# Patient Record
Sex: Female | Born: 1965 | Race: White | Hispanic: No | Marital: Single | Smoking: Never smoker
Health system: Southern US, Community
[De-identification: ages and names within clinical notes are randomized; demographics above are authoritative.]

## PROBLEM LIST (undated history)

## (undated) DIAGNOSIS — D649 Anemia, unspecified: Secondary | ICD-10-CM

## (undated) DIAGNOSIS — F419 Anxiety disorder, unspecified: Secondary | ICD-10-CM

## (undated) DIAGNOSIS — I639 Cerebral infarction, unspecified: Secondary | ICD-10-CM

## (undated) DIAGNOSIS — F410 Panic disorder [episodic paroxysmal anxiety] without agoraphobia: Secondary | ICD-10-CM

## (undated) DIAGNOSIS — F32A Depression, unspecified: Secondary | ICD-10-CM

## (undated) DIAGNOSIS — Z8489 Family history of other specified conditions: Secondary | ICD-10-CM

## (undated) DIAGNOSIS — E559 Vitamin D deficiency, unspecified: Secondary | ICD-10-CM

## (undated) DIAGNOSIS — M797 Fibromyalgia: Secondary | ICD-10-CM

## (undated) DIAGNOSIS — G4700 Insomnia, unspecified: Secondary | ICD-10-CM

## (undated) DIAGNOSIS — F329 Major depressive disorder, single episode, unspecified: Secondary | ICD-10-CM

## (undated) DIAGNOSIS — I10 Essential (primary) hypertension: Secondary | ICD-10-CM

## (undated) DIAGNOSIS — R5382 Chronic fatigue, unspecified: Secondary | ICD-10-CM

## (undated) DIAGNOSIS — F5104 Psychophysiologic insomnia: Secondary | ICD-10-CM

## (undated) HISTORY — DX: Fibromyalgia: M79.7

## (undated) HISTORY — DX: Panic disorder (episodic paroxysmal anxiety): F41.0

## (undated) HISTORY — PX: PARATHYROIDECTOMY: SHX19

## (undated) HISTORY — DX: Vitamin D deficiency, unspecified: E55.9

## (undated) HISTORY — PX: ABDOMINAL HYSTERECTOMY: SHX81

## (undated) HISTORY — DX: Chronic fatigue, unspecified: R53.82

## (undated) HISTORY — DX: Cerebral infarction, unspecified: I63.9

## (undated) HISTORY — DX: Anxiety disorder, unspecified: F41.9

## (undated) HISTORY — DX: Psychophysiologic insomnia: F51.04

## (undated) HISTORY — DX: Insomnia, unspecified: G47.00

## (undated) HISTORY — PX: PARTIAL HYSTERECTOMY: SHX80

---

## 1998-05-08 ENCOUNTER — Other Ambulatory Visit: Admission: RE | Admit: 1998-05-08 | Discharge: 1998-05-08 | Payer: Self-pay | Admitting: Obstetrics & Gynecology

## 2002-06-14 ENCOUNTER — Emergency Department (HOSPITAL_COMMUNITY): Admission: EM | Admit: 2002-06-14 | Discharge: 2002-06-15 | Payer: Self-pay | Admitting: Emergency Medicine

## 2007-01-15 ENCOUNTER — Ambulatory Visit: Payer: Self-pay

## 2007-02-02 ENCOUNTER — Ambulatory Visit: Payer: Self-pay | Admitting: Internal Medicine

## 2007-02-03 ENCOUNTER — Ambulatory Visit: Payer: Self-pay | Admitting: Internal Medicine

## 2007-03-06 ENCOUNTER — Ambulatory Visit: Payer: Self-pay | Admitting: Internal Medicine

## 2007-04-05 ENCOUNTER — Ambulatory Visit: Payer: Self-pay | Admitting: Internal Medicine

## 2007-05-06 ENCOUNTER — Ambulatory Visit: Payer: Self-pay | Admitting: Internal Medicine

## 2007-06-05 ENCOUNTER — Ambulatory Visit: Payer: Self-pay | Admitting: Internal Medicine

## 2007-07-06 ENCOUNTER — Ambulatory Visit: Payer: Self-pay | Admitting: Internal Medicine

## 2007-08-06 ENCOUNTER — Ambulatory Visit: Payer: Self-pay | Admitting: Internal Medicine

## 2007-09-05 ENCOUNTER — Ambulatory Visit: Payer: Self-pay | Admitting: Internal Medicine

## 2007-10-06 ENCOUNTER — Ambulatory Visit: Payer: Self-pay | Admitting: Internal Medicine

## 2007-10-10 ENCOUNTER — Ambulatory Visit: Payer: Self-pay | Admitting: Unknown Physician Specialty

## 2007-10-16 ENCOUNTER — Inpatient Hospital Stay: Payer: Self-pay | Admitting: Unknown Physician Specialty

## 2007-11-05 ENCOUNTER — Ambulatory Visit: Payer: Self-pay | Admitting: Internal Medicine

## 2007-12-06 ENCOUNTER — Ambulatory Visit: Payer: Self-pay | Admitting: Internal Medicine

## 2009-01-05 ENCOUNTER — Ambulatory Visit: Payer: Self-pay | Admitting: Internal Medicine

## 2009-01-15 ENCOUNTER — Ambulatory Visit: Payer: Self-pay | Admitting: Internal Medicine

## 2009-02-02 ENCOUNTER — Ambulatory Visit: Payer: Self-pay | Admitting: Internal Medicine

## 2009-03-05 ENCOUNTER — Ambulatory Visit: Payer: Self-pay | Admitting: Internal Medicine

## 2009-04-04 ENCOUNTER — Ambulatory Visit: Payer: Self-pay | Admitting: Internal Medicine

## 2010-01-13 ENCOUNTER — Ambulatory Visit: Payer: Self-pay | Admitting: Family Medicine

## 2010-04-28 ENCOUNTER — Ambulatory Visit: Payer: Self-pay | Admitting: Unknown Physician Specialty

## 2010-06-14 ENCOUNTER — Ambulatory Visit: Payer: Self-pay | Admitting: Family Medicine

## 2010-06-29 ENCOUNTER — Encounter: Payer: Self-pay | Admitting: Family Medicine

## 2010-07-05 ENCOUNTER — Encounter: Payer: Self-pay | Admitting: Family Medicine

## 2010-08-05 ENCOUNTER — Encounter: Payer: Self-pay | Admitting: Family Medicine

## 2011-01-28 ENCOUNTER — Other Ambulatory Visit: Payer: Self-pay | Admitting: *Deleted

## 2011-01-28 ENCOUNTER — Ambulatory Visit
Admission: RE | Admit: 2011-01-28 | Discharge: 2011-01-28 | Disposition: A | Discharge status: Home / Self Care | Payer: 59 | Source: Ambulatory Visit | Attending: *Deleted | Admitting: *Deleted

## 2011-01-28 DIAGNOSIS — M545 Low back pain, unspecified: Secondary | ICD-10-CM

## 2011-01-28 DIAGNOSIS — M797 Fibromyalgia: Secondary | ICD-10-CM

## 2011-01-28 DIAGNOSIS — M542 Cervicalgia: Secondary | ICD-10-CM

## 2012-01-14 ENCOUNTER — Ambulatory Visit: Payer: Self-pay

## 2012-01-30 ENCOUNTER — Ambulatory Visit: Payer: Self-pay

## 2012-02-13 ENCOUNTER — Ambulatory Visit: Payer: Self-pay

## 2012-02-23 ENCOUNTER — Emergency Department: Payer: Self-pay | Admitting: Emergency Medicine

## 2012-02-23 LAB — COMPREHENSIVE METABOLIC PANEL
Albumin: 4.1 g/dL (ref 3.4–5.0)
Alkaline Phosphatase: 60 U/L (ref 50–136)
BUN: 9 mg/dL (ref 7–18)
Creatinine: 0.7 mg/dL (ref 0.60–1.30)
EGFR (African American): >60
Glucose: 116 mg/dL — ABNORMAL HIGH (ref 65–99)
SGOT(AST): 17 U/L (ref 15–37)
SGPT (ALT): 26 U/L
Sodium: 134 mmol/L — ABNORMAL LOW (ref 136–145)
Total Protein: 9.3 g/dL — ABNORMAL HIGH (ref 6.4–8.2)

## 2012-02-23 LAB — URINALYSIS, COMPLETE
Bilirubin,UR: NEGATIVE
Nitrite: NEGATIVE
Ph: 5 (ref 4.5–8.0)
Protein: 100
RBC,UR: <1 /HPF (ref 0–5)
WBC UR: <1 /HPF (ref 0–5)

## 2012-02-23 LAB — CBC
MCH: 24 pg — ABNORMAL LOW (ref 26.0–34.0)
MCHC: 32.5 g/dL (ref 32.0–36.0)
MCV: 74 fL — ABNORMAL LOW (ref 80–100)
Platelet: 455 10*3/uL — ABNORMAL HIGH (ref 150–440)
RBC: 5.43 10*6/uL — ABNORMAL HIGH (ref 3.80–5.20)

## 2012-03-12 ENCOUNTER — Ambulatory Visit: Payer: Self-pay

## 2012-12-31 ENCOUNTER — Ambulatory Visit: Payer: Self-pay | Admitting: Family Medicine

## 2013-10-28 ENCOUNTER — Ambulatory Visit: Payer: Self-pay | Admitting: Family Medicine

## 2013-11-04 ENCOUNTER — Ambulatory Visit: Payer: Self-pay | Admitting: Family Medicine

## 2014-01-02 ENCOUNTER — Ambulatory Visit: Payer: Self-pay | Admitting: Family Medicine

## 2014-04-22 ENCOUNTER — Inpatient Hospital Stay: Payer: Self-pay | Admitting: Internal Medicine

## 2014-04-22 LAB — CBC WITH DIFFERENTIAL/PLATELET
BASOS ABS: 0.1 10*3/uL (ref 0.0–0.1)
Basophil %: 1.2 %
EOS ABS: 0.7 10*3/uL (ref 0.0–0.7)
Eosinophil %: 8.7 %
HCT: 38.7 % (ref 35.0–47.0)
HGB: 12.6 g/dL (ref 12.0–16.0)
LYMPHS ABS: 1.1 10*3/uL (ref 1.0–3.6)
Lymphocyte %: 13.6 %
MCH: 26.6 pg (ref 26.0–34.0)
MCHC: 32.6 g/dL (ref 32.0–36.0)
MCV: 82 fL (ref 80–100)
MONOS PCT: 7.2 %
Monocyte #: 0.6 x10 3/mm (ref 0.2–0.9)
NEUTROS ABS: 5.4 10*3/uL (ref 1.4–6.5)
NEUTROS PCT: 69.3 %
PLATELETS: 294 10*3/uL (ref 150–440)
RBC: 4.74 10*6/uL (ref 3.80–5.20)
RDW: 15.4 % — ABNORMAL HIGH (ref 11.5–14.5)
WBC: 7.8 10*3/uL (ref 3.6–11.0)

## 2014-04-22 LAB — BASIC METABOLIC PANEL
Anion Gap: 4 — ABNORMAL LOW (ref 7–16)
BUN: 9 mg/dL (ref 7–18)
CALCIUM: 10.7 mg/dL — AB (ref 8.5–10.1)
CHLORIDE: 103 mmol/L (ref 98–107)
CO2: 30 mmol/L (ref 21–32)
Creatinine: 0.73 mg/dL (ref 0.60–1.30)
Glucose: 115 mg/dL — ABNORMAL HIGH (ref 65–99)
OSMOLALITY: 273 (ref 275–301)
POTASSIUM: 3.5 mmol/L (ref 3.5–5.1)
Sodium: 137 mmol/L (ref 136–145)

## 2014-04-22 LAB — TROPONIN I

## 2014-04-23 DIAGNOSIS — I517 Cardiomegaly: Secondary | ICD-10-CM

## 2014-04-23 LAB — TSH: THYROID STIMULATING HORM: 1.52 u[IU]/mL

## 2014-04-23 LAB — LIPID PANEL
Cholesterol: 151 mg/dL (ref 0–200)
HDL: 68 mg/dL — AB (ref 40–60)
Ldl Cholesterol, Calc: 65 mg/dL (ref 0–100)
Triglycerides: 88 mg/dL (ref 0–200)
VLDL Cholesterol, Calc: 18 mg/dL (ref 5–40)

## 2014-04-23 LAB — HEMOGLOBIN A1C: HEMOGLOBIN A1C: 6.1 % (ref 4.2–6.3)

## 2014-04-23 LAB — SEDIMENTATION RATE: Erythrocyte Sed Rate: 3 mm/hr (ref 0–20)

## 2014-04-24 ENCOUNTER — Encounter: Payer: Self-pay | Admitting: Cardiovascular Disease

## 2014-04-24 DIAGNOSIS — I6350 Cerebral infarction due to unspecified occlusion or stenosis of unspecified cerebral artery: Secondary | ICD-10-CM

## 2014-04-24 DIAGNOSIS — I10 Essential (primary) hypertension: Secondary | ICD-10-CM

## 2014-04-25 ENCOUNTER — Telehealth: Payer: Self-pay

## 2014-04-25 NOTE — Telephone Encounter (Signed)
Attempted to call pt to change appt to Dr. Graciela Husbands, no answer

## 2014-04-25 NOTE — Telephone Encounter (Signed)
Message copied by Coralee Rud on Fri Apr 25, 2014  4:33 PM ------      Message from: Lorine Bears A      Created: Thu Apr 24, 2014  4:29 PM       She is scheduled to see me for follow up. Please change that to Dr. Graciela Husbands as a consult. She had a stroke. Neurologist recommended a loop recorder to evaluate for A-fib.  ------

## 2014-04-29 NOTE — Telephone Encounter (Signed)
Na to change appt to Dr. Graciela Husbands.

## 2014-05-01 ENCOUNTER — Telehealth: Payer: Self-pay | Admitting: *Deleted

## 2014-05-01 NOTE — Telephone Encounter (Signed)
LVM 5/28

## 2014-05-01 NOTE — Telephone Encounter (Signed)
Patient stated she does not with to be seen at this time  She stated that she would call us if she wanted appt

## 2014-05-01 NOTE — Telephone Encounter (Signed)
Please call patient to explain why she needs to see Dr. Graciela Husbands.  In the hospital the doctor told her that her heart was good.

## 2014-05-02 ENCOUNTER — Encounter: Payer: 59 | Admitting: Cardiovascular Disease

## 2014-06-25 ENCOUNTER — Telehealth: Payer: Self-pay

## 2014-06-25 NOTE — Telephone Encounter (Signed)
Pt is confused on why she needs to see Dr. Graciela HusbandsKlein, states she was told in the hospital her heart was fine. Please call.

## 2014-06-26 NOTE — Telephone Encounter (Signed)
Spoke w/ Victoria Guzman.  [place] and wanted to make sure that she has all of the correct info when she transitions care.  She states that she will not wear any type of monitor, as they are bulky, she has worn them before and she cannot wear her sundresses b/c of the "burn marks" they leave on her skin.  She states that she will not wear a loop recorder, as she does not want anything injected into her or have any kind of surgery.  She states that Dr. Kirke CorinArida told her that her heart was fine and she feels that the other doctors are trying to add more tests and meds than she needs.  Victoria Guzman states that our office cancelled her TCM appt w/ Dr. Kirke CorinArida and that she feels she needs to talk to him before she moves out of the state on 07/19/14. She would like to make an appt, but wants to make sure that there will be no charge for this appt, as it should be considered lumped w/ her hospital visit, b/c our office cancelled the appt.  Please advise and let Victoria Guzman know if she can get an appt to see Dr.Arida before the moves.

## 2014-06-26 NOTE — Telephone Encounter (Signed)
Left message for pt to call back.  [facility] w/ instructions to f/u w/ Dr. Kirke CorinArida for TCM, which pt did not show for.  Dr. Jari SportsmanArida's consultation indicates that he would pt to have a loop recorder to determine if pt has underlying tachycardia, but no appt was sched to see Dr. Graciela HusbandsKlein.

## 2014-07-17 ENCOUNTER — Encounter: Payer: Self-pay | Admitting: Cardiovascular Disease

## 2014-07-17 ENCOUNTER — Ambulatory Visit (INDEPENDENT_AMBULATORY_CARE_PROVIDER_SITE_OTHER): Payer: Medicare HMO | Admitting: Cardiovascular Disease

## 2014-07-17 VITALS — BP 180/120 | HR 104 | Ht 66.0 in | Wt 213.2 lb

## 2014-07-17 DIAGNOSIS — I6340 Cerebral infarction due to embolism of unspecified cerebral artery: Secondary | ICD-10-CM

## 2014-07-17 DIAGNOSIS — I639 Cerebral infarction, unspecified: Secondary | ICD-10-CM | POA: Insufficient documentation

## 2014-07-17 DIAGNOSIS — R0602 Shortness of breath: Secondary | ICD-10-CM

## 2014-07-17 NOTE — Patient Instructions (Signed)
Your physician recommends that you schedule a follow-up appointment in:  As needed  Your physician recommends that you continue on your current medications as directed. Please refer to the Current Medication list given to you today.  

## 2014-07-17 NOTE — Assessment & Plan Note (Signed)
Transthoracic echo and TEE were both unremarkable. I agree with evaluation for possible paroxysmal atrial fibrillation as a source. I recommended either a 30 day outpatient telemetry or a loop recorder . The patient did not want to have any of the 2 test as [place] to [place] in the near future. I advised him to establish with a primary care physician today. Blood pressure has been running high but according to her is on the high when she's under stress. Continue to monitor and consider treatment.

## 2014-07-17 NOTE — Progress Notes (Signed)
Primary care physician: Dr. Thana AtesMorrisey  HPI  This is a 48 year old African American female who is here today for a followup visit after recent hospitalization. She has no history of fibromyalgia, chronic pain syndrome and elevated blood pressure. She presented in May with right-sided numbness and weakness. She was significantly hypertensive on presentation with blood pressure of 200/82. MRI showed a small left MCA stroke which was suspected to be embolic in etiology. She underwent a transesophageal echocardiogram which showed no cardiac source of embolism. CT angiogram showed high-grade focal stenosis in the distal left MCA. Workup for possible paroxysmal atrial fibrillation was recommended. She denies any chest pain or significant dyspnea. No palpitations or previous history of arrhythmia. She has been under significant stress lately. She might be moving to [place] in the near future. EKG showed sinus tachycardia. Telemetry showed no evidence of arrhythmia. Echocardiogram showed normal LV systolic function with ejection fraction of 60-65%, mild to moderate left ventricular hypertrophy and grade 1 diastolic dysfunction.  Allergies  Allergen Reactions  . Quinolones     Other reaction(s): Unknown     No current outpatient prescriptions on file prior to visit.   No current facility-administered medications on file prior to visit.     Past Medical History  Diagnosis Date  . Stroke   . Fibromyalgia   . Chronic fatigue   . Anxiety   . Insomnia   . Panic attack      Past Surgical History  Procedure Laterality Date  . Partial hysterectomy       Family History  Problem Relation Age of Onset  . Diabetes Mother      History   Social History  . Marital Status: Married    Spouse Name: N/A    Number of Children: N/A  . Years of Education: N/A   Occupational History  . Not on file.   Social History Main Topics  . Smoking status: Never Smoker   . Smokeless tobacco: Not on file    . Alcohol Use: Yes  . Drug Use: No  . Sexual Activity: Not on file   Other Topics Concern  . Not on file   Social History Narrative  . No narrative on file      PHYSICAL EXAM   BP 180/120  Pulse 104  Ht 5\' 6"  (1.676 m)  Wt 213 lb 4 oz (96.73 kg)  BMI 34.44 kg/m2 Constitutional: She is oriented to person, place, and time. She appears well-developed and well-nourished. No distress.  HENT: No nasal discharge.  Head: Normocephalic and atraumatic.  Eyes: Pupils are equal and round. No discharge.  Neck: Normal range of motion. Neck supple. No JVD present. No thyromegaly present.  Cardiovascular: Normal rate, regular rhythm, normal heart sounds. Exam reveals no gallop and no friction rub. There is a 1/6 systolic ejection murmur at the aortic area Pulmonary/Chest: Effort normal and breath sounds normal. No stridor. No respiratory distress. She has no wheezes. She has no rales. She exhibits no tenderness.  Abdominal: Soft. Bowel sounds are normal. She exhibits no distension. There is no tenderness. There is no rebound and no guarding.  Musculoskeletal: Normal range of motion. She exhibits no edema and no tenderness.  Neurological: She is alert and oriented to person, place, and time. Coordination normal.  Skin: Skin is warm and dry. No rash noted. She is not diaphoretic. No erythema. No pallor.  Psychiatric: She has a normal mood and affect. Her behavior is normal. Judgment and thought content normal.  LKG:MWNUU  Tachycardia  WITHIN NORMAL LIMITS   ASSESSMENT AND PLAN

## 2014-10-31 ENCOUNTER — Ambulatory Visit: Payer: Self-pay

## 2015-01-30 ENCOUNTER — Ambulatory Visit: Payer: Self-pay | Admitting: Family Medicine

## 2015-03-28 NOTE — Consult Note (Signed)
Referring Physician:  Alba Destine :   Primary Care Physician:  Alba Destine : [facility], [address], [place], [place] [address], [place] [phone]  Reason for Consult: Admit Date: 15-Apr-2014  Chief Complaint: R sided weakness  Reason for Consult: transient neurologic deficit   History of Present Illness: History of Present Illness:   49 yo RHD F presents to [facility] secondary to sudden onset of R sided weakness and numbness as well as slurred speech.  This lasted for about 10 minutes and went away.  Pt had two previous episodes like this but is not able to express how much.  Pt denies headache, loss of conciousness or vision changes.    ROS:  General denies complaints   HEENT no complaints   Lungs no complaints   Cardiac no complaints   GI no complaints   GU no complaints   Musculoskeletal no complaints   Extremities no complaints   Skin no complaints   Neuro no complaints   Endocrine no complaints   Psych no complaints   Past Medical/Surgical Hx:  Chronic Fatigue:   Panic Attacks:   Anxiety:   Fibromyalgia:   Depression:   anemia:   heart murmur:   Past Medical/ Surgical Hx:  Past Medical History as above   Past Surgical History as above   Home Medications: Medication Instructions Last Modified Date/Time  ProAir HFA CFC free 90 mcg/inh inhalation aerosol 2 puff(s) inhaled 4 times a day, As Needed 19-May-15 16:03  cyclobenzaprine 10 mg oral tablet 1 tab(s) orally 2 times a day, As Needed 19-May-15 16:03  zolpidem 10 mg oral tablet 1.5 tab(s) orally once a day (at bedtime) 19-May-15 16:03  clonazePAM 2 mg oral tablet 0.5 tab(s) orally 2 times a day, As Needed and 1 tab at bedtime 19-May-15 16:03   Allergies:  Avelox: Hives  Allergies:  Allergies NKDA   Social/Family History: Employment Status: currently employed  Lives With: significant other  Living Arrangements: house  Social History: no tob, no EtOH, no  illicits  Family History: no stroke, no seizures   Vital Signs: **Vital Signs.:   19-May-15 20:22  Vital Signs Type Routine  Temperature Temperature (F) 98.5  Celsius 36.9  Temperature Source oral  Pulse Pulse 86  Respirations Respirations 18  Systolic BP Systolic BP 466  Diastolic BP (mmHg) Diastolic BP (mmHg) 86  Mean BP 114  Pulse Ox % Pulse Ox % 100  Pulse Ox Activity Level  At rest  Oxygen Delivery Room Air/ 21 %   Physical Exam: General: slightly overweight, flat affect but no acute distress  HEENT: normocephalic, sclera nonicteric, oropharynx clear  Neck: supple, no JVD, no bruits  Chest: CTA B, no wheezing, good movement  Cardiac: RRR, no murmurs, no edema, 2+ pulses  Extremities: no C/C/E, FROM   Neurologic Exam: Mental Status: alert and oriented x 3, normal speech and language, follows complex commands  Cranial Nerves: PERRLA, EOMI, nl VF, face symmetric, tongue midline, shoulder shrug equal  Motor Exam: 5/5 B normal, tone, no tremor  Deep Tendon Reflexes: 2+/4 B, plantars downgoing B, no Hoffman  Sensory Exam: pinprick, temperature, and vibration intact B  Coordination: FTN and HTS WNL   Lab Results: Routine Chem:  19-May-15 12:08   Glucose, Serum  115  BUN 9  Creatinine (comp) 0.73  Sodium, Serum 137  Potassium, Serum 3.5  Chloride, Serum 103  CO2, Serum 30  Calcium (Total), Serum  10.7  Anion Gap  4  Osmolality (calc) 273  eGFR (African American) >60  eGFR (Non-African American) >60 (eGFR values <43m/min/1.73 m2 may be an indication of chronic kidney disease (CKD). Calculated eGFR is useful in patients with stable renal function. The eGFR calculation will not be reliable in acutely ill patients when serum creatinine is changing rapidly. It is not useful in  patients on dialysis. The eGFR calculation may not be applicable to patients at the low and high extremes of body sizes, pregnant women, and vegetarians.)  Cardiac:  19-May-15 12:08    Troponin I < 0.02 (0.00-0.05 0.05 ng/mL or less: NEGATIVE  Repeat testing in 3-6 hrs  if clinically indicated. >0.05 ng/mL: POTENTIAL  MYOCARDIAL INJURY. Repeat  testing in 3-6 hrs if  clinically indicated. NOTE: An increase or decrease  of 30% or more on serial  testing suggests a  clinically important change)  Routine Hem:  19-May-15 12:08   WBC (CBC) 7.8  RBC (CBC) 4.74  Hemoglobin (CBC) 12.6  Hematocrit (CBC) 38.7  Platelet Count (CBC) 294  MCV 82  MCH 26.6  MCHC 32.6  RDW  15.4  Neutrophil % 69.3  Lymphocyte % 13.6  Monocyte % 7.2  Eosinophil % 8.7  Basophil % 1.2  Neutrophil # 5.4  Lymphocyte # 1.1  Monocyte # 0.6  Eosinophil # 0.7  Basophil # 0.1 (Result(s) reported on 22 Apr 2014 at 12:39PM.)   Radiology Results: MRI:    19-May-15 17:33, MRI Brain Without Contrast  MRI Brain Without Contrast   REASON FOR EXAM:    RUE weakness and slurred speech  COMMENTS:       PROCEDURE: MR  - MR BRAIN WO CONTRAST  - Apr 22 2014  5:33PM     CLINICAL DATA:  Right arm and leg weakness.  Slurred speech.    EXAM:  MRI HEAD WITHOUT CONTRAST    TECHNIQUE:  Multiplanar, multiecho pulse sequences of the brain and surrounding  structures were obtained without intravenous contrast.    COMPARISON:  CT 04/22/2014  FINDINGS:  Small areas of restricted diffusion in the left parietal lobe  consistent with acute infarct. There are 2 small areas of restricted  diffusion measuring approximately 5 mm each.    Ventricle size is normal. Chronic infarct in the left caudate.  Brainstem and cerebellum are normal.    Negative for hemorrhage or mass lesion.    Mucosal thickening in the paranasal sinuses.     IMPRESSION:  Small areas of acute infarct in the left parietal cortex.  Chronic infarct in the left caudate.      Electronically Signed    By: CFranchot GalloM.D.    On: 04/22/2014 17:57         Verified By: DTruett Perna M.D.,  CT:    19-May-15 14:41, CT Head  Without Contrast  CT Head Without Contrast   REASON FOR EXAM:    tia like sx  COMMENTS:       PROCEDURE: CT  - CT HEAD WITHOUT CONTRAST  - Apr 22 2014  2:41PM     CLINICAL DATA:  TIA symptoms    EXAM:  CT HEAD WITHOUT CONTRAST    TECHNIQUE:  Contiguous axial images were obtained from the base of the skull  through the vertex without intravenous contrast.    COMPARISON:  None.  FINDINGS:  There is no acute intracranial hemorrhage or infarct. No mass lesion  or midline shift. Gray-white matter differentiation is well  maintained. Ventricles are normal in size without  evidence of  hydrocephalus. CSF containing spaces are within normal limits. No  extra-axial fluid collection.    The calvarium is intact.    Orbital soft tissues are within normal limits.    Mild mucoperiosteal thickening present within the partially  visualized sphenoid sinuses. Paranasal sinuses are otherwise clear.  No mastoid effusion.  Scalp soft tissues are unremarkable.     IMPRESSION:  Normal head CT with no acute intracranial abnormality identified.      Electronically Signed    By: Jeannine Boga M.D.    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  The fact that pt has had two previous episodes makes [place] wonder if there is some type of intracranial stenosis or other extracranial stenosis.  A small seizure can cause DWI changes and cause episodes that are similar as well.  Elevated glucose-  probable DM, pending Hem A1c HTN-   elevated will get official  review of MRI again with radiology preferably a neuroradiologist CTA of head and neck echo pending pending B12, Hem A1c, lipids, TSH will likely need hypercoaguable w/u as well will follow  Electronic Signatures: Jamison Neighbor (MD)  (Signed 19-May-15 23:19)  Authored: REFERRING PHYSICIAN, Primary Care Physician, Consult, History of Present Illness, Review of Systems, PAST MEDICAL/SURGICAL HISTORY, HOME MEDICATIONS, ALLERGIES, Social/Family History, NURSING VITAL SIGNS, Physical Exam-, LAB RESULTS, RADIOLOGY RESULTS, Recommendations   Last Updated: 19-May-15 23:19 by Jamison Neighbor (MD)

## 2015-03-28 NOTE — Consult Note (Signed)
PATIENT NAME:  Victoria Guzman, Victoria Guzman MR#:  [id number] DATE OF BIRTH:  [birth year]  CARDIAC CONSULTATION    DATE OF CONSULTATION:  04/24/2014  REFERRING PHYSICIAN:  Enid Baasadhika Kalisetti, MD CONSULTING PHYSICIAN:  Mariany Mackintosh A. Kirke CorinArida, MD  PRIMARY CARE PHYSICIAN: Dennison MascotLemont Morrisey, MD  REASON FOR CONSULTATION: Suspected cardioembolic stroke, needs TEE.   HISTORY OF PRESENT ILLNESS: This is a 49 year old African-American female with history of fibromyalgia, chronic pain syndrome and possible hypertension. She has no previous cardiac history. She presented on the 19th with right-sided numbness and weakness. She was hypertensive on presentation with blood pressure of 201/82. MRI showed a small left MCA stroke, which is suspected to be embolic in etiology. She was seen by neurologist, Dr. Katrinka BlazingSmith, who recommended at TEE and evaluation for atrial fibrillation. The patient denies any chest pain or significant dyspnea. She has no history of documented arrhythmia and no palpitations. Transthoracic echocardiogram was overall unremarkable except for left ventricular hypertrophy.   PAST MEDICAL HISTORY:  1. Chronic fatigue.  2. Fibromyalgia.  3. Chronic pain.  4. Depression.   SOCIAL HISTORY: Negative for smoking, alcohol or recreational drug use.   ALLERGIES: INCLUDE AVELOX.   HOME MEDICATIONS: Include Abilify, Ambien, Cymbalta, Duragesic, Mobic, trazodone and Zofran.   FAMILY HISTORY: Remarkable for hypertension, but no premature coronary artery disease.   REVIEW OF SYSTEMS: A 10-point review of systems was performed. It is negative other than what is mentioned in the HPI.   PHYSICAL EXAMINATION:  GENERAL: The patient appears to be at her stated age, in no acute distress.  VITAL SIGNS: Temperature is 98, pulse 51. She is going into sinus tachycardia occasionally with a heart rate of 110 to 112, oxygen saturation is 95% on room air.  HEENT: Normocephalic, atraumatic.  NECK: No JVD or carotid bruits.   RESPIRATORY: Normal respiratory effort with no use of accessory muscles. Auscultation reveals normal breath sounds.  CARDIOVASCULAR: Normal PMI. Normal S1 and S2 with no gallops. There is a 2/6 systolic ejection murmur at the base of the heart.  ABDOMEN: Benign, nontender, nondistended.  EXTREMITIES: Wit no clubbing, cyanosis or edema.  SKIN: Warm and dry with no rash.  PSYCHIATRIC: She is alert, oriented x3, with normal mood and affect.   LABORATORY AND DIAGNOSTIC DATA: ECG showed normal sinus rhythm. Telemetry shows no evidence of arrhythmia other than sinus tachycardia. Labs overall were unremarkable. Echocardiogram showed normal LV systolic function with mild to moderate left ventricular hypertrophy and grade 1 diastolic dysfunction. No significant valvular abnormalities.   IMPRESSION:  1. 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  2. Hypertension.   RECOMMENDATIONS: The patient had what seems to be a cardioembolic stroke of unclear source at the present time. Transthoracic echocardiogram did not reveal a cardiac source of embolism. Due to that, I agree with transesophageal echocardiogram. I discussed in detail the procedure, risks and benefits. If TEE does not show significant structural heart abnormalities, an outpatient loop recorder placement is reasonable to exclude underlying atrial fibrillation.   ____________________________ Chelsea AusMuhammad A. Kirke CorinArida, MD maa:lb D: 04/24/2014 08:10:28 ET T: 04/24/2014 09:06:44 ET JOB#: [id number]  cc: Jerolyn CenterMuhammad A. Kirke CorinArida, MD, <Dictator> Dennison MascotLemont Morrisey, MD Enid Baasadhika Kalisetti, MD [facility] [place] DonovanA Raylin Winer MD ELECTRONICALLY SIGNED 05/13/2014 17:08

## 2015-03-28 NOTE — H&P (Signed)
PATIENT NAME:  Victoria Guzman, Victoria Guzman MR#:  [id number] DATE OF BIRTH:  [birth year]  DATE OF ADMISSION:  04/22/2014  PRIMARY CARE PHYSICIAN: Dennison MascotLemont Morrisey, MD  CHIEF COMPLAINT: Right upper and lower extremity weakness, numbness, and slurred speech.   HISTORY OF PRESENT ILLNESS: This is a 49 year old African American female patient with history of fibromyalgia, chronic pain syndrome, presented to the hospital complaining of acute onset of right upper extremity and lower extremity weakness, numbness. The patient was trying to text her chiropractor. She was with a friend. She noticed that she felt extremely limp, and her right upper extremity and lower extremity felt like jelly, numb, weak. Also her friend suggested that she drink some water to see how she does and she could not hold the water in her mouth, had some slurred speech, and presented to the ER. The patient had milder symptoms 2 weeks prior and a mild episode of slurred speech 1 month prior. Here in the Emergency Room, the patient'Guzman symptoms have resolved. Her CT scan of the head shows no acute stroke. Blood pressure is elevated at 201/82. The patient is being admitted to the hospitalist service for TIA secondary to recurrent episodes in the last month.   The patient does not have any history of strokes. She mentioned that she gained about 25 pounds recently, and her blood pressure is up into systolics of up to 160 with her primary care physician, and she was supposed to go onto weight loss pill, as drop in weight loss will cause drop in her blood pressure too. Has not been treated for blood pressure. Does not take aspirin. No statin.   PAST MEDICAL HISTORY: 1.  Chronic fatigue.  2.  Fibromyalgia.  3.  Chronic pain.  4.  Depression.   SOCIAL HISTORY: The patient does not smoke. No alcohol. No illicit drugs. Ambulates on her own.   ALLERGIES: AVELOX.   FAMILY HISTORY: Hypertension.   REVIEW OF SYSTEMS:    CONSTITUTIONAL: Complains of some  fatigue.  PSYCHIATRIC: No blurred vision, pain, redness. EARS, NOSE, THROAT: No tinnitus, ear pain, hearing loss.  RESPIRATORY: No cough, wheeze, hemoptysis.  CARDIOVASCULAR: No chest pain, orthopnea, edema.  GASTROINTESTINAL: No nausea, vomiting, diarrhea, abdominal pain.  GENITOURINARY: No dysuria, hematuria, frequency.  ENDOCRINE: No polyuria, nocturia, thyroid problems.  Hematologic And Lymphatic: No anemia, easy bruising or bleeding.  INTEGUMENTARY: No acne, rash, lesion.  MUSCULOSKELETAL: No back pain. Has fibromyalgia.  NEUROLOGIC: No focal numbness, weakness.  PSYCHIATRIC: Depression.   HOME MEDICATIONS: 1.  Abilify 20 mg daily.  2.  Ambien CR once a day at bedtime, 12.5 mg. 3.  Ativan 2 mg every night, 1/2 tablet up to twice a day.  4.  Cymbalta 60 mg 2 times a day.  5.  Duragesic 25 one patch transdermal daily.  7.  Mobic 15 mg oral once a day.  8.  Phenadoz 25 mg rectal every 8 hours as needed for nausea.  9.  Trazodone 100 mg once a day.  10.  Zofran ODT 8 mg orally 3 times a day as needed.   PHYSICAL EXAMINATION: VITAL SIGNS: Temperature 97.8, pulse 103, blood pressure 201/82, saturating 100% on room air.  GENERAL: Obese African American female patient lying in bed, seems comfortable, conversational, cooperative with exam.  PSYCHIATRIC: Alert, oriented x 3, anxious. HEENT: Atraumatic, normocephalic. Oral mucosa moist and pink. External ears and nose normal. No pallor. No icterus. Pupils bilaterally equal and reactive to light.  NECK: Supple. No thyromegaly or palpable lymph  nodes. Trachea midline. No carotid bruit, JVD.  CARDIOVASCULAR: S1, S2, without any murmurs. Peripheral pulses 2+. No edema.  RESPIRATORY: Normal work of breathing. Clear to auscultation on both sides.  GASTROINTESTINAL: Soft abdomen, nontender. Bowel signs present. No hepatosplenomegaly palpable. GENITOURINARY: No CVA tenderness or bladder distention.  SKIN: Warm and dry. No petechiae, rash,  ulcers.  MUSCULOSKELETAL: No joint swelling, redness of the large joints. Normal muscle tone.  NEUROLOGICAL: Motor strength 5/5 in upper and lower extremities. Sensation to fine touch intact all over. Reflexes 2+ all over. Babinski'Guzman downgoing. Cranial nerves II through XII intact.   LABORATORY, DIAGNOSTIC, AND RADIOLOGICAL DATA: Glucose of 115, BUN 9, creatinine 0.73, sodium 137, potassium 3.5, chloride 103. Troponin less than 0.02. WBC 7.8, hemoglobin 12.6, platelets of 294.   CT scan of the head without contrast shows normal CT head without any acute abnormalities.   EKG shows sinus tachycardia. No acute changes.   ASSESSMENT AND PLAN: 1.  Transient ischemic attack with symptoms of right-sided weakness, numbness, and slurred speech. Suggests left middle cerebral artery territory. The patient'Guzman ABCD score seems to be low, but her symptoms have been recurrent with this being the third episode and the worst in the last months. Will admit to the hospital under observation onto a tele floor. Will get an MRI, carotid Doppler, and echocardiogram. Start on aspirin, statin. Check fasting lipid profile in the morning. Consult neurology. Symptoms have resolved. Will not need any speech or physical therapy. Further management as per test results.  2.  Hypertension. The patient has not been treated for her hypertension, as she has been waiting to lose weight to see if her blood pressure responds to weight loss. Will put her on labetalol p.r.n. at this time and start her on hydrochlorothiazide.  3.  Fibromyalgia, chronic pain. Continue home medications.  4.  Deep venous thrombosis prophylaxis with Lovenox.   CODE STATUS: Full code.   TIME SPENT TODAY ON THIS CASE: 45 minutes.    ____________________________ Molinda Bailiff Sharise Lippy, MD srs:jcm D: 04/22/2014 16:01:57 ET T: 04/22/2014 16:23:34 ET JOB#: [id number]  cc: Wardell Heath R. Elpidio Anis, MD, <Dictator> Dennison Mascot, MD Wardell Heath [place] MD ELECTRONICALLY SIGNED  05/02/2014 14:13

## 2015-03-28 NOTE — Discharge Summary (Signed)
Guzman NAME:  Victoria Guzman, Victoria Guzman MR#:  [id number] DATE OF BIRTH:  [birth year]  DATE OF ADMISSION:  04/22/2014 DATE OF DISCHARGE:  04/24/2014  ADMITTING PHYSICIAN: Dr. Darvin Neighbours.  DISCHARGING PHYSICIAN: Gladstone Lighter.   CONSULTATIONS IN [facility]:  1. Neurology consultation Dr. Valora Corporal.  2. Cardiology consultation for TEE by Dr. Fletcher Anon.   PRIMARY CARE PHYSICIAN: Dr. Ashok Norris.   DISCHARGE DIAGNOSES: 1. 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 CT angiogram revealing high grade focal stenosis in distal left MCA with hypercoagulable work-up being pending.  3. Chronic pain syndrome. 4.  Fibromyalgia.   DISCHARGE HOME MEDICATIONS:  1. ProAir inhaler 2 puffs 4 times a day as needed.  2. Flexeril 10 mg twice a day as needed.  3. Aspirin 81 mg p.o. daily.  4.  Zolpidem 10 mg 5-1/2 tablets p.o. at bedtime.  5. Klonopin 1 mg p.o. twice a day as needed for anxiety.  6. Plavix 9m PO qdaily  DISCHARGE DIET: Low-sodium.   DISCHARGE ACTIVITY: As tolerated.    FOLLOWUP INSTRUCTIONS: 1. Neurology follow-up in 1 to 2 weeks.  2. PCP follow-up in one week.  3. Cardiology follow-up in one week for event recorder placement.   LABS AND IMAGING STUDIES PRIOR TO DISCHARGE:  1. WBC 7.8, hemoglobin 12.6, hematocrit 38.1.  2.  Platelet count 294.  3. Sodium 137, potassium 3.5, chloride 102, bicarbonate 30, BUN 9, creatinine 0.73, glucose 115 and calcium of 10.7. Troponin is negative.  4. CT of [place] head without contrast showing normal head CT without any acute intracranial abnormality.  5. Chest x-ray showing no evidence of cardiopulmonary disease.  6. MRI of [place] brain showing small areas of acute infarct in left parietal cortex and chronic infarct and left caudate nucleus.  7. Ultrasound Dopplers off [place] carotids showing no hemodynamically significant stenosis or plaque noted. There is small, thyroid nodule that was incidentally  found.  8. TSH within normal limits at 1.5. Vitamin B12 and  ESR within normal limits. CRP is slightly elevated at 8.  9. ANA work up pending 10. LDL cholesterol 65, HDL 68, total cholesterol 151, triglycerides of 88.   11. CT angio head and neck showing focal high-grade stenosis in [place] left MCA. Thromboembolic disease is favored over atherosclerosis. Patent, but decreased flow in left middle cerebral artery branches noted as well.  12. Echo Doppler, transthoracic echo revealing no source of CVA. Sinus tachycardia left ventricular ejection fraction is 60% to 65%,  mild to moderate left ventricular hypertrophy is noted. TEE also revealing negative bubble study with no evidence of any patent foramen ovale.  No cardiac source of embolism is noted. Normal global LV systolic function, ejection fraction of 60% to 65%.   BRIEF HOSPITAL COURSE: Victoria Guzman a 49year old African American female with past medical history significant for fibromyalgia, chronic pain syndrome admitted to [facility] secondary to transient episode of right-sided numbness and weakness. Initially thought to be transient ischemic attack or MRI came back positive.  1. Acute cerebrovascular accident with left parietal infarct and an old left caudate nucleus infarct also seen. Victoria Guzman, however, symptoms were transient and there was no residual neurological deficits at this time. Victoria Guzman did have another episode of transient tingling, numbness on [facility] same side in [facility], which lasted less than a couple of minutes. However, she did recover completely and this happened within [facility] first 48 hours of admission.  Victoria Guzman's cholesterol is within normal limits.  She did not want  to take statins since Victoria Guzman cholesterol was normal. She was seen by neurologist in [facility]. Hypercoagulable work up was sent for because of no risk factors were seen. Victoria Guzman ANA panel, hypercoagulable work-up is pending. She will follow up with neurology as  an outpatient. She was not taking any antiplatelet agent prior to admission, so she is being discharged on aspirin at this time. Victoria Guzman Dopplers of [place] neck did not show any stenosis or blocks so CT angiogram was done which showed focal high grade stenosis, probably thromboembolic source obstructing Victoria left MCA. So, echocardiogram was done which did not show any cardiac emboli, so TEE was done and it also did not show any patent foramen ovale or cardiac source of emboli. Victoria Guzman might benefit from having a loop recorder for cardiac monitoring as an outpatient to rule out any arrhythmia; however,  hypercoagulable work-up follow-up is also needed. Victoria Guzman is strongly advised to follow up with cardiology, neurology as an outpatient.   Victoria Guzman course has been otherwise uneventful in [facility].   DISCHARGE CONDITION: Stable.   DISCHARGE DISPOSITION: Home.  TIME SPENT ON DISCHARGE: 45 minutes.   ADDENDUM- Victoria Guzman experienced another episode of transient right arm weakness in [facility]. so Neurology recommended adding plavix also after loading Victoria Guzman with plavix in [facility]. She also was observed overnight and got discharged on 04/25/14  ____________________________ Gladstone Lighter, MD rk:sg D: 04/24/2014 13:02:25 ET T: 04/24/2014 14:15:40 ET JOB#: [id number]  cc: Gladstone Lighter, MD, <Dictator> Ashok Norris, MD  Gladstone Lighter MD ELECTRONICALLY SIGNED 05/01/2014 11:50

## 2015-07-07 ENCOUNTER — Encounter (INDEPENDENT_AMBULATORY_CARE_PROVIDER_SITE_OTHER): Payer: Self-pay

## 2015-07-07 ENCOUNTER — Other Ambulatory Visit: Payer: Self-pay

## 2015-07-07 ENCOUNTER — Ambulatory Visit (INDEPENDENT_AMBULATORY_CARE_PROVIDER_SITE_OTHER): Payer: Medicare HMO | Admitting: Family Medicine

## 2015-07-07 ENCOUNTER — Encounter: Payer: Self-pay | Admitting: Family Medicine

## 2015-07-07 VITALS — BP 140/88 | HR 90 | Temp 98.8°F | Resp 16 | Ht 65.0 in | Wt 191.1 lb

## 2015-07-07 DIAGNOSIS — D649 Anemia, unspecified: Secondary | ICD-10-CM | POA: Insufficient documentation

## 2015-07-07 DIAGNOSIS — F329 Major depressive disorder, single episode, unspecified: Secondary | ICD-10-CM | POA: Diagnosis not present

## 2015-07-07 DIAGNOSIS — I10 Essential (primary) hypertension: Secondary | ICD-10-CM | POA: Diagnosis not present

## 2015-07-07 DIAGNOSIS — F32A Depression, unspecified: Secondary | ICD-10-CM

## 2015-07-07 DIAGNOSIS — I6340 Cerebral infarction due to embolism of unspecified cerebral artery: Secondary | ICD-10-CM

## 2015-07-07 DIAGNOSIS — M797 Fibromyalgia: Secondary | ICD-10-CM

## 2015-07-07 DIAGNOSIS — F419 Anxiety disorder, unspecified: Secondary | ICD-10-CM

## 2015-07-07 DIAGNOSIS — J40 Bronchitis, not specified as acute or chronic: Secondary | ICD-10-CM

## 2015-07-07 DIAGNOSIS — G4700 Insomnia, unspecified: Secondary | ICD-10-CM

## 2015-07-07 DIAGNOSIS — I639 Cerebral infarction, unspecified: Secondary | ICD-10-CM

## 2015-07-07 MED ORDER — AZITHROMYCIN 500 MG PO TABS: 500.0000 mg | ORAL_TABLET | Freq: Every day | ORAL | Status: DC

## 2015-07-07 MED ORDER — CYCLOBENZAPRINE HCL 10 MG PO TABS: 10.0000 mg | ORAL_TABLET | Freq: Two times a day (BID) | ORAL | Status: DC

## 2015-07-07 MED ORDER — ZOLPIDEM TARTRATE 10 MG PO TABS: 15.0000 mg | ORAL_TABLET | Freq: Every day | ORAL | Status: DC

## 2015-07-07 MED ORDER — FLUCONAZOLE 150 MG PO TABS: 150.0000 mg | ORAL_TABLET | Freq: Every day | ORAL | Status: DC

## 2015-07-07 MED ORDER — CLONAZEPAM 2 MG PO TABS: 2.0000 mg | ORAL_TABLET | ORAL | Status: DC | PRN

## 2015-07-07 MED ORDER — ASPIRIN 81 MG PO TBEC: 81.0000 mg | DELAYED_RELEASE_TABLET | Freq: Every day | ORAL | Status: DC

## 2015-07-07 MED ORDER — HYDROCOD POLST-CPM POLST ER 10-8 MG/5ML PO SUER: 5.0000 mL | Freq: Every evening | ORAL | Status: DC | PRN

## 2015-07-07 MED ORDER — DEXTROMETHORPHAN POLISTIREX ER 30 MG/5ML PO SUER: 30.0000 mg | Freq: Two times a day (BID) | ORAL | Status: DC

## 2015-07-07 MED ORDER — LISINOPRIL-HYDROCHLOROTHIAZIDE 20-12.5 MG PO TABS: ORAL_TABLET | ORAL | Status: DC

## 2015-07-07 NOTE — Patient Instructions (Signed)

## 2015-07-07 NOTE — Progress Notes (Signed)
Name: Victoria Guzman   MRN: [id number]    DOB: Aug 08, 1966   Date:07/07/2015       Progress Note  Subjective  Chief Complaint  Chief Complaint  Patient presents with  . Laryngitis    pt lost her voice on Saturday,has mucus but no sore throat    HPI  Tracheal laryngitis.  Patient presents with a four-day history of laryngitis t and mucus production and cough.  Past Medical History  Diagnosis Date  . Stroke   . Fibromyalgia   . Chronic fatigue   . Anxiety   . Insomnia   . Panic attack     History  Substance Use Topics  . Smoking status: Never Smoker   . Smokeless tobacco: Not on file  . Alcohol Use: Yes     Current outpatient prescriptions:  .  clonazePAM (KLONOPIN) 2 MG tablet, Take 2 mg by mouth as needed. , Disp: , Rfl:  .  [facility] ASPIRIN LOW DOSE 81 MG EC tablet, Take 81 mg by mouth daily. , Disp: , Rfl:  .  cyclobenzaprine (FLEXERIL) 10 MG tablet, Take 10 mg by mouth daily as needed. , Disp: , Rfl:  .  fluconazole (DIFLUCAN) 150 MG tablet, Take 150 mg by mouth as needed. , Disp: , Rfl:  .  lisinopril-hydrochlorothiazide (PRINZIDE,ZESTORETIC) 20-12.5 MG per tablet, TK 1 T BID FOR HYPERTENSION AND KIDNEY PROTECTION. NEED TO SCHEDULE CARDIOLOGY APPOINTMENT - NO FURTHER REFILLS WILL BE AUTHORIZED, Disp: , Rfl: 1 .  PROAIR HFA 108 (90 BASE) MCG/ACT inhaler, Inhale 1 puff into the lungs as needed. , Disp: , Rfl:  .  zolpidem (AMBIEN) 10 MG tablet, Take 10 mg by mouth at bedtime. , Disp: , Rfl:   Allergies  Allergen Reactions  . Quinolones     Other reaction(s): Unknown    Review of Systems  Constitutional: Positive for malaise/fatigue. Negative for fever, chills and weight loss.  HENT: Positive for congestion and sore throat. Negative for hearing loss and tinnitus.        Pharyngitis and laryngitis  Eyes: Negative for blurred vision, double vision and redness.  Respiratory: Positive for cough and sputum production. Negative for hemoptysis, shortness of breath  and wheezing.   Cardiovascular: Negative for chest pain, palpitations, orthopnea, claudication and leg swelling.  Gastrointestinal: Negative for heartburn, nausea, vomiting, diarrhea, constipation and blood in stool.  Genitourinary: Negative for dysuria, urgency, frequency and hematuria.  Musculoskeletal: Positive for myalgias, back pain, joint pain and neck pain. Negative for falls.  Skin: Negative for itching.  Neurological: Positive for tingling, weakness and headaches. Negative for dizziness, tremors, focal weakness, seizures and loss of consciousness.       Subacute history of CVA with mild residual right-sided weakness  Endo/Heme/Allergies: Does not bruise/bleed easily.  Psychiatric/Behavioral: Positive for depression. Negative for substance abuse. The patient is nervous/anxious and has insomnia.        Currently stress issues with her separation and eventual divorce      Objective  Filed Vitals:   07/07/15 0933  BP: 140/88  Pulse: 90  Temp: 98.8 F (37.1 C)  Resp: 16  Height: 5\' 5"  (1.651 m)  Weight: 191 lb 2 oz (86.694 kg)  SpO2: 97%     Physical Exam  Constitutional: She is oriented to person, place, and time and well-developed, well-nourished, and in no distress.  HENT:  Head: Normocephalic.  Bilateral dullness of the TMs  Eyes: EOM are normal. Pupils are equal, round, and reactive to light.  Neck: Normal range of motion. No thyromegaly present.  Cardiovascular: Normal rate, regular rhythm and normal heart sounds.   No murmur heard. Pulmonary/Chest: Effort normal and breath sounds normal.  Abdominal: Soft. Bowel sounds are normal.  Musculoskeletal: She exhibits tenderness. She exhibits no edema.  Diffuse paraspinal muscle is tenderness and spasm from the cervical to the lumbar area as well as epitrochlear discomfort to palpation  Neurological: She is alert and oriented to person, place, and time. No cranial nerve deficit. Gait normal.  Skin: Skin is warm and dry.  No rash noted.  Psychiatric: Memory and affect normal.  Depressed and anxious and loquacious with chronic whining      Assessment & Plan   1. Tracheobronchitis  - chlorpheniramine-HYDROcodone (TUSSIONEX PENNKINETIC ER) 10-8 MG/5ML SUER; Take 5 mLs by mouth at bedtime as needed for cough.  Dispense: 140 mL; Refill: 0 - azithromycin (ZITHROMAX) 500 MG tablet; Take 1 tablet (500 mg total) by mouth daily.  Dispense: 10 tablet; Refill: 0 - dextromethorphan (DELSYM) 30 MG/5ML liquid; Take 5 mLs (30 mg total) by mouth 2 (two) times daily.  Dispense: 89 mL; Refill: 11  2. Clinical depression In need of professional counseling, but refuses  3. Embolic stroke Minimal residual r sided weakness  4. Essential hypertension Near control  5. Depression Needs counseling  6. Acute anxiety  - clonazePAM (KLONOPIN) 2 MG tablet; Take 1 tablet (2 mg total) by mouth as needed.  Dispense: 60 tablet; Refill: 1  7. Primary fibromyalgia syndrome  - cyclobenzaprine (FLEXERIL) 10 MG tablet; Take 1 tablet (10 mg total) by mouth 2 (two) times daily.  Dispense: 60 tablet; Refill: 1  8. Insomnia  - zolpidem (AMBIEN) 10 MG tablet; Take 1.5 tablets (15 mg total) by mouth at bedtime.  Dispense: 45 tablet; Refill: 1

## 2015-08-11 ENCOUNTER — Ambulatory Visit (INDEPENDENT_AMBULATORY_CARE_PROVIDER_SITE_OTHER): Payer: Managed Care, Other (non HMO) | Admitting: Family Medicine

## 2015-08-11 ENCOUNTER — Encounter: Payer: Self-pay | Admitting: Family Medicine

## 2015-08-11 VITALS — BP 128/76 | HR 112 | Temp 98.9°F | Resp 16 | Ht 65.0 in | Wt 189.1 lb

## 2015-08-11 DIAGNOSIS — F32A Depression, unspecified: Secondary | ICD-10-CM

## 2015-08-11 DIAGNOSIS — F419 Anxiety disorder, unspecified: Secondary | ICD-10-CM

## 2015-08-11 DIAGNOSIS — G4700 Insomnia, unspecified: Secondary | ICD-10-CM

## 2015-08-11 DIAGNOSIS — R5383 Other fatigue: Secondary | ICD-10-CM | POA: Diagnosis not present

## 2015-08-11 DIAGNOSIS — Z635 Disruption of family by separation and divorce: Secondary | ICD-10-CM

## 2015-08-11 DIAGNOSIS — F329 Major depressive disorder, single episode, unspecified: Secondary | ICD-10-CM

## 2015-08-11 DIAGNOSIS — M797 Fibromyalgia: Secondary | ICD-10-CM | POA: Diagnosis not present

## 2015-08-11 MED ORDER — ESCITALOPRAM OXALATE 10 MG PO TABS: 10.0000 mg | ORAL_TABLET | Freq: Two times a day (BID) | ORAL | Status: DC

## 2015-08-11 MED ORDER — PREDNISONE 20 MG PO TABS: 20.0000 mg | ORAL_TABLET | Freq: Every day | ORAL | Status: DC

## 2015-08-11 MED ORDER — CYCLOBENZAPRINE HCL 10 MG PO TABS: 10.0000 mg | ORAL_TABLET | Freq: Two times a day (BID) | ORAL | Status: DC

## 2015-08-11 MED ORDER — HYDROCOD POLST-CPM POLST ER 10-8 MG/5ML PO SUER: 5.0000 mL | Freq: Two times a day (BID) | ORAL | Status: DC | PRN

## 2015-08-11 MED ORDER — ALBUTEROL SULFATE HFA 108 (90 BASE) MCG/ACT IN AERS: 2.0000 | INHALATION_SPRAY | RESPIRATORY_TRACT | Status: DC | PRN

## 2015-08-11 MED ORDER — ZOLPIDEM TARTRATE 10 MG PO TABS: 15.0000 mg | ORAL_TABLET | Freq: Every day | ORAL | Status: DC

## 2015-08-11 MED ORDER — LISINOPRIL-HYDROCHLOROTHIAZIDE 20-12.5 MG PO TABS: ORAL_TABLET | ORAL | Status: DC

## 2015-08-11 MED ORDER — CLONAZEPAM 2 MG PO TABS: 2.0000 mg | ORAL_TABLET | Freq: Two times a day (BID) | ORAL | Status: DC

## 2015-08-11 NOTE — Progress Notes (Signed)
Name: Victoria Guzman   MRN: [id number]    DOB: [birth year]   Date:08/11/2015       Progress Note  Subjective  Chief Complaint  No chief complaint on file.   HPI  Fibromyalgia syndrome  Patient has long-standing fibromyalgia with chronic fatigue syndrome. She is always in pain always has insomnia and is always stiff. Rather changers exacerbate her problem and is due emotional situational changes.  Clinical depression  Patient has recently gone through recent separation and is contemplating divorce after infidelity by her husband. She is devastated by this and currently is on disability and living with other friends and family members here. Her estranged husband is living in [place]. She has very little support.  This community. She has not been seen a counselor or psychiatrist of this is been suggested numerous times in the past. There is no suicidal ideation or intent. Nor is her homicidal ideation or intent.  History of an embolic stroke with minimal right-sided weakness  She has had MINIMAL residual weakness.   Hypertension  Somewhat labile with her recent stressors. Continues on regular medication  Anxiety  Currently on clonazepam but states her once a day dosage is insufficient to control her symptomatology.  Insomnia  Chronic insomnia. She requires 15 mg of Ambien rather than the usual 10 or 12-1/2 for sleep induction. Her chronic pain and numerous stressors make problem worse  Past Medical History  Diagnosis Date  . Stroke   . Fibromyalgia   . Chronic fatigue   . Anxiety   . Insomnia   . Panic attack     Social History  Substance Use Topics  . Smoking status: Never Smoker   . Smokeless tobacco: Not on file  . Alcohol Use: Yes     Current outpatient prescriptions:  .  aspirin ([facility] ASPIRIN LOW DOSE) 81 MG EC tablet, Take 1 tablet (81 mg total) by mouth daily., Disp: 30 tablet, Rfl: 8 .  azithromycin (ZITHROMAX) 500 MG tablet, Take 1 tablet (500 mg  total) by mouth daily., Disp: 10 tablet, Rfl: 0 .  chlorpheniramine-HYDROcodone (TUSSIONEX PENNKINETIC ER) 10-8 MG/5ML SUER, Take 5 mLs by mouth at bedtime as needed for cough., Disp: 140 mL, Rfl: 0 .  clonazePAM (KLONOPIN) 2 MG tablet, Take 1 tablet (2 mg total) by mouth as needed., Disp: 60 tablet, Rfl: 1 .  cyclobenzaprine (FLEXERIL) 10 MG tablet, Take 1 tablet (10 mg total) by mouth 2 (two) times daily., Disp: 60 tablet, Rfl: 1 .  dextromethorphan (DELSYM) 30 MG/5ML liquid, Take 5 mLs (30 mg total) by mouth 2 (two) times daily., Disp: 89 mL, Rfl: 11 .  fluconazole (DIFLUCAN) 150 MG tablet, Take 1 tablet (150 mg total) by mouth daily., Disp: 7 tablet, Rfl: 0 .  lisinopril-hydrochlorothiazide (PRINZIDE,ZESTORETIC) 20-12.5 MG per tablet, TK 1 T BID FOR HYPERTENSION AND KIDNEY PROTECTION., Disp: 60 tablet, Rfl: 1 .  PROAIR HFA 108 (90 BASE) MCG/ACT inhaler, Inhale 1 puff into the lungs as needed. , Disp: , Rfl:  .  zolpidem (AMBIEN) 10 MG tablet, Take 1.5 tablets (15 mg total) by mouth at bedtime., Disp: 45 tablet, Rfl: 1  Allergies  Allergen Reactions  . Quinolones     Other reaction(s): Unknown    Review of Systems  Constitutional: Positive for malaise/fatigue. Negative for fever, chills and weight loss.  HENT: Negative for congestion, hearing loss, sore throat and tinnitus.   Eyes: Negative for blurred vision, double vision and redness.  Respiratory: Positive for cough and wheezing.  Negative for hemoptysis and shortness of breath.   Cardiovascular: Positive for chest pain and palpitations. Negative for orthopnea, claudication and leg swelling.  Gastrointestinal: Negative for heartburn, nausea, vomiting, diarrhea, constipation and blood in stool.  Genitourinary: Negative for dysuria, urgency, frequency and hematuria.  Musculoskeletal: Positive for myalgias, back pain, joint pain and neck pain. Negative for falls.  Skin: Negative for itching.  Neurological: Positive for weakness and  headaches. Negative for dizziness, tingling, tremors, focal weakness, seizures and loss of consciousness.  Endo/Heme/Allergies: Does not bruise/bleed easily.  Psychiatric/Behavioral: Positive for depression. Negative for suicidal ideas, hallucinations and substance abuse. The patient is nervous/anxious and has insomnia.      Objective  There were no vitals filed for this visit.   Physical Exam  Constitutional: She is oriented to person, place, and time and well-developed, well-nourished, and in no distress.  HENT:  Head: Normocephalic.  Eyes: EOM are normal. Pupils are equal, round, and reactive to light.  Neck: Normal range of motion. No thyromegaly present.  Cardiovascular: Normal rate, regular rhythm and normal heart sounds.   No murmur heard. Pulmonary/Chest: Effort normal and breath sounds normal.  Abdominal: Soft. Bowel sounds are normal.  Musculoskeletal: She exhibits edema (TRACE BILATERALLY) and tenderness (DIFFUSELY along the entire axillary skeleton as well as the epitrochlear area).  Lymphadenopathy:    She has no cervical adenopathy.  Neurological: She is alert and oriented to person, place, and time. No cranial nerve deficit. Gait normal.  Skin: Skin is warm and dry. No rash noted.  Psychiatric:  Chronically whiny loquacious tearful very labile during exam approximately 30 minutes was spent in counseling      Assessment & Plan   1. Insomnia Worsening - zolpidem (AMBIEN) 10 MG tablet; Take 1.5 tablets (15 mg total) by mouth at bedtime.  Dispense: 45 tablet; Refill: 1  2. Depression Worsening suggest psychiatric referral but she refuses at this time. We'll and antidepressant today and recheck in 1 month  3. Acute anxiety Continue her current regimen - clonazePAM (KLONOPIN) 2 MG tablet; Take 1 tablet (2 mg total) by mouth 2 (two) times daily.  Dispense: 60 tablet; Refill: 1  4. Primary fibromyalgia syndrome Continue current reg** imen - cyclobenzaprine  (FLEXERIL) 10 MG tablet; Take 1 tablet (10 mg total) by mouth 2 (two) times daily.  Dispense: 60 tablet; Refill: 2  5. Marital conflict involving divorce Counseling suggested but refused at this point* 6. Fatigue due to depression Treatment of depression  7. Fibromyalgia Stable

## 2015-09-08 ENCOUNTER — Ambulatory Visit: Payer: Managed Care, Other (non HMO) | Admitting: Family Medicine

## 2015-09-21 ENCOUNTER — Encounter: Payer: Managed Care, Other (non HMO) | Admitting: Family Medicine

## 2015-10-03 ENCOUNTER — Other Ambulatory Visit: Payer: Self-pay | Admitting: Family Medicine

## 2015-10-12 ENCOUNTER — Ambulatory Visit (INDEPENDENT_AMBULATORY_CARE_PROVIDER_SITE_OTHER): Payer: Managed Care, Other (non HMO) | Admitting: Family Medicine

## 2015-10-12 ENCOUNTER — Encounter: Payer: Self-pay | Admitting: Family Medicine

## 2015-10-12 VITALS — BP 138/88 | HR 93 | Temp 98.0°F | Resp 18 | Ht 65.0 in | Wt 192.2 lb

## 2015-10-12 DIAGNOSIS — G4700 Insomnia, unspecified: Secondary | ICD-10-CM

## 2015-10-12 DIAGNOSIS — Z0000 Encounter for general adult medical examination without abnormal findings: Secondary | ICD-10-CM

## 2015-10-12 DIAGNOSIS — F419 Anxiety disorder, unspecified: Secondary | ICD-10-CM

## 2015-10-12 MED ORDER — ZOLPIDEM TARTRATE 10 MG PO TABS: 15.0000 mg | ORAL_TABLET | Freq: Every day | ORAL | Status: DC

## 2015-10-12 MED ORDER — CLONAZEPAM 2 MG PO TABS: 2.0000 mg | ORAL_TABLET | Freq: Two times a day (BID) | ORAL | Status: DC

## 2015-10-12 NOTE — Progress Notes (Signed)
Name: Victoria Guzman   MRN: [id number]    DOB: Feb 09, 1966   Date:10/12/2015       Progress Note  Subjective  Chief Complaint  Chief Complaint  Patient presents with  . Annual Exam    HPI   Depression screen [facility] 2/9 10/12/2015  Decreased Interest 0  Down, Depressed, Hopeless 0  PHQ - 2 Score 0   Fall Risk  10/12/2015 08/11/2015  Falls in the past year? No No   Functional Status Survey: Is the patient deaf or have difficulty hearing?: No Does the patient have difficulty seeing, even when wearing glasses/contacts?: No Does the patient have difficulty concentrating, remembering, or making decisions?: No Does the patient have difficulty walking or climbing stairs?: No Does the patient have difficulty dressing or bathing?: No Does the patient have difficulty doing errands alone such as visiting a doctor's office or shopping?: No  49 year old female presenting for H&P. Baseline problems are stable. Past Medical History  Diagnosis Date  . Stroke (HCC)   . Fibromyalgia   . Chronic fatigue   . Anxiety   . Insomnia   . Panic attack     Social History  Substance Use Topics  . Smoking status: Never Smoker   . Smokeless tobacco: Not on file  . Alcohol Use: Yes     Current outpatient prescriptions:  .  albuterol (PROAIR HFA) 108 (90 BASE) MCG/ACT inhaler, Inhale 2 puffs into the lungs as needed., Disp: 1 Inhaler, Rfl: 5 .  aspirin ([facility] ASPIRIN LOW DOSE) 81 MG EC tablet, Take 1 tablet (81 mg total) by mouth daily., Disp: 30 tablet, Rfl: 8 .  chlorpheniramine-HYDROcodone (TUSSIONEX PENNKINETIC ER) 10-8 MG/5ML SUER, Take 5 mLs by mouth every 12 (twelve) hours as needed for cough., Disp: 140 mL, Rfl: 0 .  clonazePAM (KLONOPIN) 2 MG tablet, Take 1 tablet (2 mg total) by mouth 2 (two) times daily., Disp: 60 tablet, Rfl: 1 .  cyclobenzaprine (FLEXERIL) 10 MG tablet, Take 1 tablet (10 mg total) by mouth 2 (two) times daily., Disp: 60 tablet, Rfl: 2 .  dextromethorphan (DELSYM) 30  MG/5ML liquid, Take 5 mLs (30 mg total) by mouth 2 (two) times daily., Disp: 89 mL, Rfl: 11 .  escitalopram (LEXAPRO) 10 MG tablet, TAKE 1 TABLET BY MOUTH ONCE A DAY FOR THE FIRST WEEK, THEN INCREASE TO 1 TABLET TWICE A DAY, Disp: 60 tablet, Rfl: 0 .  lisinopril-hydrochlorothiazide (PRINZIDE,ZESTORETIC) 20-12.5 MG per tablet, TK 1 T BID FOR HYPERTENSION AND KIDNEY PROTECTION., Disp: 60 tablet, Rfl: 5 .  predniSONE (DELTASONE) 20 MG tablet, Take 1 tablet (20 mg total) by mouth daily with breakfast., Disp: 10 tablet, Rfl: 0 .  zolpidem (AMBIEN) 10 MG tablet, Take 1.5 tablets (15 mg total) by mouth at bedtime., Disp: 45 tablet, Rfl: 1  Allergies  Allergen Reactions  . Quinolones     Other reaction(s): Unknown  . Moxifloxacin Rash    Review of Systems  Constitutional: Positive for malaise/fatigue. Negative for fever, chills and weight loss.  HENT: Negative for congestion, hearing loss, sore throat and tinnitus.   Eyes: Negative for blurred vision, double vision and redness.  Respiratory: Positive for cough and wheezing. Negative for hemoptysis and shortness of breath.   Cardiovascular: Positive for chest pain. Negative for palpitations, orthopnea, claudication and leg swelling.  Gastrointestinal: Positive for heartburn. Negative for nausea, vomiting, diarrhea, constipation and blood in stool.  Genitourinary: Negative for dysuria, urgency, frequency and hematuria.  Musculoskeletal: Positive for myalgias and joint pain. Negative  for back pain, falls and neck pain.  Skin: Negative for itching.  Neurological: Positive for weakness and headaches. Negative for dizziness, tingling, tremors, focal weakness, seizures and loss of consciousness.  Endo/Heme/Allergies: Does not bruise/bleed easily.  Psychiatric/Behavioral: Positive for depression. Negative for substance abuse. The patient is nervous/anxious and has insomnia.      Objective  Filed Vitals:   10/12/15 1347  BP: 138/88  Pulse: 93   Temp: 98 F (36.7 C)  Resp: 18  Height: 5\' 5"  (1.651 m)  Weight: 192 lb 3 oz (87.176 kg)  SpO2: 95%     Physical Exam  Constitutional: She is oriented to person, place, and time and well-developed, well-nourished, and in no distress.  HENT:  Head: Normocephalic.  Eyes: EOM are normal. Pupils are equal, round, and reactive to light.  Neck: Normal range of motion. No thyromegaly present.  Cardiovascular: Normal rate, regular rhythm and normal heart sounds.   No murmur heard. Pulmonary/Chest: Effort normal and breath sounds normal.  Breast exam is normal  Abdominal: Soft. Bowel sounds are normal.  Genitourinary:  Deferred per patient's request  Musculoskeletal: Normal range of motion. She exhibits no edema.  Neurological: She is alert and oriented to person, place, and time. No cranial nerve deficit. Gait normal.  Skin: Skin is warm and dry. No rash noted.  Psychiatric: Memory and affect normal.      Assessment & Plan  1. Annual physical exam  - CBC - Comprehensive Metabolic Panel (CMET) - Lipid Profile - TSH - HIV 1 RNA quant-no reflex-bld - Chlamydia/Gonococcus/Trichomonas, NAA - Pap IG w/ reflex to HPV when ASC-U - Herpes simplex virus culture  2. Insomnia Continue Ambien - zolpidem (AMBIEN) 10 MG tablet; Take 1.5 tablets (15 mg total) by mouth at bedtime.  Dispense: 45 tablet; Refill: 2  3. Acute anxiety Continue current meds - clonazePAM (KLONOPIN) 2 MG tablet; Take 1 tablet (2 mg total) by mouth 2 (two) times daily.  Dispense: 60 tablet; Refill: 2

## 2015-10-13 LAB — CBC
HEMATOCRIT: 38 % (ref 34.0–46.6)
HEMOGLOBIN: 12.4 g/dL (ref 11.1–15.9)
MCH: 26.9 pg (ref 26.6–33.0)
MCHC: 32.6 g/dL (ref 31.5–35.7)
MCV: 82 fL (ref 79–97)
Platelets: 377 10*3/uL (ref 150–379)
RBC: 4.61 x10E6/uL (ref 3.77–5.28)
RDW: 15.2 % (ref 12.3–15.4)
WBC: 9.4 10*3/uL (ref 3.4–10.8)

## 2015-10-13 LAB — COMPREHENSIVE METABOLIC PANEL
A/G RATIO: 1.4 (ref 1.1–2.5)
ALBUMIN: 4.4 g/dL (ref 3.5–5.5)
ALT: 13 IU/L (ref 0–32)
AST: 18 IU/L (ref 0–40)
Alkaline Phosphatase: 67 IU/L (ref 39–117)
BUN / CREAT RATIO: 11 (ref 9–23)
BUN: 8 mg/dL (ref 6–24)
Bilirubin Total: 0.6 mg/dL (ref 0.0–1.2)
CALCIUM: 11.2 mg/dL — AB (ref 8.7–10.2)
CO2: 25 mmol/L (ref 18–29)
Chloride: 96 mmol/L — ABNORMAL LOW (ref 97–106)
Creatinine, Ser: 0.72 mg/dL (ref 0.57–1.00)
GFR, EST AFRICAN AMERICAN: 115 mL/min/{1.73_m2} (ref >59)
GFR, EST NON AFRICAN AMERICAN: 100 mL/min/{1.73_m2} (ref >59)
GLOBULIN, TOTAL: 3.1 g/dL (ref 1.5–4.5)
Glucose: 99 mg/dL (ref 65–99)
POTASSIUM: 3.3 mmol/L — AB (ref 3.5–5.2)
Sodium: 135 mmol/L — ABNORMAL LOW (ref 136–144)
TOTAL PROTEIN: 7.5 g/dL (ref 6.0–8.5)

## 2015-10-13 LAB — LIPID PANEL
CHOL/HDL RATIO: 1.8 ratio (ref 0.0–4.4)
Cholesterol, Total: 180 mg/dL (ref 100–199)
HDL: 98 mg/dL (ref >39)
LDL Calculated: 65 mg/dL (ref 0–99)
Triglycerides: 87 mg/dL (ref 0–149)
VLDL Cholesterol Cal: 17 mg/dL (ref 5–40)

## 2015-10-13 LAB — TSH: TSH: 3.14 u[IU]/mL (ref 0.450–4.500)

## 2015-10-13 LAB — HIV-1 RNA QUANT-NO REFLEX-BLD

## 2015-10-15 LAB — CHLAMYDIA/GONOCOCCUS/TRICHOMONAS, NAA
Chlamydia by NAA: NEGATIVE
Gonococcus by NAA: NEGATIVE
TRICH VAG BY NAA: NEGATIVE

## 2015-10-16 ENCOUNTER — Telehealth: Payer: Self-pay | Admitting: Family Medicine

## 2015-10-16 ENCOUNTER — Other Ambulatory Visit: Payer: Self-pay

## 2015-10-16 MED ORDER — LISINOPRIL 20 MG PO TABS: 20.0000 mg | ORAL_TABLET | Freq: Every day | ORAL | Status: DC

## 2015-10-16 NOTE — Telephone Encounter (Signed)
Pt would like to know if she can get lab results.

## 2015-10-16 NOTE — Telephone Encounter (Signed)
Spoke with pt and went over results and she wanted rx printed and stated that she would come to pick it up and that she already has an appointment for Dec.

## 2015-10-21 LAB — PAP IG W/ RFLX HPV ASCU: PAP Smear Comment: 0

## 2015-10-21 LAB — HPV DNA PROBE HIGH RISK, AMPLIFIED: HPV, HIGH-RISK: NEGATIVE

## 2015-11-12 ENCOUNTER — Ambulatory Visit: Payer: Managed Care, Other (non HMO) | Admitting: Family Medicine

## 2015-11-23 ENCOUNTER — Ambulatory Visit: Payer: Managed Care, Other (non HMO) | Admitting: Family Medicine

## 2015-12-03 ENCOUNTER — Other Ambulatory Visit: Payer: Self-pay | Admitting: Family Medicine

## 2016-01-31 ENCOUNTER — Other Ambulatory Visit: Payer: Self-pay | Admitting: Family Medicine

## 2016-05-30 ENCOUNTER — Ambulatory Visit: Payer: Managed Care, Other (non HMO) | Admitting: Family Medicine

## 2016-05-31 ENCOUNTER — Encounter: Payer: Self-pay | Admitting: Family Medicine

## 2016-05-31 ENCOUNTER — Telehealth: Payer: Self-pay | Admitting: Family Medicine

## 2016-05-31 ENCOUNTER — Ambulatory Visit (INDEPENDENT_AMBULATORY_CARE_PROVIDER_SITE_OTHER): Payer: Managed Care, Other (non HMO) | Admitting: Family Medicine

## 2016-05-31 DIAGNOSIS — E559 Vitamin D deficiency, unspecified: Secondary | ICD-10-CM | POA: Diagnosis not present

## 2016-05-31 DIAGNOSIS — R7303 Prediabetes: Secondary | ICD-10-CM | POA: Diagnosis not present

## 2016-05-31 DIAGNOSIS — Z113 Encounter for screening for infections with a predominantly sexual mode of transmission: Secondary | ICD-10-CM | POA: Insufficient documentation

## 2016-05-31 DIAGNOSIS — Z5181 Encounter for therapeutic drug level monitoring: Secondary | ICD-10-CM

## 2016-05-31 DIAGNOSIS — M797 Fibromyalgia: Secondary | ICD-10-CM

## 2016-05-31 DIAGNOSIS — E876 Hypokalemia: Secondary | ICD-10-CM | POA: Insufficient documentation

## 2016-05-31 DIAGNOSIS — I10 Essential (primary) hypertension: Secondary | ICD-10-CM | POA: Diagnosis not present

## 2016-05-31 DIAGNOSIS — F5104 Psychophysiologic insomnia: Secondary | ICD-10-CM | POA: Insufficient documentation

## 2016-05-31 DIAGNOSIS — G4700 Insomnia, unspecified: Secondary | ICD-10-CM

## 2016-05-31 DIAGNOSIS — E878 Other disorders of electrolyte and fluid balance, not elsewhere classified: Secondary | ICD-10-CM | POA: Diagnosis not present

## 2016-05-31 DIAGNOSIS — Z114 Encounter for screening for human immunodeficiency virus [HIV]: Secondary | ICD-10-CM | POA: Insufficient documentation

## 2016-05-31 HISTORY — DX: Psychophysiologic insomnia: F51.04

## 2016-05-31 HISTORY — DX: Vitamin D deficiency, unspecified: E55.9

## 2016-05-31 MED ORDER — CYCLOBENZAPRINE HCL 10 MG PO TABS: 10.0000 mg | ORAL_TABLET | Freq: Three times a day (TID) | ORAL | Status: DC | PRN

## 2016-05-31 MED ORDER — ZOLPIDEM TARTRATE 5 MG PO TABS: 5.0000 mg | ORAL_TABLET | Freq: Every evening | ORAL | Status: DC | PRN

## 2016-05-31 NOTE — Telephone Encounter (Signed)
I am so sorry but I will need her to bring in her bottles; we have no record of clonazepam being filled anywhere since December, so I need her to please bring her original bottle in with pills for review I am not going to prescribe Flexeril; we talked about that at her appointment; she opted to use zanaflex  Instead of Flexeril; I told her I would not approve that she have both on her med list We have no record of zanaflex Rx in the chart either; I need her to please bring that original bottle with pills in it for review If she'll bring in those original pill bottles, we can consider refills of the clonazepam and zanaflex Thank you

## 2016-05-31 NOTE — Patient Instructions (Addendum)
Look into cognitive behavioral therapy for insomnia I'll get you in to see a sleep specialist STOP the lisinopril/HCTZ and start plain lisinopril Your goal blood pressure is less than 140 mmHg on top. Try to follow the DASH guidelines (DASH stands for Dietary Approaches to Stop Hypertension) Try to limit the sodium in your diet.  Ideally, consume less than 1.5 grams (less than 1,500mg ) per day. Do not add salt when cooking or at the table.  Check the sodium amount on labels when shopping, and choose items lower in sodium when given a choice. Avoid or limit foods that already contain a lot of sodium. Eat a diet rich in fruits and vegetables and whole grains.  Try melatonin 3 mg for 3 weeks at bedtime, at the EXACT same time of night  Try turmeric as a natural anti-inflammatory (for pain and arthritis). It comes in capsules where you buy aspirin and fish oil, but also as a spice where you buy pepper and garlic powder.  Never take more than 5 mg of Ambien (zolpidem) per night Take a drug holiday (skip the Ambien) one night a week if possible Never take the clonazepam and the Ambien (zolpidem) within six hours of each other   DASH Eating Plan DASH stands for "Dietary Approaches to Stop Hypertension." The DASH eating plan is a healthy eating plan that has been shown to reduce high blood pressure (hypertension). Additional health benefits may include reducing the risk of type 2 diabetes mellitus, heart disease, and stroke. The DASH eating plan may also help with weight loss. WHAT DO I NEED TO KNOW ABOUT THE DASH EATING PLAN? For the DASH eating plan, you will follow these general guidelines:  Choose foods with a percent daily value for sodium of less than 5% (as listed on the food label).  Use salt-free seasonings or herbs instead of table salt or sea salt.  Check with your health care provider or pharmacist before using salt substitutes.  Eat lower-sodium products, often labeled as "lower  sodium" or "no salt added."  Eat fresh foods.  Eat more vegetables, fruits, and low-fat dairy products.  Choose whole grains. Look for the word "whole" as the first word in the ingredient list.  Choose fish and skinless chicken or [place] more often than red meat. Limit fish, poultry, and meat to 6 oz (170 g) each day.  Limit sweets, desserts, sugars, and sugary drinks.  Choose heart-healthy fats.  Limit cheese to 1 oz (28 g) per day.  Eat more home-cooked food and less restaurant, buffet, and fast food.  Limit fried foods.  Cook foods using methods other than frying.  Limit canned vegetables. If you do use them, rinse them well to decrease the sodium.  When eating at a restaurant, ask that your food be prepared with less salt, or no salt if possible. WHAT FOODS CAN I EAT? Seek help from a dietitian for individual calorie needs. Grains Whole grain or whole wheat bread. Brown rice. Whole grain or whole wheat pasta. Quinoa, bulgur, and whole grain cereals. Low-sodium cereals. Corn or whole wheat flour tortillas. Whole grain cornbread. Whole grain crackers. Low-sodium crackers. Vegetables Fresh or frozen vegetables (raw, steamed, roasted, or grilled). Low-sodium or reduced-sodium tomato and vegetable juices. Low-sodium or reduced-sodium tomato sauce and paste. Low-sodium or reduced-sodium canned vegetables.  Fruits All fresh, canned (in natural juice), or frozen fruits. Meat and Other Protein Products Ground beef (85% or leaner), grass-fed beef, or beef trimmed of fat. Skinless chicken or [place]key. Ground  chicken or [place]key. Pork trimmed of fat. All fish and seafood. Eggs. Dried beans, peas, or lentils. Unsalted nuts and seeds. Unsalted canned beans. Dairy Low-fat dairy products, such as skim or 1% milk, 2% or reduced-fat cheeses, low-fat ricotta or cottage cheese, or plain low-fat yogurt. Low-sodium or reduced-sodium cheeses. Fats and Oils Tub margarines without trans fats. Light or  reduced-fat mayonnaise and salad dressings (reduced sodium). Avocado. Safflower, olive, or canola oils. Natural peanut or almond butter. Other Unsalted popcorn and pretzels. The items listed above may not be a complete list of recommended foods or beverages. Contact your dietitian for more options. WHAT FOODS ARE NOT RECOMMENDED? Grains White bread. White pasta. White rice. Refined cornbread. Bagels and croissants. Crackers that contain trans fat. Vegetables Creamed or fried vegetables. Vegetables in a cheese sauce. Regular canned vegetables. Regular canned tomato sauce and paste. Regular tomato and vegetable juices. Fruits Dried fruits. Canned fruit in light or heavy syrup. Fruit juice. Meat and Other Protein Products Fatty cuts of meat. Ribs, chicken wings, bacon, sausage, bologna, salami, chitterlings, fatback, hot dogs, bratwurst, and packaged luncheon meats. Salted nuts and seeds. Canned beans with salt. Dairy Whole or 2% milk, cream, half-and-half, and cream cheese. Whole-fat or sweetened yogurt. Full-fat cheeses or blue cheese. Nondairy creamers and whipped toppings. Processed cheese, cheese spreads, or cheese curds. Condiments Onion and garlic salt, seasoned salt, table salt, and sea salt. Canned and packaged gravies. Worcestershire sauce. Tartar sauce. Barbecue sauce. Teriyaki sauce. Soy sauce, including reduced sodium. Steak sauce. Fish sauce. Oyster sauce. Cocktail sauce. Horseradish. Ketchup and mustard. Meat flavorings and tenderizers. Bouillon cubes. Hot sauce. Tabasco sauce. Marinades. Taco seasonings. Relishes. Fats and Oils Butter, stick margarine, lard, shortening, ghee, and bacon fat. Coconut, palm kernel, or palm oils. Regular salad dressings. Other Pickles and olives. Salted popcorn and pretzels. The items listed above may not be a complete list of foods and beverages to avoid. Contact your dietitian for more information. WHERE CAN I FIND MORE INFORMATION? National Heart,  Lung, and Blood Institute: CableProm[employer].itwww.nhlbi.nih.gov/health/health-topics/topics/dash/   This information is not intended to replace advice given to you by your health care provider. Make sure you discuss any questions you have with your health care provider.   Document Released: 11/10/2011 Document Revised: 12/12/2014 Document Reviewed: 09/25/2013 Elsevier Interactive Patient Education 2016 Elsevier Inc. Insomnia Insomnia is a sleep disorder that makes it difficult to fall asleep or to stay asleep. Insomnia can cause tiredness (fatigue), low energy, difficulty concentrating, mood swings, and poor performance at work or school.  There are three different ways to classify insomnia:  Difficulty falling asleep.  Difficulty staying asleep.  Waking up too early in the morning. Any type of insomnia can be long-term (chronic) or short-term (acute). Both are common. Short-term insomnia usually lasts for three months or less. Chronic insomnia occurs at least three times a week for longer than three months. CAUSES  Insomnia may be caused by another condition, situation, or substance, such as:  Anxiety.  Certain medicines.  Gastroesophageal reflux disease (GERD) or other gastrointestinal conditions.  Asthma or other breathing conditions.  Restless legs syndrome, sleep apnea, or other sleep disorders.  Chronic pain.  Menopause. This may include hot flashes.  Stroke.  Abuse of alcohol, tobacco, or illegal drugs.  Depression.  Caffeine.   Neurological disorders, such as Alzheimer disease.  An overactive thyroid (hyperthyroidism). The cause of insomnia may not be known. RISK FACTORS Risk factors for insomnia include:  Gender. Women are more commonly affected than men.  Age. Insomnia is more common as you get older.  Stress. This may involve your professional or personal life.  Income. Insomnia is more common in people with lower income.  Lack of exercise.   Irregular work  schedule or night shifts.  Traveling between different time zones. SIGNS AND SYMPTOMS If you have insomnia, trouble falling asleep or trouble staying asleep is the main symptom. This may lead to other symptoms, such as:  Feeling fatigued.  Feeling nervous about going to sleep.  Not feeling rested in the morning.  Having trouble concentrating.  Feeling irritable, anxious, or depressed. TREATMENT  Treatment for insomnia depends on the cause. If your insomnia is caused by an underlying condition, treatment will focus on addressing the condition. Treatment may also include:   Medicines to help you sleep.  Counseling or therapy.  Lifestyle adjustments. HOME CARE INSTRUCTIONS   Take medicines only as directed by your health care provider.  Keep regular sleeping and waking hours. Avoid naps.  Keep a sleep diary to help you and your health care provider figure out what could be causing your insomnia. Include:   When you sleep.  When you wake up during the night.  How well you sleep.   How rested you feel the next day.  Any side effects of medicines you are taking.  What you eat and drink.   Make your bedroom a comfortable place where it is easy to fall asleep:  Put up shades or special blackout curtains to block light from outside.  Use a white noise machine to block noise.  Keep the temperature cool.   Exercise regularly as directed by your health care provider. Avoid exercising right before bedtime.  Use relaxation techniques to manage stress. Ask your health care provider to suggest some techniques that may work well for you. These may include:  Breathing exercises.  Routines to release muscle tension.  Visualizing peaceful scenes.  Cut back on alcohol, caffeinated beverages, and cigarettes, especially close to bedtime. These can disrupt your sleep.  Do not overeat or eat spicy foods right before bedtime. This can lead to digestive discomfort that can  make it hard for you to sleep.  Limit screen use before bedtime. This includes:  Watching TV.  Using your smartphone, tablet, and computer.  Stick to a routine. This can help you fall asleep faster. Try to do a quiet activity, brush your teeth, and go to bed at the same time each night.  Get out of bed if you are still awake after 15 minutes of trying to sleep. Keep the lights down, but try reading or doing a quiet activity. When you feel sleepy, go back to bed.  Make sure that you drive carefully. Avoid driving if you feel very sleepy.  Keep all follow-up appointments as directed by your health care provider. This is important. SEEK MEDICAL CARE IF:   You are tired throughout the day or have trouble in your daily routine due to sleepiness.  You continue to have sleep problems or your sleep problems get worse. SEEK IMMEDIATE MEDICAL CARE IF:   You have serious thoughts about hurting yourself or someone else.   This information is not intended to replace advice given to you by your health care provider. Make sure you discuss any questions you have with your health care provider.   Document Released: 11/18/2000 Document Revised: 08/12/2015 Document Reviewed: 08/22/2014 Elsevier Interactive Patient Education [employer]

## 2016-05-31 NOTE — Telephone Encounter (Signed)
Pt states she needs refill on klonopin and flexeril? Pt states you wanted to whin her off of klonopin so what are u going to put her on in place of it?  Also needs refill on zanaflex she states does not take together.  Patient states in pain all the time with fibromyalgia? Per [employer]

## 2016-05-31 NOTE — Progress Notes (Signed)
BP (!) 144/78   Pulse 100   Temp 98 F (36.7 C) (Oral)   Resp 16   Wt 201 lb (91.2 kg)   SpO2 97%   BMI 33.45 kg/m    Subjective:    Patient ID: Victoria Guzman, female    DOB: 08-07-1966, 50 y.o.   MRN: [id number]  HPI: Victoria Guzman is a 50 y.o. female  Chief Complaint  Patient presents with  . Medication Refill   Patient is new to me; her usual provider is out of the office  She has fibromyalgia; flaring in her back; tried lots of different things; diagnosed in 2005; is on disability; some times just has such pain, even to just be touched; tries to do what she can do; hard to do every day; some days she goes back to bed; some good days and bad days; has even tried chiropractor and acupuncture; tried cymbalta as well, did not work  Not taking escitalopram; did not make her feel right; spinny and dizzy  Not sleeping well; has struggled with sleep for more than a decade, since 2005; has tried trazodone, Lunesta (but metallic taste),   Going through divorce; gained 30 pounds over last year; knows it's not her thyroid  She needs refills of her medications; says she has been taking clonazepam 2 mg on her list but not in [place]; zolpidem Feb; clonazepam 2 mg Dec 02, 2015; she says she has been taking it every night; she does not have her bottle with her She says she also just filled the zanaflex 4 mg BID  High calcium noted  Tries to drink a lot of water Low potassium, low sodium, low chloride Going to do green smoothie cleanse to work on weight  May 9741, embolic stroke; 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 shoots little white things; husband was stressing her out; saw eye doctor; going back soon to get her eyes checked; that's the only residual On lisinopril for HTN Lisinopril/hctz 20/12.5 one BID, Jan 31, 2016, June 2017 filled again; she was started on hctz in [place] because of fluid; only taking lisinopril/hctz Not choking, no sleep apnea   Lab Results    Component Value Date   CHOL 180 10/12/2015   CHOL 151 04/23/2014   Lab Results  Component Value Date   HDL 98 10/12/2015   HDL 68 (H) 04/23/2014   Lab Results  Component Value Date   LDLCALC 65 10/12/2015   [place] 65 04/23/2014   Lab Results  Component Value Date   TRIG 87 10/12/2015   TRIG 88 04/23/2014   Lab Results  Component Value Date   CHOLHDL 1.8 10/12/2015   No results found for: LDLDIRECT  Depression screen [facility] 2/9 05/31/2016 10/12/2015  Decreased Interest 0 0  Down, Depressed, Hopeless 1 0  PHQ - 2 Score 1 0   Relevant past medical, surgical, family and social history reviewed Past Medical History:  Diagnosis Date  . Anxiety   . Chronic fatigue   . Chronic insomnia 05/31/2016  . Fibromyalgia   . Insomnia   . Panic attack   . Stroke ([place])   . Vitamin D deficiency 05/31/2016   Past Surgical History:  Procedure Laterality Date  . PARTIAL HYSTERECTOMY     Family History  Problem Relation Age of Onset  . Diabetes Mother   . Heart disease Mother   . Kidney disease Mother     dialysis x 7 years  . Stroke Mother  tiny spot just found incidentally on scan  . Cancer Father     multiple myeloma  Fibromyalgia does NOT run in the family, nor does thyroid disease  Social History  Substance Use Topics  . Smoking status: Never Smoker  . Smokeless tobacco: Not on file  . Alcohol use Yes   Interim medical history since last visit reviewed. Allergies and medications reviewed  Review of Systems Per HPI unless specifically indicated above     Objective:    BP (!) 144/78   Pulse 100   Temp 98 F (36.7 C) (Oral)   Resp 16   Wt 201 lb (91.2 kg)   SpO2 97%   BMI 33.45 kg/m   Wt Readings from Last 3 Encounters:  05/31/16 201 lb (91.2 kg)  10/12/15 192 lb 3 oz (87.2 kg)  08/11/15 189 lb 1.6 oz (85.8 kg)    Physical Exam     Assessment & Plan:   Problem List Items Addressed This Visit      Cardiovascular and Mediastinum   Essential  hypertension    Try DASH guidelines, modest weight loss        Musculoskeletal and Integument   Primary fibromyalgia syndrome     Other   Vitamin D deficiency    Check level      Screening for HIV (human immunodeficiency virus)   Relevant Orders   HIV antibody   Screen for STD (sexually transmitted disease)   Relevant Orders   HSV 1 AND 2 IGM ABS, INDIRECT (Completed)   Hypokalemia   Hypochloremia   Hypercalcemia - Primary   Relevant Orders   VITAMIN D 25 Hydroxy (Vit-D Deficiency, Fractures) (Completed)   Phosphorus (Completed)   BASIC METABOLIC PANEL WITH GFR (Completed)   Chronic insomnia    Discussion about risks of sleeping pills; will only use 5 mg and try to taper      Relevant Orders   Ambulatory referral to Neurology    Other Visit Diagnoses    Medication monitoring encounter       Prediabetes       Relevant Orders   Hemoglobin A1c (Completed)      Follow up plan: Return in about 4 weeks (around 06/28/2016) for follow-up with Dr. Rutherford Nail or Dr. Sanda Klein.  An after-visit summary was printed and given to the patient at [place].  Please see the patient instructions which may contain other information and recommendations beyond what is mentioned above in the assessment and plan.  Meds ordered this encounter  Medications  . zolpidem (AMBIEN) 5 MG tablet    Sig: Take 1 tablet (5 mg total) by mouth at bedtime as needed for sleep.    Dispense:  30 tablet    Refill:  0  . DISCONTD: cyclobenzaprine (FLEXERIL) 10 MG tablet    Sig: Take 1 tablet (10 mg total) by mouth every 8 (eight) hours as needed for muscle spasms.    Dispense:  60 tablet    Refill:  2    Orders Placed This Encounter  Procedures  . VITAMIN D 25 Hydroxy (Vit-D Deficiency, Fractures)  . Hemoglobin A1c  . HIV antibody  . HSV 1 AND 2 IGM ABS, INDIRECT  . Phosphorus  . BASIC METABOLIC PANEL WITH GFR  . HIV antibody  . Ambulatory referral to Neurology

## 2016-06-01 LAB — BASIC METABOLIC PANEL WITH GFR
BUN: 12 mg/dL (ref 7–25)
CO2: 23 mmol/L (ref 20–31)
CREATININE: 0.78 mg/dL (ref 0.50–1.10)
Calcium: 11 mg/dL — ABNORMAL HIGH (ref 8.6–10.2)
Chloride: 101 mmol/L (ref 98–110)
GFR, Est Non African American: >89 mL/min (ref >=60)
GLUCOSE: 107 mg/dL — AB (ref 65–99)
POTASSIUM: 3.8 mmol/L (ref 3.5–5.3)
Sodium: 136 mmol/L (ref 135–146)

## 2016-06-01 LAB — HEMOGLOBIN A1C
Hgb A1c MFr Bld: 5.6 % (ref <5.7)
MEAN PLASMA GLUCOSE: 114 mg/dL

## 2016-06-01 LAB — VITAMIN D 25 HYDROXY (VIT D DEFICIENCY, FRACTURES): Vit D, 25-Hydroxy: 14 ng/mL — ABNORMAL LOW (ref 30–100)

## 2016-06-01 LAB — PHOSPHORUS: PHOSPHORUS: 2.5 mg/dL (ref 2.5–4.5)

## 2016-06-01 LAB — HIV ANTIBODY (ROUTINE TESTING W REFLEX): HIV: NONREACTIVE

## 2016-06-01 NOTE — Telephone Encounter (Signed)
Pt notified upset and wants helen to call her

## 2016-06-02 ENCOUNTER — Ambulatory Visit: Payer: Managed Care, Other (non HMO) | Admitting: Family Medicine

## 2016-06-06 ENCOUNTER — Telehealth: Payer: Self-pay | Admitting: Family Medicine

## 2016-06-06 LAB — HSV 1 AND 2 IGM ABS, INDIRECT
HSV1IGM: NEGATIVE
HSV2IGM: NEGATIVE

## 2016-06-06 MED ORDER — VITAMIN D (ERGOCALCIFEROL) 1.25 MG (50000 UNIT) PO CAPS: 50000.0000 [IU] | ORAL_CAPSULE | ORAL | Status: AC

## 2016-06-06 NOTE — Telephone Encounter (Signed)
I called, but patient driving; I'll call her later

## 2016-06-08 NOTE — Telephone Encounter (Signed)
I sent MyChart message.

## 2016-06-28 ENCOUNTER — Ambulatory Visit: Payer: Managed Care, Other (non HMO) | Admitting: Family Medicine

## 2016-07-14 ENCOUNTER — Ambulatory Visit: Payer: Managed Care, Other (non HMO) | Admitting: Family Medicine

## 2016-07-26 ENCOUNTER — Other Ambulatory Visit: Payer: Self-pay | Admitting: Family Medicine

## 2016-08-05 NOTE — Assessment & Plan Note (Signed)
Discussion about risks of sleeping pills; will only use 5 mg and try to taper

## 2016-08-05 NOTE — Assessment & Plan Note (Signed)
Check level 

## 2016-08-05 NOTE — Assessment & Plan Note (Signed)
Try DASH guidelines, modest weight loss

## 2016-09-04 ENCOUNTER — Telehealth: Payer: Self-pay | Admitting: Family Medicine

## 2016-09-04 NOTE — Telephone Encounter (Signed)
Please call patient She is overdue for visit and labs She had high calcium and low vitamin D, and we need to see her and recheck those, along with her BP She cancelled last two visits here and it's important that we see her please Thank you

## 2016-09-05 NOTE — Telephone Encounter (Signed)
LMOM for pt to call and schedule an appt.

## 2016-12-27 ENCOUNTER — Other Ambulatory Visit: Payer: Self-pay | Admitting: Family Medicine

## 2016-12-29 ENCOUNTER — Ambulatory Visit
Admission: EM | Admit: 2016-12-29 | Discharge: 2016-12-29 | Disposition: A | Discharge status: Home / Self Care | Payer: Managed Care, Other (non HMO) | Attending: Family Medicine | Admitting: Family Medicine

## 2016-12-29 DIAGNOSIS — J45909 Unspecified asthma, uncomplicated: Secondary | ICD-10-CM | POA: Diagnosis not present

## 2016-12-29 DIAGNOSIS — R05 Cough: Secondary | ICD-10-CM

## 2016-12-29 DIAGNOSIS — G4709 Other insomnia: Secondary | ICD-10-CM

## 2016-12-29 DIAGNOSIS — J069 Acute upper respiratory infection, unspecified: Secondary | ICD-10-CM | POA: Diagnosis not present

## 2016-12-29 DIAGNOSIS — R059 Cough, unspecified: Secondary | ICD-10-CM

## 2016-12-29 DIAGNOSIS — M797 Fibromyalgia: Secondary | ICD-10-CM

## 2016-12-29 DIAGNOSIS — I10 Essential (primary) hypertension: Secondary | ICD-10-CM

## 2016-12-29 MED ORDER — PREDNISONE 10 MG (21) PO TBPK: ORAL_TABLET | ORAL | 0 refills | Status: DC

## 2016-12-29 MED ORDER — LISINOPRIL 20 MG PO TABS: 20.0000 mg | ORAL_TABLET | Freq: Every day | ORAL | 0 refills | Status: DC

## 2016-12-29 MED ORDER — TRAMADOL HCL 50 MG PO TABS: 50.0000 mg | ORAL_TABLET | Freq: Four times a day (QID) | ORAL | 0 refills | Status: AC | PRN

## 2016-12-29 MED ORDER — ZOLPIDEM TARTRATE ER 12.5 MG PO TBCR: 12.5000 mg | EXTENDED_RELEASE_TABLET | Freq: Every evening | ORAL | 0 refills | Status: DC | PRN

## 2016-12-29 MED ORDER — CLONAZEPAM 1 MG PO TABS: 1.0000 mg | ORAL_TABLET | Freq: Two times a day (BID) | ORAL | 0 refills | Status: DC

## 2016-12-29 MED ORDER — TIZANIDINE HCL 4 MG PO CAPS: 4.0000 mg | ORAL_CAPSULE | Freq: Three times a day (TID) | ORAL | 0 refills | Status: DC

## 2016-12-29 MED ORDER — HYDROCOD POLST-CPM POLST ER 10-8 MG/5ML PO SUER: 5.0000 mL | Freq: Two times a day (BID) | ORAL | 0 refills | Status: DC | PRN

## 2016-12-29 NOTE — ED Triage Notes (Signed)
Patient complains of cough and wheezing x 2 weeks. Patient states that she has not been sleeping and reports that the cough is worse at night.

## 2016-12-29 NOTE — ED Provider Notes (Signed)
[facility]    CSN: [id number] Arrival date & time: 12/29/16  0813     History   Chief Complaint Chief Complaint  Patient presents with  . Cough    HPI Victoria Guzman is a 51 y.o. female.   Patient is a 51 year old black female with multiple issues medical concerns.   #1 she's had a cough and congestion for about a week. History of asthma. History of recurrent bronchitis. She states that normally when this happens she gets prednisone and Tussionex for the coughing. She usually doesn't need to be on antibiotics she states the coughing has been keeping her up and keeping her from getting any type of rest stenosis constant she does have an albuterol inhaler which she uses at home. #2 chronic pain fibromyalgia chronic fatigue. She has a long-standing history of chronic fatigue fibromyalgia she is on disability I know this patient well since I saw her in my private practice over 10 years ago. Saw her about 3 years ago on a somewhat regular basis by Dr. Lucita Lora was out in May helping her obtain her disability one was at the urgent care. Since Dr. Lucita Lora has retired in June she was down in [place] with her husband and family but now she and her husband have in their marriage and she is divorced she's moved back to [place]. Unfortunately since she's been back [place] she is unable to get the same medication she was on before. She states going to Dr. Darin Engels office and the doctors they're refusing to renew her medications even though she's been on tramadol Ambien 12.5 mg and Klonopin for number of years I have prescribed medications for the past she's safely used so as Dr. Lucita Lora. I strongly suggested to her that she go find a pain doctor to be evaluated and treated for pain since apparently a lot of doctors and practice now especially no doctors are very reluctant or comfortable in issuing and treating chronic pain and using opiates and benzodiazepines  together. She states that the PCP and Dr. Darin Engels office placed on Ambien 5 mg she did not feel comfortable using 12.5 mg to help her sleep at night. She states 5 mg does nothing to help her sleep before she took 12.5 mg on a regular basis diabetes unable to sleep right now she feels completely worn out because to the cough and her inability to sleep she feels exhausted.      The history is provided by the patient. No language interpreter was used.    Past Medical History:  Diagnosis Date  . Anxiety   . Chronic fatigue   . Chronic insomnia 05/31/2016  . Fibromyalgia   . Insomnia   . Panic attack   . Stroke ([place])   . Vitamin D deficiency 05/31/2016    Patient Active Problem List   Diagnosis Date Noted  . Hypercalcemia 05/31/2016  . Hypokalemia 05/31/2016  . Hypochloremia 05/31/2016  . Screening for HIV (human immunodeficiency virus) 05/31/2016  . Screen for STD (sexually transmitted disease) 05/31/2016  . Vitamin D deficiency 05/31/2016  . Chronic insomnia 05/31/2016  . Absolute anemia 07/07/2015  . Clinical depression 07/07/2015  . Essential hypertension 07/07/2015  . Acute anxiety 07/07/2015  . Primary fibromyalgia syndrome 07/07/2015  . Embolic stroke ([place]) 84/13/2440    Past Surgical History:  Procedure Laterality Date  . PARTIAL HYSTERECTOMY      OB History    No data available       Home  Medications    Prior to Admission medications   Medication Sig Start Date End Date Taking? Authorizing Provider  aspirin ([employer] ASPIRIN LOW DOSE) 81 MG EC tablet Take 1 tablet (81 mg total) by mouth daily. 07/07/15  Yes Ashok Norris, MD  clonazePAM (KLONOPIN) 1 MG tablet Take 1 mg by mouth 2 (two) times daily.   Yes Historical Provider, MD  cyclobenzaprine (FLEXERIL) 10 MG tablet Take 10 mg by mouth 3 (three) times daily as needed for muscle spasms.   Yes Historical Provider, MD  lisinopril (PRINIVIL,ZESTRIL) 20 MG tablet Take 1 tablet (20 mg total) by mouth daily. 10/16/15   Yes Roselee Nova, MD  tiZANidine (ZANAFLEX) 4 MG capsule Take 4 mg by mouth 3 (three) times daily.   Yes Historical Provider, MD  zolpidem (AMBIEN) 5 MG tablet Take 1 tablet (5 mg total) by mouth at bedtime as needed for sleep. 05/31/16  Yes Arnetha Courser, MD  albuterol (PROAIR HFA) 108 (90 BASE) MCG/ACT inhaler Inhale 2 puffs into the lungs as needed. 08/11/15   Ashok Norris, MD  chlorpheniramine-HYDROcodone (TUSSIONEX PENNKINETIC ER) 10-8 MG/5ML SUER Take 5 mLs by mouth every 12 (twelve) hours as needed. 12/29/16   Frederich Cha, MD  clonazePAM (KLONOPIN) 1 MG tablet Take 1 tablet (1 mg total) by mouth 2 (two) times daily. 12/29/16   Frederich Cha, MD  lisinopril (PRINIVIL,ZESTRIL) 20 MG tablet Take 1 tablet (20 mg total) by mouth daily. 12/29/16   Frederich Cha, MD  predniSONE (STERAPRED UNI-PAK 21 TAB) 10 MG (21) TBPK tablet Sig 6 tablet day 1, 5 tablets day 2, 4 tablets day 3,,3tablets day 4, 2 tablets day 5, 1 tablet day 6 take all tablets orally 12/29/16   Frederich Cha, MD  tiZANidine (ZANAFLEX) 4 MG capsule Take 1 capsule (4 mg total) by mouth 3 (three) times daily. 12/29/16   Frederich Cha, MD  traMADol (ULTRAM) 50 MG tablet Take 1 tablet (50 mg total) by mouth every 6 (six) hours as needed. 12/29/16 01/03/17  Frederich Cha, MD  zolpidem (AMBIEN CR) 12.5 MG CR tablet Take 1 tablet (12.5 mg total) by mouth at bedtime as needed for sleep. 12/29/16   Frederich Cha, MD    Family History Family History  Problem Relation Age of Onset  . Diabetes Mother   . Heart disease Mother   . Kidney disease Mother     dialysis x 7 years  . Stroke Mother     tiny spot just found incidentally on scan  . Cancer Father     multiple myeloma    Social History Social History  Substance Use Topics  . Smoking status: Never Smoker  . Smokeless tobacco: Never Used  . Alcohol use No     Allergies   Quinolones and Moxifloxacin   Review of Systems Review of Systems  HENT: Positive for congestion and rhinorrhea.  Negative for sinus pain.   Respiratory: Positive for cough, shortness of breath and wheezing.   Cardiovascular: Negative for chest pain.  Musculoskeletal: Positive for arthralgias and myalgias.  Psychiatric/Behavioral: The patient is nervous/anxious.   All other systems reviewed and are negative.    Physical Exam Triage Vital Signs ED Triage Vitals  Enc Vitals Group     BP 12/29/16 0826 (!) 169/92     Pulse Rate 12/29/16 0826 92     Resp 12/29/16 0826 16     Temp 12/29/16 0826 98.7 F (37.1 C)     Temp Source 12/29/16 0826 Oral  SpO2 12/29/16 0826 100 %     Weight 12/29/16 0824 208 lb (94.3 kg)     Height 12/29/16 0824 _0  (1.676 m)     Head Circumference --      Peak Flow --      Pain Score 12/29/16 0826 8     Pain Loc --      Pain Edu? --      Excl. in [place]? --    No data found.   Updated Vital Signs BP (!) 169/92 (BP Location: Left Arm)   Pulse 92   Temp 98.7 F (37.1 C) (Oral)   Resp 16   Ht _1  (1.676 m)   Wt 208 lb (94.3 kg)   SpO2 100%   BMI 33.57 kg/m   Visual Acuity Right Eye Distance:   Left Eye Distance:   Bilateral Distance:    Right Eye Near:   Left Eye Near:    Bilateral Near:     Physical Exam  Constitutional: She is oriented to person, place, and time. She appears well-developed and well-nourished.  HENT:  Head: Normocephalic and atraumatic.  Right Ear: External ear normal.  Left Ear: External ear normal.  Mouth/Throat: Oropharynx is clear and moist.  Eyes: Pupils are equal, round, and reactive to light.  Neck: Normal range of motion. Neck supple.  Cardiovascular: Normal rate and regular rhythm.   Pulmonary/Chest: She has wheezes.  Actively coughing  Musculoskeletal: Normal range of motion. She exhibits tenderness. She exhibits no edema or deformity.  Diffuse body tenderness  Lymphadenopathy:    She has cervical adenopathy.  Neurological: She is alert and oriented to person, place, and time.  Skin: Skin is warm. No rash noted.  She is not diaphoretic. No erythema.  Psychiatric: She has a normal mood and affect.  Vitals reviewed.    UC Treatments / Results  Labs (all labs ordered are listed, but only abnormal results are displayed) Labs Reviewed - No data to display  EKG  EKG Interpretation None       Radiology No results found.  Procedures Procedures (including critical care time)  Medications Ordered in UC Medications - No data to display   Initial Impression / Assessment and Plan / UC Course  I have reviewed the triage vital signs and the nursing notes.  Pertinent labs & imaging results that were available during my care of the patient were reviewed by me and considered in my medical decision making (see chart for details).   at this time will going to renew her blood pressure medicine with fosinopril No. 2 for her bronchitis will place on Tussionex and 6 day course of prednisone No. 3 for the fibromyalgia insomnia chronic pain on renewing her Klonopin for month 1 mg twice a day her Zanaflex 4 mg 3 times a day.we'll put on Zanaflex and Flexeril together she's been on tramadol I can give her 20 blood per the [facility] requirements are going give her 5 days of a narcotic. She was checked drug reporting site and has not been on tramadol for a while she has been on the antianxiety agents. she states that she was going be switched to xanax but i feel the klonopin with him number of medications will be better for her to keep her 1 mg twice a day follow-up with the pain clinic doctor and either her current pcp her new pcp.    Final Clinical Impressions(s) / UC Diagnoses   Final diagnoses:  Cough  Other  insomnia  Upper respiratory tract infection, unspecified type  Asthma, unspecified asthma severity, unspecified whether complicated, unspecified whether persistent  Fibromyalgia  Essential hypertension    New Prescriptions Discharge Medication List as of 12/29/2016  8:57 AM    START taking  these medications   Details  chlorpheniramine-HYDROcodone (TUSSIONEX PENNKINETIC ER) 10-8 MG/5ML SUER Take 5 mLs by mouth every 12 (twelve) hours as needed., Starting Thu 12/29/2016, Print    !! clonazePAM (KLONOPIN) 1 MG tablet Take 1 tablet (1 mg total) by mouth 2 (two) times daily., Starting Thu 12/29/2016, Print    !! lisinopril (PRINIVIL,ZESTRIL) 20 MG tablet Take 1 tablet (20 mg total) by mouth daily., Starting Thu 12/29/2016, Print    predniSONE (STERAPRED UNI-PAK 21 TAB) 10 MG (21) TBPK tablet Sig 6 tablet day 1, 5 tablets day 2, 4 tablets day 3,,3tablets day 4, 2 tablets day 5, 1 tablet day 6 take all tablets orally, Print    !! tiZANidine (ZANAFLEX) 4 MG capsule Take 1 capsule (4 mg total) by mouth 3 (three) times daily., Starting Thu 12/29/2016, Print    traMADol (ULTRAM) 50 MG tablet Take 1 tablet (50 mg total) by mouth every 6 (six) hours as needed., Starting Thu 12/29/2016, Until Tue 01/03/2017, Normal    zolpidem (AMBIEN CR) 12.5 MG CR tablet Take 1 tablet (12.5 mg total) by mouth at bedtime as needed for sleep., Starting Thu 12/29/2016, Print     !! - Potential duplicate medications found. Please discuss with provider.       Note: This dictation was prepared with Dragon dictation along with smaller phrase technology. Any transcriptional errors that result from this process are unintentional.   Frederich Cha, MD 12/29/16 1135

## 2017-01-04 ENCOUNTER — Ambulatory Visit
Admission: EM | Admit: 2017-01-04 | Discharge: 2017-01-04 | Disposition: A | Discharge status: Home / Self Care | Payer: Managed Care, Other (non HMO) | Attending: Family Medicine | Admitting: Family Medicine

## 2017-01-04 ENCOUNTER — Ambulatory Visit (INDEPENDENT_AMBULATORY_CARE_PROVIDER_SITE_OTHER): Payer: Managed Care, Other (non HMO)

## 2017-01-04 ENCOUNTER — Encounter: Payer: Self-pay | Admitting: *Deleted

## 2017-01-04 DIAGNOSIS — J209 Acute bronchitis, unspecified: Secondary | ICD-10-CM

## 2017-01-04 DIAGNOSIS — J45901 Unspecified asthma with (acute) exacerbation: Secondary | ICD-10-CM

## 2017-01-04 LAB — RAPID STREP SCREEN (MED CTR MEBANE ONLY): Streptococcus, Group A Screen (Direct): NEGATIVE

## 2017-01-04 MED ORDER — METHYLPREDNISOLONE SODIUM SUCC 125 MG IJ SOLR
125.0000 mg | Freq: Once | INTRAMUSCULAR | Status: AC
  Administered 2017-01-04: 125 mg via INTRAMUSCULAR

## 2017-01-04 MED ORDER — FLUCONAZOLE 150 MG PO TABS
150.0000 mg | ORAL_TABLET | Freq: Once | ORAL | 1 refills | Status: AC
Start: 2017-01-04 — End: 2017-01-04

## 2017-01-04 MED ORDER — PREDNISONE 10 MG (21) PO TBPK: ORAL_TABLET | ORAL | 0 refills | Status: DC

## 2017-01-04 MED ORDER — IPRATROPIUM-ALBUTEROL 0.5-2.5 (3) MG/3ML IN SOLN
3.0000 mL | Freq: Once | RESPIRATORY_TRACT | Status: AC
  Administered 2017-01-04: 3 mL via RESPIRATORY_TRACT

## 2017-01-04 MED ORDER — AZITHROMYCIN 500 MG PO TABS: ORAL_TABLET | ORAL | 0 refills | Status: DC

## 2017-01-04 MED ORDER — TERCONAZOLE 80 MG VA SUPP: 80.0000 mg | Freq: Every day | VAGINAL | 0 refills | Status: DC

## 2017-01-04 NOTE — ED Triage Notes (Signed)
Patient continues to have the same symptoms as during her visit on 12/29/16.

## 2017-01-04 NOTE — ED Provider Notes (Signed)
[facility]    CSN: [id number] Arrival date & time: 01/04/17  1142     History   Chief Complaint Chief Complaint  Patient presents with  . Shortness of Breath  . Dizziness    HPI Victoria Guzman is a 51 y.o. female.   Patient is a 51 year old black female with a history of asthma with recurrent bronchospasm and bronchitis. She was seen last week for other issues which is covered but she also had a cold and cough and congestion we held off placing her on anabiotic place on prednisone Dosepak and renewed Tussionex prescription that usually works for her. This time she stooled the prednisone stopped that about a day or 2 ago but she still coughing still having congestion last night he got a lot worse. She had difficulty breathing except when she was sitting up in a reclining chair. Multiple medical problems please see previous charts no pertinent family medical history relevant to today's visit. Mother died of renal failure and diabetes. Father died of systemic cancer. She is allergic to quinolones and moxifloxacin. She does not smoke. She also reports having a thick heavy vaginal discharge she states is similar to when she's had decent infection before but this time she is not taking antibiotics. She's currently not sexually active since she husband have separated about a 6 months to year ago and she is finally gotten papers finalized recently.   The history is provided by the patient. No language interpreter was used.  Shortness of Breath  Severity:  Severe Onset quality:  Gradual Timing:  Constant Progression:  Worsening Chronicity:  New Context: URI   Context: not known allergens   Relieved by:  Nothing Worsened by:  Activity and coughing Ineffective treatments:  Inhaler and sitting up Associated symptoms: sputum production and wheezing   Associated symptoms: no rash   Dizziness  Associated symptoms: shortness of breath     Past Medical History:    Diagnosis Date  . Anxiety   . Chronic fatigue   . Chronic insomnia 05/31/2016  . Fibromyalgia   . Insomnia   . Panic attack   . Stroke ([place])   . Vitamin D deficiency 05/31/2016    Patient Active Problem List   Diagnosis Date Noted  . Hypercalcemia 05/31/2016  . Hypokalemia 05/31/2016  . Hypochloremia 05/31/2016  . Screening for HIV (human immunodeficiency virus) 05/31/2016  . Screen for STD (sexually transmitted disease) 05/31/2016  . Vitamin D deficiency 05/31/2016  . Chronic insomnia 05/31/2016  . Absolute anemia 07/07/2015  . Clinical depression 07/07/2015  . Essential hypertension 07/07/2015  . Acute anxiety 07/07/2015  . Primary fibromyalgia syndrome 07/07/2015  . Embolic stroke ([place]) 82/50/5397    Past Surgical History:  Procedure Laterality Date  . PARTIAL HYSTERECTOMY      OB History    No data available       Home Medications    Prior to Admission medications   Medication Sig Start Date End Date Taking? Authorizing Provider  albuterol (PROAIR HFA) 108 (90 BASE) MCG/ACT inhaler Inhale 2 puffs into the lungs as needed. 08/11/15  Yes Ashok Norris, MD  aspirin ([employer] ASPIRIN LOW DOSE) 81 MG EC tablet Take 1 tablet (81 mg total) by mouth daily. 07/07/15  Yes Ashok Norris, MD  chlorpheniramine-HYDROcodone (TUSSIONEX PENNKINETIC ER) 10-8 MG/5ML SUER Take 5 mLs by mouth every 12 (twelve) hours as needed. 12/29/16  Yes Frederich Cha, MD  clonazePAM (KLONOPIN) 1 MG tablet Take 1 tablet (1 mg total) by  mouth 2 (two) times daily. 12/29/16  Yes Frederich Cha, MD  lisinopril (PRINIVIL,ZESTRIL) 20 MG tablet Take 1 tablet (20 mg total) by mouth daily. 12/29/16  Yes Frederich Cha, MD  tiZANidine (ZANAFLEX) 4 MG capsule Take 1 capsule (4 mg total) by mouth 3 (three) times daily. 12/29/16  Yes Frederich Cha, MD  zolpidem (AMBIEN) 5 MG tablet Take 1 tablet (5 mg total) by mouth at bedtime as needed for sleep. 05/31/16  Yes Arnetha Courser, MD  clonazePAM (KLONOPIN) 1 MG tablet Take 1 mg by  mouth 2 (two) times daily.    Historical Provider, MD  cyclobenzaprine (FLEXERIL) 10 MG tablet Take 10 mg by mouth 3 (three) times daily as needed for muscle spasms.    Historical Provider, MD  lisinopril (PRINIVIL,ZESTRIL) 20 MG tablet Take 1 tablet (20 mg total) by mouth daily. 10/16/15   Roselee Nova, MD  predniSONE (STERAPRED UNI-PAK 21 TAB) 10 MG (21) TBPK tablet Sig 6 tablet day 1, 5 tablets day 2, 4 tablets day 3,,3tablets day 4, 2 tablets day 5, 1 tablet day 6 take all tablets orally 12/29/16   Frederich Cha, MD  tiZANidine (ZANAFLEX) 4 MG capsule Take 4 mg by mouth 3 (three) times daily.    Historical Provider, MD  zolpidem (AMBIEN CR) 12.5 MG CR tablet Take 1 tablet (12.5 mg total) by mouth at bedtime as needed for sleep. 12/29/16   Frederich Cha, MD    Family History Family History  Problem Relation Age of Onset  . Diabetes Mother   . Heart disease Mother   . Kidney disease Mother     dialysis x 7 years  . Stroke Mother     tiny spot just found incidentally on scan  . Cancer Father     multiple myeloma    Social History Social History  Substance Use Topics  . Smoking status: Never Smoker  . Smokeless tobacco: Never Used  . Alcohol use No     Allergies   Quinolones and Moxifloxacin   Review of Systems Review of Systems  Respiratory: Positive for sputum production, shortness of breath and wheezing.   Genitourinary: Positive for vaginal discharge.  Skin: Negative for rash.  Neurological: Positive for dizziness.  All other systems reviewed and are negative.    Physical Exam Triage Vital Signs ED Triage Vitals  Enc Vitals Group     BP 01/04/17 1412 (!) 157/89     Pulse Rate 01/04/17 1412 (!) 110     Resp 01/04/17 1412 18     Temp 01/04/17 1412 98.7 F (37.1 C)     Temp Source 01/04/17 1412 Oral     SpO2 01/04/17 1412 99 %     Weight 01/04/17 1413 208 lb (94.3 kg)     Height 01/04/17 1413 '5\' 6"'  (1.676 m)     Head Circumference --      Peak Flow --       Pain Score 01/04/17 1419 7     Pain Loc --      Pain Edu? --      Excl. in [place]? --    No data found.   Updated Vital Signs BP (!) 157/89 (BP Location: Left Arm)   Pulse (!) 110   Temp 98.7 F (37.1 C) (Oral)   Resp 18   Ht '5\' 6"'  (1.676 m)   Wt 208 lb (94.3 kg)   SpO2 99%   BMI 33.57 kg/m   Visual Acuity Right Eye Distance:  Left Eye Distance:   Bilateral Distance:    Right Eye Near:   Left Eye Near:    Bilateral Near:     Physical Exam  Constitutional: She is oriented to person, place, and time. She appears well-developed and well-nourished. She is active.  Non-toxic appearance. She has a sickly appearance. No distress.  HENT:  Head: Normocephalic and atraumatic.  Right Ear: Hearing, tympanic membrane, external ear and ear canal normal.  Left Ear: Hearing, tympanic membrane, external ear and ear canal normal.  Nose: Mucosal edema present.  Mouth/Throat: Uvula is midline. Uvula swelling present. Posterior oropharyngeal erythema present.  Eyes: EOM are normal. Pupils are equal, round, and reactive to light.  Neck: Normal range of motion. Neck supple. No thyromegaly present.  Cardiovascular: Normal rate, regular rhythm and normal heart sounds.   Pulmonary/Chest: Effort normal and breath sounds normal.  Musculoskeletal: Normal range of motion.  Neurological: She is alert and oriented to person, place, and time.  Skin: Skin is warm.  Psychiatric: She has a normal mood and affect.  Vitals reviewed.    UC Treatments / Results  Labs (all labs ordered are listed, but only abnormal results are displayed) Labs Reviewed  RAPID STREP SCREEN (NOT AT [facility])    EKG  EKG Interpretation None       Radiology No results found.  Procedures Procedures (including critical care time)  Medications Ordered in UC Medications  methylPREDNISolone sodium succinate (SOLU-MEDROL) 125 mg/2 mL injection 125 mg (not administered)  ipratropium-albuterol (DUONEB) 0.5-2.5 (3) MG/3ML  nebulizer solution 3 mL (not administered)     Initial Impression / Assessment and Plan / UC Course  I have reviewed the triage vital signs and the nursing notes.  Pertinent labs & imaging results that were available during my care of the patient were reviewed by me and considered in my medical decision making (see chart for details).   will Mr. patient Solu-Medrol IM nowa course of prednisone will give her Diflucan for yeast and a prescription for Terazol vaginal suppositories that does work is at the maximum dose of 500 mg 1 tablet daily for 6 days and have follow-up as needed when necessary chest x-rays pending chest x-ray is abnormal will switch from Zithromax to Levaquin    Final Clinical Impressions(s) / UC Diagnoses   Final diagnoses:  None    New Prescriptions New Prescriptions   No medications on file     Frederich Cha, MD 01/04/17 1554

## 2017-01-07 LAB — CULTURE, GROUP A STREP (THRC)

## 2017-01-17 ENCOUNTER — Ambulatory Visit (INDEPENDENT_AMBULATORY_CARE_PROVIDER_SITE_OTHER): Payer: Managed Care, Other (non HMO)

## 2017-01-17 ENCOUNTER — Ambulatory Visit
Admission: EM | Admit: 2017-01-17 | Discharge: 2017-01-17 | Disposition: A | Discharge status: Home / Self Care | Payer: Managed Care, Other (non HMO) | Attending: Family Medicine | Admitting: Family Medicine

## 2017-01-17 DIAGNOSIS — J209 Acute bronchitis, unspecified: Secondary | ICD-10-CM

## 2017-01-17 DIAGNOSIS — R05 Cough: Secondary | ICD-10-CM

## 2017-01-17 DIAGNOSIS — R059 Cough, unspecified: Secondary | ICD-10-CM

## 2017-01-17 DIAGNOSIS — N760 Acute vaginitis: Secondary | ICD-10-CM

## 2017-01-17 LAB — WET PREP, GENITAL
Clue Cells Wet Prep HPF POC: NONE SEEN
SPERM: NONE SEEN
Trich, Wet Prep: NONE SEEN
YEAST WET PREP: NONE SEEN

## 2017-01-17 LAB — CHLAMYDIA/NGC RT PCR (ARMC ONLY)
CHLAMYDIA TR: NOT DETECTED
N GONORRHOEAE: NOT DETECTED

## 2017-01-17 MED ORDER — FLUCONAZOLE 200 MG PO TABS: 200.0000 mg | ORAL_TABLET | Freq: Every day | ORAL | 0 refills | Status: AC

## 2017-01-17 MED ORDER — ALBUTEROL SULFATE HFA 108 (90 BASE) MCG/ACT IN AERS: 2.0000 | INHALATION_SPRAY | RESPIRATORY_TRACT | 1 refills | Status: DC | PRN

## 2017-01-17 MED ORDER — ZOLPIDEM TARTRATE ER 12.5 MG PO TBCR: 12.5000 mg | EXTENDED_RELEASE_TABLET | Freq: Every evening | ORAL | 0 refills | Status: DC | PRN

## 2017-01-17 MED ORDER — ESTRADIOL 0.1 MG/GM VA CREA: 1.0000 | TOPICAL_CREAM | Freq: Every day | VAGINAL | 1 refills | Status: DC

## 2017-01-17 MED ORDER — IPRATROPIUM-ALBUTEROL 0.5-2.5 (3) MG/3ML IN SOLN
3.0000 mL | Freq: Once | RESPIRATORY_TRACT | Status: AC
  Administered 2017-01-17: 3 mL via RESPIRATORY_TRACT

## 2017-01-17 MED ORDER — CEFUROXIME AXETIL 500 MG PO TABS: 500.0000 mg | ORAL_TABLET | Freq: Two times a day (BID) | ORAL | 0 refills | Status: DC

## 2017-01-17 MED ORDER — HYDROCOD POLST-CPM POLST ER 10-8 MG/5ML PO SUER: 5.0000 mL | Freq: Two times a day (BID) | ORAL | 0 refills | Status: DC | PRN

## 2017-01-17 MED ORDER — KETOCONAZOLE 2 % EX CREA: 1.0000 "application " | TOPICAL_CREAM | Freq: Two times a day (BID) | CUTANEOUS | 3 refills | Status: DC

## 2017-01-17 NOTE — ED Triage Notes (Signed)
Patient complains of cough and hoarseness. Patient states that symptoms have not improved. Patient states that the medications have caused a severe yeast infection.

## 2017-01-17 NOTE — ED Provider Notes (Signed)
[facility]    CSN: [id number] Arrival date & time: 01/17/17  0815     History   Chief Complaint Chief Complaint  Patient presents with  . Cough    HPI Victoria Guzman is a 51 y.o. female.   Patient is here today because of multiple problems she feels miserable she states the cough has still persists she still coughing up yellowish-green phlegm things have gotten better but not completely cleared. While she was taking the Tussionex she was bringing stuff up but with the Delsym she doesn't seem to be bringing stuff up like she did with the Tussionex. She is also found out that her IV on inhalers of expired and she is concerned about that as well. She is also had a vaginal discharge and despite the Terazol vaginal suppositories she states continues to have a discharge. States discharge gotten so heavy that is basically pours out of her vagina. She is afraid to urinate because it burns when she urinates and she states the whole area is just feels raw. Concern over her last sexual encounter since she's divorced now from her husband and she is found that he was been unfaithful. She reassures me that she's had HIV testing a herpetic testing and other testing back in November and no signs of any STD or found at that time. He has been several months since she's sexually active in fact he was last February she states the last time she was sexually active with him. He is also planning to move out the area down to [place] we talked again establish a PCP since she is only moving in the month to [place] she is going need have time to get a PCP established and was no bowel we prescribed her state medication. We talked about the need to get a chest x-ray to make sure this bronchitis is not up to pneumonia. If she has pneumonia we will need to start on antibiotics right away if this is bronchitis unremarkable. On antibiotics for a few days to see how well she does with the Tussionex. She is  reluctant take more antibiotics since she's had this yeast infection and vaginal irritation   The history is provided by the patient. No language interpreter was used.  Cough  Cough characteristics:  Productive and barking Sputum characteristics:  Nondescript Severity:  Moderate Onset quality:  Sudden Timing:  Constant Progression:  Worsening Chronicity:  New Smoker: no   Context: smoke exposure and upper respiratory infection   Relieved by:  Cough suppressants Worsened by:  Nothing Ineffective treatments:  None tried Associated symptoms: shortness of breath     Past Medical History:  Diagnosis Date  . Anxiety   . Chronic fatigue   . Chronic insomnia 05/31/2016  . Fibromyalgia   . Insomnia   . Panic attack   . Stroke ([place])   . Vitamin D deficiency 05/31/2016    Patient Active Problem List   Diagnosis Date Noted  . Hypercalcemia 05/31/2016  . Hypokalemia 05/31/2016  . Hypochloremia 05/31/2016  . Screening for HIV (human immunodeficiency virus) 05/31/2016  . Screen for STD (sexually transmitted disease) 05/31/2016  . Vitamin D deficiency 05/31/2016  . Chronic insomnia 05/31/2016  . Absolute anemia 07/07/2015  . Clinical depression 07/07/2015  . Essential hypertension 07/07/2015  . Acute anxiety 07/07/2015  . Primary fibromyalgia syndrome 07/07/2015  . Embolic stroke ([place]) 64/68/0321    Past Surgical History:  Procedure Laterality Date  . PARTIAL HYSTERECTOMY  OB History    No data available       Home Medications    Prior to Admission medications   Medication Sig Start Date End Date Taking? Authorizing Provider  albuterol (PROAIR HFA) 108 (90 BASE) MCG/ACT inhaler Inhale 2 puffs into the lungs as needed. 08/11/15  Yes Ashok Norris, MD  aspirin ([facility] ASPIRIN LOW DOSE) 81 MG EC tablet Take 1 tablet (81 mg total) by mouth daily. 07/07/15  Yes Ashok Norris, MD  clonazePAM (KLONOPIN) 1 MG tablet Take 1 tablet (1 mg total) by mouth 2 (two) times daily.  12/29/16  Yes Frederich Cha, MD  cyclobenzaprine (FLEXERIL) 10 MG tablet Take 10 mg by mouth 3 (three) times daily as needed for muscle spasms.   Yes Historical Provider, MD  lisinopril (PRINIVIL,ZESTRIL) 20 MG tablet Take 1 tablet (20 mg total) by mouth daily. 12/29/16  Yes Frederich Cha, MD  tiZANidine (ZANAFLEX) 4 MG capsule Take 1 capsule (4 mg total) by mouth 3 (three) times daily. 12/29/16  Yes Frederich Cha, MD  albuterol (PROVENTIL HFA;VENTOLIN HFA) 108 (90 Base) MCG/ACT inhaler Inhale 2 puffs into the lungs every 4 (four) hours as needed for wheezing or shortness of breath. 01/17/17   Frederich Cha, MD  azithromycin (ZITHROMAX) 500 MG tablet 1 tablet daily 01/04/17   Frederich Cha, MD  cefUROXime (CEFTIN) 500 MG tablet Take 1 tablet (500 mg total) by mouth 2 (two) times daily. His cough becomes worse and congestion doesn't improve 01/17/17   Frederich Cha, MD  chlorpheniramine-HYDROcodone [facility] ER) 10-8 MG/5ML SUER Take 5 mLs by mouth every 12 (twelve) hours as needed. 12/29/16   Frederich Cha, MD  chlorpheniramine-HYDROcodone [facility] PENNKINETIC ER) 10-8 MG/5ML SUER Take 5 mLs by mouth every 12 (twelve) hours as needed for cough. 01/17/17   Frederich Cha, MD  clonazePAM (KLONOPIN) 1 MG tablet Take 1 mg by mouth 2 (two) times daily.    Historical Provider, MD  fluconazole (DIFLUCAN) 200 MG tablet Take 1 tablet (200 mg total) by mouth daily. 01/17/17 01/24/17  Frederich Cha, MD  ketoconazole (NIZORAL) 2 % cream Apply 1 application topically 2 (two) times daily. 01/17/17   Frederich Cha, MD  lisinopril (PRINIVIL,ZESTRIL) 20 MG tablet Take 1 tablet (20 mg total) by mouth daily. 10/16/15   Roselee Nova, MD  predniSONE (STERAPRED UNI-PAK 21 TAB) 10 MG (21) TBPK tablet Sig 6 tablet day 1, 5 tablets day 2, 4 tablets day 3,,3tablets day 4, 2 tablets day 5, 1 tablet day 6 take all tablets orally 12/29/16   Frederich Cha, MD  predniSONE (STERAPRED UNI-PAK 21 TAB) 10 MG (21) TBPK tablet Sig 6 tablet day 1, 5  tablets day 2, 4 tablets day 3,,3tablets day 4, 2 tablets day 5, 1 tablet day 6 take all tablets orally 01/04/17   Frederich Cha, MD  terconazole (TERAZOL 3) 80 MG vaginal suppository Place 1 suppository (80 mg total) vaginally at bedtime. 01/04/17   Frederich Cha, MD  tiZANidine (ZANAFLEX) 4 MG capsule Take 4 mg by mouth 3 (three) times daily.    Historical Provider, MD  zolpidem (AMBIEN CR) 12.5 MG CR tablet Take 1 tablet (12.5 mg total) by mouth at bedtime as needed for sleep. 01/29/17   Frederich Cha, MD  zolpidem (AMBIEN) 5 MG tablet Take 1 tablet (5 mg total) by mouth at bedtime as needed for sleep. 05/31/16   Arnetha Courser, MD    Family History Family History  Problem Relation Age of Onset  . Diabetes  Mother   . Heart disease Mother   . Kidney disease Mother     dialysis x 7 years  . Stroke Mother     tiny spot just found incidentally on scan  . Cancer Father     multiple myeloma    Social History Social History  Substance Use Topics  . Smoking status: Never Smoker  . Smokeless tobacco: Never Used  . Alcohol use No     Allergies   Quinolones and Moxifloxacin   Review of Systems Review of Systems  HENT: Positive for congestion.   Respiratory: Positive for cough and shortness of breath.   Genitourinary: Positive for vaginal discharge and vaginal pain.  All other systems reviewed and are negative.    Physical Exam Triage Vital Signs ED Triage Vitals  Enc Vitals Group     BP 01/17/17 0829 (!) 142/83     Pulse Rate 01/17/17 0829 (!) 107     Resp 01/17/17 0829 18     Temp 01/17/17 0829 98.6 F (37 C)     Temp Source 01/17/17 0829 Oral     SpO2 01/17/17 0829 99 %     Weight 01/17/17 0826 208 lb (94.3 kg)     Height 01/17/17 0826 '5\' 6"'  (1.676 m)     Head Circumference --      Peak Flow --      Pain Score 01/17/17 0829 8     Pain Loc --      Pain Edu? --      Excl. in [place]? --    No data found.   Updated Vital Signs BP (!) 142/83 (BP Location: Left Arm)   Pulse  (!) 107   Temp 98.6 F (37 C) (Oral)   Resp 18   Ht '5\' 6"'  (1.676 m)   Wt 208 lb (94.3 kg)   SpO2 99%   BMI 33.57 kg/m   Visual Acuity Right Eye Distance:   Left Eye Distance:   Bilateral Distance:    Right Eye Near:   Left Eye Near:    Bilateral Near:     Physical Exam  Constitutional: She appears well-developed and well-nourished.  HENT:  Head: Normocephalic and atraumatic.  Right Ear: External ear normal.  Left Ear: External ear normal.  Mouth/Throat: Oropharynx is clear and moist.  Eyes: Pupils are equal, round, and reactive to light.  Neck: Normal range of motion. Neck supple.  Cardiovascular: Normal rate, regular rhythm and normal heart sounds.   Pulmonary/Chest: Effort normal. No respiratory distress. She has wheezes.  Genitourinary: Rectum normal. There is rash on the right labia. There is rash on the left labia. Uterus is not enlarged. Cervix exhibits no discharge. There is erythema and tenderness in the vagina. Vaginal discharge found.  Genitourinary Comments: Patient vaginal area appears to be raw heavy discharge present. Marked tenderness to palpation as well  Lymphadenopathy:    She has no cervical adenopathy.  Vitals reviewed.    UC Treatments / Results  Labs (all labs ordered are listed, but only abnormal results are displayed) Labs Reviewed  WET PREP, GENITAL - Abnormal; Notable for the following:       Result Value   WBC, Wet Prep HPF POC TOO NUMEROUS TO COUNT (*)    All other components within normal limits  CHLAMYDIA/NGC RT PCR [facility] ONLY)    EKG  EKG Interpretation None       Radiology Dg Chest 2 View  Result Date: 01/17/2017 CLINICAL DATA:  Three weeks  of productive cough, shortness of breath, and wheezing. History of peripheral vascular disease, nonsmoker. EXAM: CHEST  2 VIEW COMPARISON:  Chest x-ray of January 04, 2017 FINDINGS: The lungs are well-expanded and clear. The heart and pulmonary vascularity are normal. The  mediastinum is normal in width. There is no pleural effusion. There is mild multilevel degenerative disc disease of the thoracic spine. IMPRESSION: There is no pneumonia nor other acute cardiopulmonary abnormality. Electronically Signed   By: David  Martinique M.D.   On: 01/17/2017 09:57    Procedures Procedures (including critical care time)  Medications Ordered in UC Medications  ipratropium-albuterol (DUONEB) 0.5-2.5 (3) MG/3ML nebulizer solution 3 mL (3 mLs Nebulization Given 01/17/17 0936)     Initial Impression / Assessment and Plan / UC Course  I have reviewed the triage vital signs and the nursing notes.  Pertinent labs & imaging results that were available during my care of the patient were reviewed by me and considered in my medical decision making (see chart for details).   patient given DuoNeb treatment breathing improved. Chest x-ray was negative. Wet prep showed velocity of the sees but no pathogen seen. In case she's not been treated completely for deceased we'll give her Diflucan 200 mg but given for 10 days versus 7 days since she's has some much trouble with yeast vaginitis. I'm also going to place her on Nizoral cream to use on the outside of the introitus because of mild discomfort she is having also she's menopausal symptoms this could be post menopausal vaginal atrophy. Allow to use Estrace cream daily for about 1-2 weeks to see if it makes a difference as well. Lastly because she's had such a reaction to antibiotics causing yeast I gave her prescription for Ceftin for 10 days but instructed not to take this for least 2 days to see if the cough and congestion symptoms. Renew Tussionex prescription and her albuterol inhaler. She also has a sleep disorder and I'm going to give her a new prescription for Ambien since she's moving out of this down to [place] to live and has to establish a new PCP.    Final Clinical Impressions(s) / UC Diagnoses   Final diagnoses:  Cough  Acute  bronchitis, unspecified organism  Acute vaginitis    New Prescriptions New Prescriptions   ALBUTEROL (PROVENTIL HFA;VENTOLIN HFA) 108 (90 BASE) MCG/ACT INHALER    Inhale 2 puffs into the lungs every 4 (four) hours as needed for wheezing or shortness of breath.   CEFUROXIME (CEFTIN) 500 MG TABLET    Take 1 tablet (500 mg total) by mouth 2 (two) times daily. His cough becomes worse and congestion doesn't improve   CHLORPHENIRAMINE-HYDROCODONE (TUSSIONEX PENNKINETIC ER) 10-8 MG/5ML SUER    Take 5 mLs by mouth every 12 (twelve) hours as needed for cough.   FLUCONAZOLE (DIFLUCAN) 200 MG TABLET    Take 1 tablet (200 mg total) by mouth daily.   KETOCONAZOLE (NIZORAL) 2 % CREAM    Apply 1 application topically 2 (two) times daily.    Note: This dictation was prepared with Dragon dictation along with smaller phrase technology. Any transcriptional errors that result from this process are unintentional.   Frederich Cha, MD 01/17/17 1125

## 2018-04-22 ENCOUNTER — Observation Stay
Admission: EM | Admit: 2018-04-22 | Discharge: 2018-04-26 | Disposition: A | Discharge status: Home / Self Care | Payer: Medicare Other | Attending: Internal Medicine | Admitting: Internal Medicine

## 2018-04-22 ENCOUNTER — Emergency Department: Payer: Medicare Other

## 2018-04-22 ENCOUNTER — Encounter (HOSPITAL_COMMUNITY): Payer: Self-pay | Admitting: *Deleted

## 2018-04-22 ENCOUNTER — Other Ambulatory Visit: Payer: Self-pay

## 2018-04-22 DIAGNOSIS — F410 Panic disorder [episodic paroxysmal anxiety] without agoraphobia: Secondary | ICD-10-CM | POA: Diagnosis not present

## 2018-04-22 DIAGNOSIS — R197 Diarrhea, unspecified: Secondary | ICD-10-CM

## 2018-04-22 DIAGNOSIS — M797 Fibromyalgia: Secondary | ICD-10-CM | POA: Diagnosis not present

## 2018-04-22 DIAGNOSIS — Z888 Allergy status to other drugs, medicaments and biological substances status: Secondary | ICD-10-CM | POA: Diagnosis not present

## 2018-04-22 DIAGNOSIS — Z79899 Other long term (current) drug therapy: Secondary | ICD-10-CM | POA: Diagnosis not present

## 2018-04-22 DIAGNOSIS — B349 Viral infection, unspecified: Secondary | ICD-10-CM | POA: Diagnosis not present

## 2018-04-22 DIAGNOSIS — E860 Dehydration: Secondary | ICD-10-CM | POA: Diagnosis not present

## 2018-04-22 DIAGNOSIS — Z8673 Personal history of transient ischemic attack (TIA), and cerebral infarction without residual deficits: Secondary | ICD-10-CM | POA: Diagnosis not present

## 2018-04-22 DIAGNOSIS — Z7982 Long term (current) use of aspirin: Secondary | ICD-10-CM | POA: Insufficient documentation

## 2018-04-22 DIAGNOSIS — K529 Noninfective gastroenteritis and colitis, unspecified: Secondary | ICD-10-CM | POA: Insufficient documentation

## 2018-04-22 DIAGNOSIS — R059 Cough, unspecified: Secondary | ICD-10-CM

## 2018-04-22 DIAGNOSIS — F5104 Psychophysiologic insomnia: Secondary | ICD-10-CM

## 2018-04-22 DIAGNOSIS — R0602 Shortness of breath: Secondary | ICD-10-CM | POA: Insufficient documentation

## 2018-04-22 DIAGNOSIS — R1013 Epigastric pain: Secondary | ICD-10-CM | POA: Diagnosis present

## 2018-04-22 DIAGNOSIS — Z881 Allergy status to other antibiotic agents status: Secondary | ICD-10-CM | POA: Insufficient documentation

## 2018-04-22 DIAGNOSIS — N179 Acute kidney failure, unspecified: Principal | ICD-10-CM | POA: Diagnosis present

## 2018-04-22 DIAGNOSIS — R05 Cough: Secondary | ICD-10-CM | POA: Insufficient documentation

## 2018-04-22 DIAGNOSIS — R112 Nausea with vomiting, unspecified: Secondary | ICD-10-CM

## 2018-04-22 DIAGNOSIS — I10 Essential (primary) hypertension: Secondary | ICD-10-CM | POA: Insufficient documentation

## 2018-04-22 LAB — CBC
HCT: 35.4 % (ref 35.0–47.0)
Hemoglobin: 11.8 g/dL — ABNORMAL LOW (ref 12.0–16.0)
MCH: 27.1 pg (ref 26.0–34.0)
MCHC: 33.3 g/dL (ref 32.0–36.0)
MCV: 81.4 fL (ref 80.0–100.0)
PLATELETS: 364 10*3/uL (ref 150–440)
RBC: 4.35 MIL/uL (ref 3.80–5.20)
RDW: 16.1 % — ABNORMAL HIGH (ref 11.5–14.5)
WBC: 13.2 10*3/uL — AB (ref 3.6–11.0)

## 2018-04-22 LAB — BASIC METABOLIC PANEL
Anion gap: 11 (ref 5–15)
BUN: 45 mg/dL — ABNORMAL HIGH (ref 6–20)
CO2: 24 mmol/L (ref 22–32)
CREATININE: 2.33 mg/dL — AB (ref 0.44–1.00)
Calcium: 11.6 mg/dL — ABNORMAL HIGH (ref 8.9–10.3)
Chloride: 100 mmol/L — ABNORMAL LOW (ref 101–111)
GFR calc Af Amer: 27 mL/min — ABNORMAL LOW (ref >60)
GFR, EST NON AFRICAN AMERICAN: 23 mL/min — AB (ref >60)
GLUCOSE: 113 mg/dL — AB (ref 65–99)
Potassium: 3.7 mmol/L (ref 3.5–5.1)
SODIUM: 135 mmol/L (ref 135–145)

## 2018-04-22 MED ORDER — DIPHENHYDRAMINE HCL 50 MG/ML IJ SOLN
12.5000 mg | Freq: Four times a day (QID) | INTRAMUSCULAR | Status: DC | PRN
  Administered 2018-04-22 – 2018-04-24 (×4): 12.5 mg via INTRAVENOUS
  Filled 2018-04-22 (×5): qty 0.25

## 2018-04-22 MED ORDER — HYDROCOD POLST-CPM POLST ER 10-8 MG/5ML PO SUER
5.0000 mL | Freq: Two times a day (BID) | ORAL | Status: DC | PRN
  Administered 2018-04-23 – 2018-04-24 (×3): 5 mL via ORAL
  Filled 2018-04-22 (×3): qty 5

## 2018-04-22 MED ORDER — OXYCODONE-ACETAMINOPHEN 5-325 MG PO TABS
1.0000 | ORAL_TABLET | Freq: Four times a day (QID) | ORAL | Status: DC | PRN
  Administered 2018-04-22 – 2018-04-24 (×6): 1 via ORAL
  Filled 2018-04-22 (×6): qty 1

## 2018-04-22 MED ORDER — ACETAMINOPHEN 650 MG RE SUPP: 650.0000 mg | Freq: Four times a day (QID) | RECTAL | Status: DC | PRN

## 2018-04-22 MED ORDER — ZOLPIDEM TARTRATE 5 MG PO TABS
10.0000 mg | ORAL_TABLET | Freq: Every evening | ORAL | Status: DC | PRN
  Administered 2018-04-23 – 2018-04-25 (×3): 10 mg via ORAL
  Filled 2018-04-22 (×3): qty 2

## 2018-04-22 MED ORDER — ONDANSETRON HCL 4 MG/2ML IJ SOLN
4.0000 mg | Freq: Once | INTRAMUSCULAR | Status: AC
Start: 2018-04-22 — End: 2018-04-22
  Administered 2018-04-22: 4 mg via INTRAVENOUS
  Filled 2018-04-22: qty 2

## 2018-04-22 MED ORDER — ZOLPIDEM TARTRATE 5 MG PO TABS: 5.0000 mg | ORAL_TABLET | Freq: Every evening | ORAL | Status: DC | PRN

## 2018-04-22 MED ORDER — SODIUM CHLORIDE 0.9 % IV BOLUS
1000.0000 mL | Freq: Once | INTRAVENOUS | Status: AC
  Administered 2018-04-22: 1000 mL via INTRAVENOUS

## 2018-04-22 MED ORDER — ACETAMINOPHEN 325 MG PO TABS: 650.0000 mg | ORAL_TABLET | Freq: Four times a day (QID) | ORAL | Status: DC | PRN

## 2018-04-22 MED ORDER — ASPIRIN EC 81 MG PO TBEC
81.0000 mg | DELAYED_RELEASE_TABLET | Freq: Every day | ORAL | Status: DC
  Administered 2018-04-22 – 2018-04-26 (×5): 81 mg via ORAL
  Filled 2018-04-22 (×5): qty 1

## 2018-04-22 MED ORDER — IPRATROPIUM-ALBUTEROL 0.5-2.5 (3) MG/3ML IN SOLN
3.0000 mL | Freq: Once | RESPIRATORY_TRACT | Status: AC
  Administered 2018-04-22: 3 mL via RESPIRATORY_TRACT
  Filled 2018-04-22: qty 3

## 2018-04-22 MED ORDER — PROMETHAZINE HCL 25 MG PO TABS
12.5000 mg | ORAL_TABLET | Freq: Four times a day (QID) | ORAL | Status: DC | PRN
  Administered 2018-04-23: 08:00:00 12.5 mg via ORAL
  Filled 2018-04-22 (×2): qty 1

## 2018-04-22 MED ORDER — ESCITALOPRAM OXALATE 10 MG PO TABS
10.0000 mg | ORAL_TABLET | Freq: Every day | ORAL | Status: DC
  Administered 2018-04-23 – 2018-04-26 (×4): 10 mg via ORAL
  Filled 2018-04-22 (×5): qty 1

## 2018-04-22 MED ORDER — ALBUTEROL SULFATE (2.5 MG/3ML) 0.083% IN NEBU
3.0000 mL | INHALATION_SOLUTION | Freq: Four times a day (QID) | RESPIRATORY_TRACT | Status: DC | PRN
Start: 2018-04-22 — End: 2018-04-26
  Administered 2018-04-23 – 2018-04-25 (×5): 3 mL via RESPIRATORY_TRACT
  Filled 2018-04-22 (×5): qty 3

## 2018-04-22 MED ORDER — METHYLPREDNISOLONE SODIUM SUCC 40 MG IJ SOLR
40.0000 mg | Freq: Once | INTRAMUSCULAR | Status: AC
  Administered 2018-04-22: 40 mg via INTRAVENOUS
  Filled 2018-04-22: qty 1

## 2018-04-22 MED ORDER — CLONAZEPAM 0.5 MG PO TABS
1.0000 mg | ORAL_TABLET | Freq: Two times a day (BID) | ORAL | Status: DC
  Administered 2018-04-22 – 2018-04-23 (×2): 1 mg via ORAL
  Filled 2018-04-22 (×2): qty 2

## 2018-04-22 MED ORDER — DIPHENHYDRAMINE HCL 50 MG/ML IJ SOLN
25.0000 mg | Freq: Once | INTRAMUSCULAR | Status: AC
  Administered 2018-04-22: 25 mg via INTRAVENOUS

## 2018-04-22 MED ORDER — ENOXAPARIN SODIUM 40 MG/0.4ML ~~LOC~~ SOLN
40.0000 mg | SUBCUTANEOUS | Status: DC
  Filled 2018-04-22: qty 0.4

## 2018-04-22 MED ORDER — DIPHENHYDRAMINE HCL 50 MG/ML IJ SOLN
INTRAMUSCULAR | Status: AC
  Administered 2018-04-22: 25 mg via INTRAVENOUS
  Filled 2018-04-22: qty 1

## 2018-04-22 MED ORDER — MORPHINE SULFATE (PF) 2 MG/ML IV SOLN: 2.0000 mg | INTRAVENOUS | Status: DC | PRN

## 2018-04-22 MED ORDER — SODIUM CHLORIDE 0.9 % IV SOLN
INTRAVENOUS | Status: DC
  Administered 2018-04-22 – 2018-04-25 (×6): via INTRAVENOUS

## 2018-04-22 NOTE — Plan of Care (Signed)

## 2018-04-22 NOTE — Progress Notes (Signed)
Called by nursing that patient requests Ambien order be changed from 5 mg to 10 mg tablet QHS.  Per nursing patient has prescription bottle with 10 mg QHS dosing written.  Order changed.  Kristeen [facility] 04/22/2018, 8:57 PM

## 2018-04-22 NOTE — ED Notes (Signed)
This RN called to bedside due to patient asking about 2nd liter of fluids. This RN explained EDP had not placed order for 2nd liter of fluids. Pt visualized in NAD. Will continue to monitor for further patient needs.

## 2018-04-22 NOTE — H&P (Addendum)
[place] [place] NAME: Victoria Guzman    MR#:  [id number]  DATE OF BIRTH:  [birth year]  DATE OF ADMISSION:  04/22/2018  PRIMARY CARE PHYSICIAN: Patient, No Pcp Per   REQUESTING/REFERRING PHYSICIAN: Rudene Re, MD  CHIEF COMPLAINT:   Chief Complaint  Patient presents with  . Abdominal Pain  . Shortness of Breath    HISTORY OF PRESENT ILLNESS: Victoria Guzman  is a 52 y.o. female with a known history of anxiety, chronic fatigue, fibromyalgia presenting to the emergency room with complaint of nausea vomiting going on for a few days.  Patient also complains of epigastric pain sharp in nature.  She had a CT per renal protocol which showed some enteritis.  Patient also has history of recurrent bronchitis and complaining of some cough productive of whitish sputum.  She was very nauseous in the ED and after she got Zofran she started complaining of itching all over and then felt her lip was getting swollen.  Therefore had to receive Benadryl. She was having diarrhea which is resolved since yesterday.    PAST MEDICAL HISTORY:   Past Medical History:  Diagnosis Date  . Anxiety   . Chronic fatigue   . Chronic insomnia 05/31/2016  . Fibromyalgia   . Insomnia   . Panic attack   . Stroke ([place])   . Vitamin D deficiency 05/31/2016    PAST SURGICAL HISTORY:  Past Surgical History:  Procedure Laterality Date  . PARTIAL HYSTERECTOMY      SOCIAL HISTORY:  Social History   Tobacco Use  . Smoking status: Never Smoker  . Smokeless tobacco: Never Used  Substance Use Topics  . Alcohol use: No    FAMILY HISTORY:  Family History  Problem Relation Age of Onset  . Diabetes Mother   . Heart disease Mother   . Kidney disease Mother        dialysis x 7 years  . Stroke Mother        tiny spot just found incidentally on scan  . Cancer Father        multiple myeloma    DRUG ALLERGIES:  Allergies  Allergen Reactions  . Quinolones      Other reaction(s): Unknown  . Moxifloxacin Rash    REVIEW OF SYSTEMS:   CONSTITUTIONAL: No fever, fatigue or weakness.  EYES: No blurred or double vision.  EARS, NOSE, AND THROAT: No tinnitus or ear pain.  RESPIRATORY: No cough, shortness of breath, wheezing or hemoptysis.  CARDIOVASCULAR: No chest pain, orthopnea, edema.  GASTROINTESTINAL: Positive nausea, positive vomiting, positive diarrhea or abdominal pain.  GENITOURINARY: No dysuria, hematuria.  ENDOCRINE: No polyuria, nocturia,  HEMATOLOGY: No anemia, easy bruising or bleeding SKIN: No rash or lesion. MUSCULOSKELETAL: No joint pain or arthritis.   NEUROLOGIC: No tingling, numbness, weakness.  PSYCHIATRY: No anxiety or depression.   MEDICATIONS AT HOME:  Prior to Admission medications   Medication Sig Start Date End Date Taking? Authorizing Provider  aspirin ([facility] ASPIRIN LOW DOSE) 81 MG EC tablet Take 1 tablet (81 mg total) by mouth daily. 07/07/15  Yes Ashok Norris, MD  clonazePAM (KLONOPIN) 1 MG tablet Take 1 mg by mouth 2 (two) times daily.   Yes [provider]  escitalopram (LEXAPRO) 10 MG tablet Take 1 tablet by mouth daily. 01/29/18  Yes [provider]  albuterol (PROAIR HFA) 108 (90 BASE) MCG/ACT inhaler Inhale 2 puffs into the lungs as needed. 08/11/15   Ashok Norris, MD  albuterol (PROVENTIL HFA;VENTOLIN HFA) 108 (90 Base) MCG/ACT inhaler Inhale 2 puffs into the lungs every 4 (four) hours as needed for wheezing or shortness of breath. Patient not taking: Reported on 04/22/2018 01/17/17   Frederich Cha, MD  azithromycin [facility]) 500 MG tablet 1 tablet daily Patient not taking: Reported on 04/22/2018 01/04/17   Frederich Cha, MD  cefUROXime (CEFTIN) 500 MG tablet Take 1 tablet (500 mg total) by mouth 2 (two) times daily. His cough becomes worse and congestion doesn't improve Patient not taking: Reported on 04/22/2018 01/17/17   Frederich Cha, MD  chlorpheniramine-HYDROcodone [facility] PENNKINETIC ER)  10-8 MG/5ML SUER Take 5 mLs by mouth every 12 (twelve) hours as needed. Patient not taking: Reported on 04/22/2018 12/29/16   Frederich Cha, MD  chlorpheniramine-HYDROcodone [facility] ER) 10-8 MG/5ML SUER Take 5 mLs by mouth every 12 (twelve) hours as needed for cough. Patient not taking: Reported on 04/22/2018 01/17/17   Frederich Cha, MD  clonazePAM (KLONOPIN) 1 MG tablet Take 1 tablet (1 mg total) by mouth 2 (two) times daily. Patient not taking: Reported on 04/22/2018 12/29/16   Frederich Cha, MD  estradiol (ESTRACE VAGINAL) 0.1 MG/GM vaginal cream Place 1 Applicatorful vaginally at bedtime. Patient not taking: Reported on 04/22/2018 01/17/17   Frederich Cha, MD  ketoconazole (NIZORAL) 2 % cream Apply 1 application topically 2 (two) times daily. Patient not taking: Reported on 04/22/2018 01/17/17   Frederich Cha, MD  lisinopril (PRINIVIL,ZESTRIL) 20 MG tablet Take 1 tablet (20 mg total) by mouth daily. Patient not taking: Reported on 04/22/2018 10/16/15   Roselee Nova, MD  lisinopril (PRINIVIL,ZESTRIL) 20 MG tablet Take 1 tablet (20 mg total) by mouth daily. Patient not taking: Reported on 04/22/2018 12/29/16   Frederich Cha, MD  predniSONE (STERAPRED UNI-PAK 21 TAB) 10 MG (21) TBPK tablet Sig 6 tablet day 1, 5 tablets day 2, 4 tablets day 3,,3tablets day 4, 2 tablets day 5, 1 tablet day 6 take all tablets orally Patient not taking: Reported on 04/22/2018 12/29/16   Frederich Cha, MD  predniSONE (STERAPRED UNI-PAK 21 TAB) 10 MG (21) TBPK tablet Sig 6 tablet day 1, 5 tablets day 2, 4 tablets day 3,,3tablets day 4, 2 tablets day 5, 1 tablet day 6 take all tablets orally Patient not taking: Reported on 04/22/2018 01/04/17   Frederich Cha, MD  terconazole (TERAZOL 3) 80 MG vaginal suppository Place 1 suppository (80 mg total) vaginally at bedtime. Patient not taking: Reported on 04/22/2018 01/04/17   Frederich Cha, MD  tiZANidine (ZANAFLEX) 4 MG capsule Take 1 capsule (4 mg total) by mouth 3 (three) times  daily. Patient not taking: Reported on 04/22/2018 12/29/16   Frederich Cha, MD  zolpidem (AMBIEN CR) 12.5 MG CR tablet Take 1 tablet (12.5 mg total) by mouth at bedtime as needed for sleep. Patient not taking: Reported on 04/22/2018 01/29/17   Frederich Cha, MD  zolpidem (AMBIEN) 5 MG tablet Take 1 tablet (5 mg total) by mouth at bedtime as needed for sleep. Patient not taking: Reported on 04/22/2018 05/31/16   Arnetha Courser, MD      PHYSICAL EXAMINATION:   VITAL SIGNS: Blood pressure (!) 144/80, pulse (!) 102, temperature 98.9 F (37.2 C), temperature source Oral, resp. rate 20, height _0  (1.676 m), weight 88.5 kg (195 lb), SpO2 98 %.  GENERAL:  52 y.o.-year-old patient lying in the bed with no acute distress.  EYES: Pupils equal, round, reactive to light and accommodation. No scleral icterus. Extraocular muscles intact.  HEENT: Head atraumatic, normocephalic. Oropharynx and nasopharynx clear.  NECK:  Supple, no jugular venous distention. No thyroid enlargement, no tenderness.  LUNGS: Normal breath sounds bilaterally, no wheezing, rales,rhonchi or crepitation. No use of accessory muscles of respiration.  CARDIOVASCULAR: S1, S2 normal. No murmurs, rubs, or gallops.  ABDOMEN: Soft, mild epigastric tenderness no guarding no rebound EXTREMITIES: No pedal edema, cyanosis, or clubbing.  NEUROLOGIC: Cranial nerves II through XII are intact. Muscle strength 5/5 in all extremities. Sensation intact. Gait not checked.  PSYCHIATRIC: The patient is alert and oriented x 3.  SKIN: No obvious rash, lesion, or ulcer.   LABORATORY PANEL:   CBC Recent Labs  Lab 04/22/18 1416  WBC 13.2*  HGB 11.8*  HCT 35.4  PLT 364  MCV 81.4  MCH 27.1  MCHC 33.3  RDW 16.1*   ------------------------------------------------------------------------------------------------------------------  Chemistries  Recent Labs  Lab 04/22/18 1416  NA 135  K 3.7  CL 100*  CO2 24  GLUCOSE 113*  BUN 45*  CREATININE  2.33*  CALCIUM 11.6*   ------------------------------------------------------------------------------------------------------------------ estimated creatinine clearance is 32.4 mL/min (A) (by C-G formula based on SCr of 2.33 mg/dL (H)). ------------------------------------------------------------------------------------------------------------------ No results for input(s): TSH, T4TOTAL, T3FREE, THYROIDAB in the last 72 hours.  Invalid input(s): FREET3   Coagulation profile No results for input(s): INR, PROTIME in the last 168 hours. ------------------------------------------------------------------------------------------------------------------- No results for input(s): DDIMER in the last 72 hours. -------------------------------------------------------------------------------------------------------------------  Cardiac Enzymes No results for input(s): CKMB, TROPONINI, MYOGLOBIN in the last 168 hours.  Invalid input(s): CK ------------------------------------------------------------------------------------------------------------------ Invalid input(s): POCBNP  ---------------------------------------------------------------------------------------------------------------  Urinalysis    Component Value Date/Time   COLORURINE Amber 02/23/2012 1511   APPEARANCEUR Hazy 02/23/2012 1511   LABSPEC 1.032 02/23/2012 1511   PHURINE 5.0 02/23/2012 1511   GLUCOSEU Negative 02/23/2012 1511   HGBUR Negative 02/23/2012 1511   BILIRUBINUR Negative 02/23/2012 1511   KETONESUR 2+ 02/23/2012 1511   PROTEINUR 100 mg/dL 02/23/2012 1511   NITRITE Negative 02/23/2012 1511   LEUKOCYTESUR Negative 02/23/2012 1511     RADIOLOGY: Dg Chest 2 View  Result Date: 04/22/2018 CLINICAL DATA:  Abdominal pain.  Shortness of breath. EXAM: CHEST - 2 VIEW COMPARISON:  Chest radiograph 01/17/2017 FINDINGS: Normal cardiac and mediastinal contours. No consolidative pulmonary opacities. No pleural effusion or  pneumothorax. Thoracic spine degenerative changes. IMPRESSION: No acute cardiopulmonary process. Electronically Signed   By: Lovey Newcomer M.D.   On: 04/22/2018 15:18   Ct Renal Stone Study  Result Date: 04/22/2018 CLINICAL DATA:  Shortness of breath, abdominal pain, nausea and vomiting for 3 days. History of hysterectomy. EXAM: CT ABDOMEN AND PELVIS WITHOUT CONTRAST TECHNIQUE: Multidetector CT imaging of the abdomen and pelvis was performed following the standard protocol without IV contrast. COMPARISON:  CT abdomen and pelvis February 23, 2012. FINDINGS: LOWER CHEST: Lung bases are clear. The visualized heart size is normal. No pericardial effusion. Mild gas distended distal esophagus associated with reflux. HEPATOBILIARY: Subcentimeter gallstone without CT findings of acute cholecystitis. Normal noncontrast CT liver. PANCREAS: Normal. SPLEEN: Normal. ADRENALS/URINARY TRACT: Kidneys are orthotopic, demonstrating normal size and morphology. No nephrolithiasis, hydronephrosis; limited assessment for renal masses on this nonenhanced examination. The unopacified ureters are normal in course and caliber. Urinary bladder is partially distended and unremarkable. Normal adrenal glands. STOMACH/BOWEL: Multiple loops of mid and RIGHT lower quadrant small bowel wall thickening and surrounding mesenteric edema. Small bowel is normal in overall caliber. Mild colonic diverticulosis. The stomach, large bowel are normal in course and caliber without inflammatory changes, sensitivity decreased by  lack of enteric contrast. Normal appendix. VASCULAR/LYMPHATIC: Aortoiliac vessels are normal in course and caliber. No lymphadenopathy by CT size criteria. REPRODUCTIVE: Status post hysterectomy. OTHER: Small volume perihepatic and pelvic free fluid. No intraperitoneal free air. MUSCULOSKELETAL: Non-acute. Similar mild LEFT sacroiliac periarticular sclerosis. Severe L4-5 facet arthropathy. Subcentimeter sclerotic Schmorl's node T11,  similar. Tiny fat containing umbilical hernia. IMPRESSION: 1. Small bowel enteritis. No bowel obstruction. Small volume ascites. Electronically Signed   By: Elon Alas M.D.   On: 04/22/2018 16:17    EKG: Orders placed or performed in visit on 07/17/14  . EKG 12-Lead    IMPRESSION AND PLAN: Patient is 52 year old presenting with nausea vomiting and diarrhea  1.  Acute renal failure due to enteritis, will give her IV fluids monitor renal function  2.  Nausea vomiting diarrhea, diarrhea is resolved this is due to enteritis if she has recurrence of diarrhea which can send stool studies for testing.  3.  Cough antitussive medication supportive care I will hold off on antibiotics for now  4.  Anxiety disorder continue clonazepam  5.  Leukocytosis due to #2 follow WBC count  6.  Allergic reaction to Zofran in the ED will use Phenergan I will give her a dose of steroid x1 and continue Benadryl as needed  7.hypercalcemia likely due to dehydration we will repeat calcium levels in the morning  All the records are reviewed and case discussed with ED provider. Management plans discussed with the patient, family and they are in agreement.  CODE STATUS: Full    TOTAL TIME TAKING CARE OF THIS PATIENT: 55 minutes.    Dustin Flock M.D on 04/22/2018 at 5:07 PM  Between 7am to 6pm - Pager - [phone]  After 6pm go to [email or web address] - password [email or web address] [facility]  [employer] [facility]  [phone]  CC: Primary care physician; Patient, No Pcp Per

## 2018-04-22 NOTE — ED Triage Notes (Signed)
Pt presents via POV c/o SOB, abd pain, and N/V x3 days. Reports productive cough. Sister reports pt is "unable to keep anything down".

## 2018-04-22 NOTE — ED Provider Notes (Signed)
[facility] Emergency Department Provider Note  ____________________________________________  Time seen: Approximately 3:27 PM  I have reviewed the triage vital signs and the nursing notes.   HISTORY  Chief Complaint Abdominal Pain and Shortness of Breath   HPI Victoria Guzman is a 52 y.o. female with a history of chronic fatigue, fibromyalgia, panic attacks, anxiety who presents for evaluation of nausea, vomiting, diarrhea, cough.  Patient reports that her symptoms started 3 days ago.  She has a cough productive of clear sputum, mild shortness of breath associated with it.  She does have a history of bronchitis which is attributed to allergies.  She does not smoke.  She endorses nausea several daily episodes of nonbloody nonbilious emesis and diarrhea.  She reports that she has been unable to keep anything down.  She is complaining of abdominal pain which is diffuse and sharp.  She reports that the pain has been constant and moderate in intensity.  She denies chest pain, dysuria or hematuria, fever or chills.  Past Medical History:  Diagnosis Date  . Anxiety   . Chronic fatigue   . Chronic insomnia 05/31/2016  . Fibromyalgia   . Insomnia   . Panic attack   . Stroke ([place])   . Vitamin D deficiency 05/31/2016    Patient Active Problem List   Diagnosis Date Noted  . Hypercalcemia 05/31/2016  . Hypokalemia 05/31/2016  . Hypochloremia 05/31/2016  . Screening for HIV (human immunodeficiency virus) 05/31/2016  . Screen for STD (sexually transmitted disease) 05/31/2016  . Vitamin D deficiency 05/31/2016  . Chronic insomnia 05/31/2016  . Absolute anemia 07/07/2015  . Clinical depression 07/07/2015  . Essential hypertension 07/07/2015  . Acute anxiety 07/07/2015  . Primary fibromyalgia syndrome 07/07/2015  . Embolic stroke ([place]) 48/88/9169    Past Surgical History:  Procedure Laterality Date  . PARTIAL HYSTERECTOMY      Prior to Admission  medications   Medication Sig Start Date End Date Taking? Authorizing Provider  aspirin ([facility] ASPIRIN LOW DOSE) 81 MG EC tablet Take 1 tablet (81 mg total) by mouth daily. 07/07/15  Yes Ashok Norris, MD  clonazePAM (KLONOPIN) 1 MG tablet Take 1 mg by mouth 2 (two) times daily.   Yes [provider]  escitalopram (LEXAPRO) 10 MG tablet Take 1 tablet by mouth daily. 01/29/18  Yes [provider]  albuterol (PROAIR HFA) 108 (90 BASE) MCG/ACT inhaler Inhale 2 puffs into the lungs as needed. 08/11/15   Ashok Norris, MD  albuterol (PROVENTIL HFA;VENTOLIN HFA) 108 (90 Base) MCG/ACT inhaler Inhale 2 puffs into the lungs every 4 (four) hours as needed for wheezing or shortness of breath. Patient not taking: Reported on 04/22/2018 01/17/17   Frederich Cha, MD  azithromycin [facility]) 500 MG tablet 1 tablet daily Patient not taking: Reported on 04/22/2018 01/04/17   Frederich Cha, MD  cefUROXime (CEFTIN) 500 MG tablet Take 1 tablet (500 mg total) by mouth 2 (two) times daily. His cough becomes worse and congestion doesn't improve Patient not taking: Reported on 04/22/2018 01/17/17   Frederich Cha, MD  chlorpheniramine-HYDROcodone [facility] PENNKINETIC ER) 10-8 MG/5ML SUER Take 5 mLs by mouth every 12 (twelve) hours as needed. Patient not taking: Reported on 04/22/2018 12/29/16   Frederich Cha, MD  chlorpheniramine-HYDROcodone [facility] ER) 10-8 MG/5ML SUER Take 5 mLs by mouth every 12 (twelve) hours as needed for cough. Patient not taking: Reported on 04/22/2018 01/17/17   Frederich Cha, MD  clonazePAM (KLONOPIN) 1 MG tablet Take 1 tablet (  1 mg total) by mouth 2 (two) times daily. Patient not taking: Reported on 04/22/2018 12/29/16   Frederich Cha, MD  estradiol (ESTRACE VAGINAL) 0.1 MG/GM vaginal cream Place 1 Applicatorful vaginally at bedtime. Patient not taking: Reported on 04/22/2018 01/17/17   Frederich Cha, MD  ketoconazole (NIZORAL) 2 % cream Apply 1 application topically 2 (two) times  daily. Patient not taking: Reported on 04/22/2018 01/17/17   Frederich Cha, MD  lisinopril (PRINIVIL,ZESTRIL) 20 MG tablet Take 1 tablet (20 mg total) by mouth daily. Patient not taking: Reported on 04/22/2018 10/16/15   Roselee Nova, MD  lisinopril (PRINIVIL,ZESTRIL) 20 MG tablet Take 1 tablet (20 mg total) by mouth daily. Patient not taking: Reported on 04/22/2018 12/29/16   Frederich Cha, MD  predniSONE (STERAPRED UNI-PAK 21 TAB) 10 MG (21) TBPK tablet Sig 6 tablet day 1, 5 tablets day 2, 4 tablets day 3,,3tablets day 4, 2 tablets day 5, 1 tablet day 6 take all tablets orally Patient not taking: Reported on 04/22/2018 12/29/16   Frederich Cha, MD  predniSONE (STERAPRED UNI-PAK 21 TAB) 10 MG (21) TBPK tablet Sig 6 tablet day 1, 5 tablets day 2, 4 tablets day 3,,3tablets day 4, 2 tablets day 5, 1 tablet day 6 take all tablets orally Patient not taking: Reported on 04/22/2018 01/04/17   Frederich Cha, MD  terconazole (TERAZOL 3) 80 MG vaginal suppository Place 1 suppository (80 mg total) vaginally at bedtime. Patient not taking: Reported on 04/22/2018 01/04/17   Frederich Cha, MD  tiZANidine (ZANAFLEX) 4 MG capsule Take 1 capsule (4 mg total) by mouth 3 (three) times daily. Patient not taking: Reported on 04/22/2018 12/29/16   Frederich Cha, MD  zolpidem (AMBIEN CR) 12.5 MG CR tablet Take 1 tablet (12.5 mg total) by mouth at bedtime as needed for sleep. Patient not taking: Reported on 04/22/2018 01/29/17   Frederich Cha, MD  zolpidem (AMBIEN) 5 MG tablet Take 1 tablet (5 mg total) by mouth at bedtime as needed for sleep. Patient not taking: Reported on 04/22/2018 05/31/16   Arnetha Courser, MD    Allergies Quinolones and Moxifloxacin  Family History  Problem Relation Age of Onset  . Diabetes Mother   . Heart disease Mother   . Kidney disease Mother        dialysis x 7 years  . Stroke Mother        tiny spot just found incidentally on scan  . Cancer Father        multiple myeloma    Social  History Social History   Tobacco Use  . Smoking status: Never Smoker  . Smokeless tobacco: Never Used  Substance Use Topics  . Alcohol use: No  . Drug use: No    Review of Systems  Constitutional: Negative for fever. Eyes: Negative for visual changes. ENT: Negative for sore throat. Neck: No neck pain  Cardiovascular: Negative for chest pain. Respiratory: + shortness of breath and cough Gastrointestinal: + abdominal pain, vomiting and diarrhea. Genitourinary: Negative for dysuria. Musculoskeletal: Negative for back pain. Skin: Negative for rash. Neurological: Negative for headaches, weakness or numbness. Psych: No SI or HI  ____________________________________________   PHYSICAL EXAM:  VITAL SIGNS: ED Triage Vitals  Enc Vitals Group     BP 04/22/18 1415 (!) 144/80     Pulse Rate 04/22/18 1405 (!) 102     Resp 04/22/18 1405 20     Temp 04/22/18 1405 98.9 F (37.2 C)     Temp Source 04/22/18 1405  Oral     SpO2 04/22/18 1405 98 %     Weight 04/22/18 1406 210 lb (95.3 kg)     Height 04/22/18 1406 '5\' 6"'  (1.676 m)     Head Circumference --      Peak Flow --      Pain Score 04/22/18 1410 2     Pain Loc --      Pain Edu? --      Excl. in [place]? --     Constitutional: Alert and oriented, no distress.  HEENT:      Head: Normocephalic and atraumatic.         Eyes: Conjunctivae are normal. Sclera is non-icteric.       Mouth/Throat: Mucous membranes are moist.       Neck: Supple with no signs of meningismus. Cardiovascular: Tachycardic with regular rhythm. No murmurs, gallops, or rubs. 2+ symmetrical distal pulses are present in all extremities. No JVD. Respiratory: Normal respiratory effort. Lungs are clear to auscultation bilaterally with faint expiratory wheezes.  Gastrointestinal: Soft, diffuse tenderness to palpation, and non distended with positive bowel sounds. No rebound or guarding. Genitourinary: No CVA tenderness. Musculoskeletal: Nontender with normal range of  motion in all extremities. No edema, cyanosis, or erythema of extremities. Neurologic: Normal speech and language. Face is symmetric. Moving all extremities. No gross focal neurologic deficits are appreciated. Skin: Skin is warm, dry and intact. No rash noted. Psychiatric: Patient has very weird affect, talks and behaves like a child, every question I asked her she would look at her sister for the answer ____________________________________________   LABS (all labs ordered are listed, but only abnormal results are displayed)  Labs Reviewed  CBC - Abnormal; Notable for the following components:      Result Value   WBC 13.2 (*)    Hemoglobin 11.8 (*)    RDW 16.1 (*)    All other components within normal limits  BASIC METABOLIC PANEL - Abnormal; Notable for the following components:   Chloride 100 (*)    Glucose, Bld 113 (*)    BUN 45 (*)    Creatinine, Ser 2.33 (*)    Calcium 11.6 (*)    GFR calc non Af Amer 23 (*)    GFR calc Af Amer 27 (*)    All other components within normal limits  URINALYSIS, COMPLETE (UACMP) WITH MICROSCOPIC   ____________________________________________  EKG  none ____________________________________________  RADIOLOGY  I have personally reviewed the images performed during this visit and I agree with the Radiologist's read.   Interpretation by Radiologist:  Dg Chest 2 View  Result Date: 04/22/2018 CLINICAL DATA:  Abdominal pain.  Shortness of breath. EXAM: CHEST - 2 VIEW COMPARISON:  Chest radiograph 01/17/2017 FINDINGS: Normal cardiac and mediastinal contours. No consolidative pulmonary opacities. No pleural effusion or pneumothorax. Thoracic spine degenerative changes. IMPRESSION: No acute cardiopulmonary process. Electronically Signed   By: Lovey Newcomer M.D.   On: 04/22/2018 15:18   Ct Renal Stone Study  Result Date: 04/22/2018 CLINICAL DATA:  Shortness of breath, abdominal pain, nausea and vomiting for 3 days. History of hysterectomy. EXAM: CT  ABDOMEN AND PELVIS WITHOUT CONTRAST TECHNIQUE: Multidetector CT imaging of the abdomen and pelvis was performed following the standard protocol without IV contrast. COMPARISON:  CT abdomen and pelvis February 23, 2012. FINDINGS: LOWER CHEST: Lung bases are clear. The visualized heart size is normal. No pericardial effusion. Mild gas distended distal esophagus associated with reflux. HEPATOBILIARY: Subcentimeter gallstone without CT findings of acute cholecystitis.  Normal noncontrast CT liver. PANCREAS: Normal. SPLEEN: Normal. ADRENALS/URINARY TRACT: Kidneys are orthotopic, demonstrating normal size and morphology. No nephrolithiasis, hydronephrosis; limited assessment for renal masses on this nonenhanced examination. The unopacified ureters are normal in course and caliber. Urinary bladder is partially distended and unremarkable. Normal adrenal glands. STOMACH/BOWEL: Multiple loops of mid and RIGHT lower quadrant small bowel wall thickening and surrounding mesenteric edema. Small bowel is normal in overall caliber. Mild colonic diverticulosis. The stomach, large bowel are normal in course and caliber without inflammatory changes, sensitivity decreased by lack of enteric contrast. Normal appendix. VASCULAR/LYMPHATIC: Aortoiliac vessels are normal in course and caliber. No lymphadenopathy by CT size criteria. REPRODUCTIVE: Status post hysterectomy. OTHER: Small volume perihepatic and pelvic free fluid. No intraperitoneal free air. MUSCULOSKELETAL: Non-acute. Similar mild LEFT sacroiliac periarticular sclerosis. Severe L4-5 facet arthropathy. Subcentimeter sclerotic Schmorl's node T11, similar. Tiny fat containing umbilical hernia. IMPRESSION: 1. Small bowel enteritis. No bowel obstruction. Small volume ascites. Electronically Signed   By: Elon Alas M.D.   On: 04/22/2018 16:17     ____________________________________________   PROCEDURES  Procedure(s) performed: None Procedures Critical Care performed:   None ____________________________________________   INITIAL IMPRESSION / ASSESSMENT AND PLAN / ED COURSE  52 y.o. female with a history of chronic fatigue, fibromyalgia, panic attacks, anxiety who presents for evaluation of nausea, vomiting, diarrhea, cough.  Patient is well-appearing, she is in no distress, she is mildly tachycardic but remaining of her vitals are within normal limits, her abdomen has diffuse tenderness throughout with no rebound or guarding, she has faint wheezes with good air movement bilaterally.  Chest x-ray is negative for pneumonia.  Labs showing mild leukocytosis with white count of 13 but does show new acute kidney injury with a creatinine of 2.33 (0.78 baseline). No electrolyte abnormalities. Presentation concerning for viral illness causing severe dehydration and AKI. Due to significant diffuse ttp of the abdomen, CT a/p is pending. UA pending. Will give IVF, zofran, and duoneb. No indication for abx at this time.     _________________________ 4:32 PM on 04/22/2018 -----------------------------------------  CT concerning for enteritis.  Patient is receiving fluids.  No longer vomiting after Zofran.  Due to active infection and new acute kidney injury patient will be admitted to the hospitalist for further management.   As part of my medical decision making, I reviewed the following data within the [facility] notes reviewed and incorporated, Labs reviewed , Old chart reviewed, Radiograph reviewed , Discussed with admitting physician , Notes from prior ED visits and [place] Controlled Substance Database    Pertinent labs & imaging results that were available during my care of the patient were reviewed by me and considered in my medical decision making (see chart for details).    ____________________________________________   FINAL CLINICAL IMPRESSION(S) / ED DIAGNOSES  Final diagnoses:  AKI (acute kidney injury) ([place])  Viral illness  Nausea  vomiting and diarrhea  Cough  Enteritis      NEW MEDICATIONS STARTED DURING THIS VISIT:  ED Discharge Orders    None       Note:  This document was prepared using Dragon voice recognition software and may include unintentional dictation errors.    Rudene Re, MD 04/22/18 [phone]

## 2018-04-22 NOTE — ED Notes (Signed)
Admitting MD at bedside at this time.

## 2018-04-22 NOTE — ED Notes (Signed)
Pt given box of tissues at this time.

## 2018-04-23 LAB — GASTROINTESTINAL PANEL BY PCR, STOOL (REPLACES STOOL CULTURE)

## 2018-04-23 LAB — BASIC METABOLIC PANEL
Anion gap: 11 (ref 5–15)
BUN: 34 mg/dL — AB (ref 6–20)
CO2: 23 mmol/L (ref 22–32)
CREATININE: 1.83 mg/dL — AB (ref 0.44–1.00)
Calcium: 10.8 mg/dL — ABNORMAL HIGH (ref 8.9–10.3)
Chloride: 101 mmol/L (ref 101–111)
GFR calc non Af Amer: 31 mL/min — ABNORMAL LOW (ref >60)
GFR, EST AFRICAN AMERICAN: 36 mL/min — AB (ref >60)
GLUCOSE: 162 mg/dL — AB (ref 65–99)
Potassium: 4 mmol/L (ref 3.5–5.1)
Sodium: 135 mmol/L (ref 135–145)

## 2018-04-23 LAB — CBC
HEMATOCRIT: 33.1 % — AB (ref 35.0–47.0)
HEMOGLOBIN: 10.9 g/dL — AB (ref 12.0–16.0)
MCH: 27.2 pg (ref 26.0–34.0)
MCHC: 32.9 g/dL (ref 32.0–36.0)
MCV: 82.7 fL (ref 80.0–100.0)
Platelets: 344 10*3/uL (ref 150–440)
RBC: 4 MIL/uL (ref 3.80–5.20)
RDW: 16.1 % — AB (ref 11.5–14.5)
WBC: 12 10*3/uL — ABNORMAL HIGH (ref 3.6–11.0)

## 2018-04-23 LAB — C DIFFICILE QUICK SCREEN W PCR REFLEX
C Diff antigen: POSITIVE — AB
C Diff toxin: NEGATIVE — AB

## 2018-04-23 LAB — CLOSTRIDIUM DIFFICILE BY PCR, REFLEXED

## 2018-04-23 MED ORDER — FAMOTIDINE IN NACL 20-0.9 MG/50ML-% IV SOLN
20.0000 mg | Freq: Once | INTRAVENOUS | Status: AC
  Administered 2018-04-23: 23:00:00 20 mg via INTRAVENOUS
  Filled 2018-04-23: qty 50

## 2018-04-23 MED ORDER — AMLODIPINE BESYLATE 10 MG PO TABS: 10.0000 mg | ORAL_TABLET | Freq: Every day | ORAL | Status: DC

## 2018-04-23 MED ORDER — AMLODIPINE BESYLATE 10 MG PO TABS
10.0000 mg | ORAL_TABLET | Freq: Every day | ORAL | Status: DC
  Administered 2018-04-23 – 2018-04-24 (×2): 10 mg via ORAL
  Filled 2018-04-23 (×3): qty 1

## 2018-04-23 MED ORDER — ALUM & MAG HYDROXIDE-SIMETH 200-200-20 MG/5ML PO SUSP
30.0000 mL | ORAL | Status: DC | PRN
  Administered 2018-04-23: 30 mL via ORAL
  Filled 2018-04-23: qty 30

## 2018-04-23 MED ORDER — DIPHENHYDRAMINE HCL 50 MG/ML IJ SOLN
12.5000 mg | Freq: Once | INTRAMUSCULAR | Status: AC
  Administered 2018-04-23: 05:00:00 12.5 mg via INTRAVENOUS
  Filled 2018-04-23 (×2): qty 0.25

## 2018-04-23 MED ORDER — BENZONATATE 100 MG PO CAPS
200.0000 mg | ORAL_CAPSULE | Freq: Three times a day (TID) | ORAL | Status: DC | PRN
  Administered 2018-04-23 – 2018-04-24 (×2): 200 mg via ORAL
  Filled 2018-04-23 (×2): qty 2

## 2018-04-23 MED ORDER — CLONAZEPAM 0.5 MG PO TABS
2.0000 mg | ORAL_TABLET | Freq: Two times a day (BID) | ORAL | Status: DC
  Administered 2018-04-23 – 2018-04-24 (×2): 2 mg via ORAL
  Filled 2018-04-23 (×2): qty 4

## 2018-04-23 MED ORDER — LABETALOL HCL 5 MG/ML IV SOLN
10.0000 mg | INTRAVENOUS | Status: DC | PRN
  Administered 2018-04-25: 16:00:00 10 mg via INTRAVENOUS
  Filled 2018-04-23: qty 4

## 2018-04-23 MED ORDER — TIZANIDINE HCL 4 MG PO TABS
4.0000 mg | ORAL_TABLET | Freq: Every day | ORAL | Status: DC
  Administered 2018-04-23 – 2018-04-25 (×3): 4 mg via ORAL
  Filled 2018-04-23 (×5): qty 1

## 2018-04-23 MED ORDER — HYDROXYZINE HCL 25 MG PO TABS
25.0000 mg | ORAL_TABLET | Freq: Three times a day (TID) | ORAL | Status: DC | PRN
  Administered 2018-04-23 – 2018-04-25 (×5): 25 mg via ORAL
  Filled 2018-04-23 (×5): qty 1

## 2018-04-23 NOTE — Progress Notes (Signed)
[facility] - [place] [facility]   PATIENT NAME: Victoria Guzman    MR#:  [id number]  DATE OF BIRTH:  01/09/1966  SUBJECTIVE:  CHIEF COMPLAINT:   Chief Complaint  Patient presents with  . Abdominal Pain  . Shortness of Breath   Vomiting and diarrhea improved.  Has mild cramping in his abdomen. Afebrile.  REVIEW OF SYSTEMS:    Review of Systems  Constitutional: Positive for malaise/fatigue. Negative for chills and fever.  HENT: Negative for sore throat.   Eyes: Negative for blurred vision, double vision and pain.  Respiratory: Negative for cough, hemoptysis, shortness of breath and wheezing.   Cardiovascular: Negative for chest pain, palpitations, orthopnea and leg swelling.  Gastrointestinal: Positive for abdominal pain, diarrhea and nausea. Negative for constipation, heartburn and vomiting.  Genitourinary: Negative for dysuria and hematuria.  Musculoskeletal: Negative for back pain and joint pain.  Skin: Negative for rash.  Neurological: Negative for sensory change, speech change, focal weakness and headaches.  Endo/Heme/Allergies: Does not bruise/bleed easily.  Psychiatric/Behavioral: Negative for depression. The patient is not nervous/anxious.     DRUG ALLERGIES:   Allergies  Allergen Reactions  . Quinolones     Other reaction(s): Unknown  . Zofran [Ondansetron Hcl]     Itch, and swelling of lip  . Moxifloxacin Rash    VITALS:  Blood pressure (!) 179/94, pulse (!) 102, temperature 98.1 F (36.7 C), temperature source Oral, resp. rate 15, height  (1.676 m), weight 91.5 kg (201 lb 11.2 oz), SpO2 100 %.  PHYSICAL EXAMINATION:   Physical Exam  GENERAL:  52 y.o.-year-old patient lying in the bed with no acute distress.  EYES: Pupils equal, round, reactive to light and accommodation. No scleral icterus. Extraocular muscles intact.  HEENT: Head atraumatic, normocephalic. Oropharynx and nasopharynx clear.  NECK:  Supple, no jugular venous  distention. No thyroid enlargement, no tenderness.  LUNGS: Normal breath sounds bilaterally, no wheezing, rales, rhonchi. No use of accessory muscles of respiration.  CARDIOVASCULAR: S1, S2 normal. No murmurs, rubs, or gallops.  ABDOMEN: Soft, diffuse tenderness on deep palpation, nondistended. Bowel sounds present. No organomegaly or mass.  EXTREMITIES: No cyanosis, clubbing or edema b/l.    NEUROLOGIC: Cranial nerves II through XII are intact. No focal Motor or sensory deficits b/l.   PSYCHIATRIC: The patient is alert and oriented x 3.  Anxious SKIN: No obvious rash, lesion, or ulcer.   LABORATORY PANEL:   CBC Recent Labs  Lab 04/23/18 0401  WBC 12.0*  HGB 10.9*  HCT 33.1*  PLT 344   ------------------------------------------------------------------------------------------------------------------ Chemistries  Recent Labs  Lab 04/23/18 0401  NA 135  K 4.0  CL 101  CO2 23  GLUCOSE 162*  BUN 34*  CREATININE 1.83*  CALCIUM 10.8*   ------------------------------------------------------------------------------------------------------------------  Cardiac Enzymes No results for input(s): TROPONINI in the last 168 hours. ------------------------------------------------------------------------------------------------------------------  RADIOLOGY:  Dg Chest 2 View  Result Date: 04/22/2018 CLINICAL DATA:  Abdominal pain.  Shortness of breath. EXAM: CHEST - 2 VIEW COMPARISON:  Chest radiograph 01/17/2017 FINDINGS: Normal cardiac and mediastinal contours. No consolidative pulmonary opacities. No pleural effusion or pneumothorax. Thoracic spine degenerative changes. IMPRESSION: No acute cardiopulmonary process. Electronically Signed   By: Annia Belt M.D.   On: 04/22/2018 15:18   Ct Renal Stone Study  Result Date: 04/22/2018 CLINICAL DATA:  Shortness of breath, abdominal pain, nausea and vomiting for 3 days. History of hysterectomy. EXAM: CT ABDOMEN AND PELVIS WITHOUT CONTRAST  TECHNIQUE: Multidetector CT imaging of the abdomen  and pelvis was performed following the standard protocol without IV contrast. COMPARISON:  CT abdomen and pelvis February 23, 2012. FINDINGS: LOWER CHEST: Lung bases are clear. The visualized heart size is normal. No pericardial effusion. Mild gas distended distal esophagus associated with reflux. HEPATOBILIARY: Subcentimeter gallstone without CT findings of acute cholecystitis. Normal noncontrast CT liver. PANCREAS: Normal. SPLEEN: Normal. ADRENALS/URINARY TRACT: Kidneys are orthotopic, demonstrating normal size and morphology. No nephrolithiasis, hydronephrosis; limited assessment for renal masses on this nonenhanced examination. The unopacified ureters are normal in course and caliber. Urinary bladder is partially distended and unremarkable. Normal adrenal glands. STOMACH/BOWEL: Multiple loops of mid and RIGHT lower quadrant small bowel wall thickening and surrounding mesenteric edema. Small bowel is normal in overall caliber. Mild colonic diverticulosis. The stomach, large bowel are normal in course and caliber without inflammatory changes, sensitivity decreased by lack of enteric contrast. Normal appendix. VASCULAR/LYMPHATIC: Aortoiliac vessels are normal in course and caliber. No lymphadenopathy by CT size criteria. REPRODUCTIVE: Status post hysterectomy. OTHER: Small volume perihepatic and pelvic free fluid. No intraperitoneal free air. MUSCULOSKELETAL: Non-acute. Similar mild LEFT sacroiliac periarticular sclerosis. Severe L4-5 facet arthropathy. Subcentimeter sclerotic Schmorl's node T11, similar. Tiny fat containing umbilical hernia. IMPRESSION: 1. Small bowel enteritis. No bowel obstruction. Small volume ascites. Electronically Signed   By: Awilda Metro M.D.   On: 04/22/2018 16:17     ASSESSMENT AND PLAN:   Patient is 52 year old presenting with nausea vomiting and diarrhea  *Enteritis. Sepsis present on admission.  Leukocytosis and  tachycardia.  Will start IV antibiotics.  C. difficile antigen positive but toxin negative.  PCR pending.  Will also order stool PCR. Findings not typical for C. difficile.  *Acute kidney injury due to dehydration.  We will continue IV fluids.  Monitor input and output.  Repeat labs in the morning.  *Cough.  Likely allergic with patient's history and description.  Cough medications.  *  Anxiety disorder continue clonazepam   All the records are reviewed and case discussed with Care Management/Social Workerr. Management plans discussed with the patient, family and they are in agreement.  CODE STATUS: Full code  DVT Prophylaxis: SCDs  TOTAL TIME TAKING CARE OF THIS PATIENT: 35 minutes.   POSSIBLE D/C IN 1-2 DAYS, DEPENDING ON CLINICAL CONDITION.  Molinda Bailiff Maebelle Sulton M.D on 04/23/2018 at 1:34 PM  Between 7am to 6pm - Pager - [phone]  After 6pm go to [email or web address] - password [email or web address]  [facility] [place] [facility]  Office  [phone]  CC: Primary care physician; Patient, No Pcp Per  Note: This dictation was prepared with Dragon dictation along with smaller phrase technology. Any transcriptional errors that result from this process are unintentional.

## 2018-04-24 DIAGNOSIS — N179 Acute kidney failure, unspecified: Secondary | ICD-10-CM | POA: Diagnosis present

## 2018-04-24 LAB — CBC WITH DIFFERENTIAL/PLATELET
Basophils Absolute: 0 10*3/uL (ref 0–0.1)
Basophils Relative: 0 %
Eosinophils Absolute: 0.5 10*3/uL (ref 0–0.7)
Eosinophils Relative: 5 %
HEMATOCRIT: 28.4 % — AB (ref 35.0–47.0)
HEMOGLOBIN: 9.6 g/dL — AB (ref 12.0–16.0)
LYMPHS ABS: 1.7 10*3/uL (ref 1.0–3.6)
Lymphocytes Relative: 18 %
MCH: 28.1 pg (ref 26.0–34.0)
MCHC: 33.7 g/dL (ref 32.0–36.0)
MCV: 83.4 fL (ref 80.0–100.0)
MONO ABS: 0.6 10*3/uL (ref 0.2–0.9)
MONOS PCT: 7 %
NEUTROS PCT: 70 %
Neutro Abs: 6.6 10*3/uL — ABNORMAL HIGH (ref 1.4–6.5)
Platelets: 246 10*3/uL (ref 150–440)
RBC: 3.41 MIL/uL — ABNORMAL LOW (ref 3.80–5.20)
RDW: 16.1 % — AB (ref 11.5–14.5)
WBC: 9.5 10*3/uL (ref 3.6–11.0)

## 2018-04-24 LAB — BASIC METABOLIC PANEL
Anion gap: 4 — ABNORMAL LOW (ref 5–15)
BUN: 23 mg/dL — AB (ref 6–20)
CALCIUM: 9 mg/dL (ref 8.9–10.3)
CHLORIDE: 107 mmol/L (ref 101–111)
CO2: 25 mmol/L (ref 22–32)
CREATININE: 1.69 mg/dL — AB (ref 0.44–1.00)
GFR calc Af Amer: 40 mL/min — ABNORMAL LOW (ref >60)
GFR calc non Af Amer: 34 mL/min — ABNORMAL LOW (ref >60)
Glucose, Bld: 143 mg/dL — ABNORMAL HIGH (ref 65–99)
Potassium: 3.6 mmol/L (ref 3.5–5.1)
SODIUM: 136 mmol/L (ref 135–145)

## 2018-04-24 LAB — HEPATIC FUNCTION PANEL
ALK PHOS: 41 U/L (ref 38–126)
ALT: 12 U/L — ABNORMAL LOW (ref 14–54)
AST: 19 U/L (ref 15–41)
Albumin: 3.2 g/dL — ABNORMAL LOW (ref 3.5–5.0)
BILIRUBIN TOTAL: 0.4 mg/dL (ref 0.3–1.2)
Total Protein: 6.1 g/dL — ABNORMAL LOW (ref 6.5–8.1)

## 2018-04-24 MED ORDER — PREDNISONE 50 MG PO TABS: 50.0000 mg | ORAL_TABLET | Freq: Every day | ORAL | 0 refills | Status: DC

## 2018-04-24 MED ORDER — OXYCODONE-ACETAMINOPHEN 5-325 MG PO TABS: 1.0000 | ORAL_TABLET | Freq: Three times a day (TID) | ORAL | 0 refills | Status: DC | PRN

## 2018-04-24 MED ORDER — CLONAZEPAM 0.5 MG PO TABS: 1.0000 mg | ORAL_TABLET | Freq: Every day | ORAL | Status: DC

## 2018-04-24 MED ORDER — HYDROCORTISONE 0.5 % EX CREA
TOPICAL_CREAM | Freq: Three times a day (TID) | CUTANEOUS | Status: DC | PRN
  Administered 2018-04-24 – 2018-04-25 (×2): via TOPICAL
  Filled 2018-04-24 (×2): qty 28.35

## 2018-04-24 MED ORDER — AMOXICILLIN-POT CLAVULANATE 875-125 MG PO TABS: 1.0000 | ORAL_TABLET | Freq: Two times a day (BID) | ORAL | Status: DC

## 2018-04-24 MED ORDER — METHYLPREDNISOLONE SODIUM SUCC 125 MG IJ SOLR
125.0000 mg | INTRAMUSCULAR | Status: AC
  Administered 2018-04-24: 125 mg via INTRAVENOUS
  Filled 2018-04-24: qty 2

## 2018-04-24 MED ORDER — METOPROLOL TARTRATE 25 MG PO TABS
25.0000 mg | ORAL_TABLET | Freq: Two times a day (BID) | ORAL | Status: DC
  Filled 2018-04-24: qty 1

## 2018-04-24 MED ORDER — DIPHENHYDRAMINE HCL 25 MG PO CAPS
25.0000 mg | ORAL_CAPSULE | Freq: Four times a day (QID) | ORAL | Status: DC | PRN
  Administered 2018-04-24 – 2018-04-26 (×2): 25 mg via ORAL
  Filled 2018-04-24 (×2): qty 1

## 2018-04-24 MED ORDER — AMLODIPINE BESYLATE 10 MG PO TABS: 10.0000 mg | ORAL_TABLET | Freq: Every day | ORAL | 0 refills | Status: DC

## 2018-04-24 MED ORDER — DIPHENHYDRAMINE HCL 25 MG PO CAPS
25.0000 mg | ORAL_CAPSULE | Freq: Once | ORAL | Status: AC
  Administered 2018-04-24: 25 mg via ORAL
  Filled 2018-04-24: qty 1

## 2018-04-24 MED ORDER — ZOLPIDEM TARTRATE 5 MG PO TABS: 5.0000 mg | ORAL_TABLET | Freq: Every evening | ORAL | 0 refills | Status: DC | PRN

## 2018-04-24 NOTE — Progress Notes (Addendum)
[employer] [facility] - [place] at [facility]   PATIENT NAME: Victoria Guzman    MR#:  [id number]  DATE OF BIRTH:  1966-09-18  SUBJECTIVE:  CHIEF COMPLAINT:   Chief Complaint  Patient presents with  . Abdominal Pain  . Shortness of Breath   Vomiting and diarrhea improved. Itching in b/l arms  REVIEW OF SYSTEMS:    Review of Systems  Constitutional: Positive for malaise/fatigue. Negative for chills and fever.  HENT: Negative for sore throat.   Eyes: Negative for blurred vision, double vision and pain.  Respiratory: Negative for cough, hemoptysis, shortness of breath and wheezing.   Cardiovascular: Negative for chest pain, palpitations, orthopnea and leg swelling.  Gastrointestinal: Positive for abdominal pain, diarrhea and nausea. Negative for constipation, heartburn and vomiting.  Genitourinary: Negative for dysuria and hematuria.  Musculoskeletal: Negative for back pain and joint pain.  Skin: Negative for rash.  Neurological: Negative for sensory change, speech change, focal weakness and headaches.  Endo/Heme/Allergies: Does not bruise/bleed easily.  Psychiatric/Behavioral: Negative for depression. The patient is not nervous/anxious.     DRUG ALLERGIES:   Allergies  Allergen Reactions  . Quinolones     Other reaction(s): Unknown  . Zofran [Ondansetron Hcl]     Itch, and swelling of lip  . Moxifloxacin Rash    VITALS:  Blood pressure (!) 165/88, pulse (!) 102, temperature 97.9 F (36.6 C), temperature source Oral, resp. rate 16, height  (1.676 m), weight 91.5 kg (201 lb 11.2 oz), SpO2 97 %.  PHYSICAL EXAMINATION:   Physical Exam  GENERAL:  52 y.o.-year-old patient lying in the bed with no acute distress.  EYES: Pupils equal, round, reactive to light and accommodation. No scleral icterus. Extraocular muscles intact.  HEENT: Head atraumatic, normocephalic. Oropharynx and nasopharynx clear.  NECK:  Supple, no jugular venous distention. No thyroid  enlargement, no tenderness.  LUNGS: Normal breath sounds bilaterally, no wheezing, rales, rhonchi. No use of accessory muscles of respiration.  CARDIOVASCULAR: S1, S2 normal. No murmurs, rubs, or gallops.  ABDOMEN: Soft, diffuse tenderness on deep palpation, nondistended. Bowel sounds present. No organomegaly or mass.  EXTREMITIES: No cyanosis, clubbing or edema b/l.    NEUROLOGIC: Cranial nerves II through XII are intact. No focal Motor or sensory deficits b/l.   PSYCHIATRIC: The patient is alert and oriented x 3.  Anxious SKIN: No obvious rash, lesion, or ulcer.   LABORATORY PANEL:   CBC Recent Labs  Lab 04/24/18 0443  WBC 9.5  HGB 9.6*  HCT 28.4*  PLT 246   ------------------------------------------------------------------------------------------------------------------ Chemistries  Recent Labs  Lab 04/24/18 0443  NA 136  K 3.6  CL 107  CO2 25  GLUCOSE 143*  BUN 23*  CREATININE 1.69*  CALCIUM 9.0  AST 19  ALT 12*  ALKPHOS 41  BILITOT 0.4   ------------------------------------------------------------------------------------------------------------------  Cardiac Enzymes No results for input(s): TROPONINI in the last 168 hours. ------------------------------------------------------------------------------------------------------------------  RADIOLOGY:  No results found.   ASSESSMENT AND PLAN:   Patient is 52 year old presenting with nausea vomiting and diarrhea  *Enteritis. Sepsis present on admission.  Leukocytosis and tachycardia.  Will start IV antibiotics.   C diff and Stool PCR negative Now resolved  *Acute kidney injury due to dehydration.  We will continue IV fluids.  Monitor input and output  * HTN Lisinopril and HCTZ stopped. Started on Norvasc..  *Cough.  Likely allergic with patient's history and description.  Cough medications.  *  Anxiety disorder continue clonazepam  * Allergy 1 dose solumedrol given  All the records are  reviewed and case discussed with Care Management/Social Workerr. Management plans discussed with the patient, family and they are in agreement.  CODE STATUS: Full code  DVT Prophylaxis: SCDs  TOTAL TIME TAKING CARE OF THIS PATIENT: 35 minutes.   POSSIBLE D/C IN 1-2 DAYS, DEPENDING ON CLINICAL CONDITION.  Molinda Bailiff Higinio Grow M.D on 04/24/2018 at 5:19 PM  Between 7am to 6pm - Pager - [phone]  After 6pm go to [email or web address] - password [email or web address]  [facility] [place] [facility]  Office  [phone]  CC: Primary care physician; Patient, No Pcp Per  Note: This dictation was prepared with Dragon dictation along with smaller phrase technology. Any transcriptional errors that result from this process are unintentional.

## 2018-04-24 NOTE — Care Management Obs Status (Signed)
MEDICARE OBSERVATION STATUS NOTIFICATION   Patient Details  Name: Victoria Guzman MRN: [id number] Date of Birth: 08/06/1966   Medicare Observation Status Notification Given:  Yes    Amin Fornwalt S Kaleiyah Polsky, RN 04/24/2018, 3:28 PM 

## 2018-04-24 NOTE — Discharge Instructions (Addendum)
Soft diet for 2-3 days and then advance as tolerated  You will need your Kidney function checked on blood work in 1 week

## 2018-04-24 NOTE — Care Management Obs Status (Signed)
MEDICARE OBSERVATION STATUS NOTIFICATION   Patient Details  Name: Victoria Guzman MRN: [id number] Date of Birth: March 21, 1966   Medicare Observation Status Notification Given:  Yes    Gwenette Greet, RN 04/24/2018, 3:28 PM

## 2018-04-25 ENCOUNTER — Observation Stay: Payer: Medicare Other

## 2018-04-25 MED ORDER — ZOLPIDEM TARTRATE 5 MG PO TABS
5.0000 mg | ORAL_TABLET | Freq: Every evening | ORAL | Status: DC | PRN
  Administered 2018-04-25: 5 mg via ORAL
  Filled 2018-04-25: qty 1

## 2018-04-25 MED ORDER — HYDRALAZINE HCL 20 MG/ML IJ SOLN
10.0000 mg | Freq: Four times a day (QID) | INTRAMUSCULAR | Status: DC | PRN
  Administered 2018-04-25: 10 mg via INTRAVENOUS
  Filled 2018-04-25: qty 1

## 2018-04-25 MED ORDER — LISINOPRIL 20 MG PO TABS
20.0000 mg | ORAL_TABLET | Freq: Every day | ORAL | Status: DC
  Administered 2018-04-25 – 2018-04-26 (×2): 20 mg via ORAL
  Filled 2018-04-25 (×2): qty 1

## 2018-04-25 MED ORDER — ZOLPIDEM TARTRATE 5 MG PO TABS
5.0000 mg | ORAL_TABLET | Freq: Every evening | ORAL | Status: DC | PRN
Start: 2018-04-25 — End: 2018-04-26

## 2018-04-25 MED ORDER — HYDROCHLOROTHIAZIDE 25 MG PO TABS
25.0000 mg | ORAL_TABLET | Freq: Every day | ORAL | Status: DC
  Administered 2018-04-25 – 2018-04-26 (×2): 25 mg via ORAL
  Filled 2018-04-25 (×2): qty 1

## 2018-04-25 MED ORDER — GUAIFENESIN-DM 100-10 MG/5ML PO SYRP
5.0000 mL | ORAL_SOLUTION | ORAL | Status: DC | PRN
  Filled 2018-04-25: qty 5

## 2018-04-25 MED ORDER — CLONIDINE HCL 0.1 MG PO TABS
0.1000 mg | ORAL_TABLET | Freq: Once | ORAL | Status: AC
  Administered 2018-04-25: 20:00:00 0.1 mg via ORAL
  Filled 2018-04-25: qty 1

## 2018-04-25 MED ORDER — HYDRALAZINE HCL 20 MG/ML IJ SOLN
10.0000 mg | Freq: Once | INTRAMUSCULAR | Status: DC
  Filled 2018-04-25: qty 1

## 2018-04-25 MED ORDER — ZOLPIDEM TARTRATE 5 MG PO TABS: 10.0000 mg | ORAL_TABLET | Freq: Every evening | ORAL | Status: DC | PRN

## 2018-04-25 NOTE — Progress Notes (Signed)
[facility] - [place] at [facility]   PATIENT NAME: Victoria Guzman    MR#:  [id number]  DATE OF BIRTH:  Oct 27, 1966  SUBJECTIVE:  CHIEF COMPLAINT:   Chief Complaint  Patient presents with  . Abdominal Pain  . Shortness of Breath   Vomiting and diarrhea resolved.  Feels weak. Has headcahe.  Says she has lot of fluid on her body and she knows.  REVIEW OF SYSTEMS:    Review of Systems  Constitutional: Positive for malaise/fatigue. Negative for chills and fever.  HENT: Negative for sore throat.   Eyes: Negative for blurred vision, double vision and pain.  Respiratory: Negative for cough, hemoptysis, shortness of breath and wheezing.   Cardiovascular: Negative for chest pain, palpitations, orthopnea and leg swelling.  Gastrointestinal: Positive for abdominal pain, diarrhea and nausea. Negative for constipation, heartburn and vomiting.  Genitourinary: Negative for dysuria and hematuria.  Musculoskeletal: Negative for back pain and joint pain.  Skin: Negative for rash.  Neurological: Negative for sensory change, speech change, focal weakness and headaches.  Endo/Heme/Allergies: Does not bruise/bleed easily.  Psychiatric/Behavioral: Negative for depression. The patient is not nervous/anxious.     DRUG ALLERGIES:   Allergies  Allergen Reactions  . Quinolones     Other reaction(s): Unknown  . Zofran [Ondansetron Hcl]     Itch, and swelling of lip  . Moxifloxacin Rash    VITALS:  Blood pressure (!) 174/88, pulse 96, temperature 98.1 F (36.7 C), temperature source Oral, resp. rate 18, height  (1.676 m), weight 91.5 kg (201 lb 11.2 oz), SpO2 100 %.  PHYSICAL EXAMINATION:   Physical Exam  GENERAL:  52 y.o.-year-old patient lying in the bed with no acute distress.  EYES: Pupils equal, round, reactive to light and accommodation. No scleral icterus. Extraocular muscles intact.  HEENT: Head atraumatic, normocephalic. Oropharynx and nasopharynx clear.   NECK:  Supple, no jugular venous distention. No thyroid enlargement, no tenderness.  LUNGS: Normal breath sounds bilaterally, no wheezing, rales, rhonchi. No use of accessory muscles of respiration.  CARDIOVASCULAR: S1, S2 normal. No murmurs, rubs, or gallops.  ABDOMEN: Soft, non tender,nondistended. Bowel sounds present. No organomegaly or mass.  EXTREMITIES: No cyanosis, clubbing or edema b/l.    NEUROLOGIC: Cranial nerves II through XII are intact. No focal Motor or sensory deficits b/l.   PSYCHIATRIC: The patient is alert and oriented x 3.  Anxious SKIN: No obvious rash, lesion, or ulcer.   LABORATORY PANEL:   CBC Recent Labs  Lab 04/24/18 0443  WBC 9.5  HGB 9.6*  HCT 28.4*  PLT 246   ------------------------------------------------------------------------------------------------------------------ Chemistries  Recent Labs  Lab 04/24/18 0443  NA 136  K 3.6  CL 107  CO2 25  GLUCOSE 143*  BUN 23*  CREATININE 1.69*  CALCIUM 9.0  AST 19  ALT 12*  ALKPHOS 41  BILITOT 0.4   ------------------------------------------------------------------------------------------------------------------  Cardiac Enzymes No results for input(s): TROPONINI in the last 168 hours. ------------------------------------------------------------------------------------------------------------------  RADIOLOGY:  Dg Chest 2 View  Result Date: 04/25/2018 CLINICAL DATA:  Increased shortness of breath. EXAM: CHEST - 2 VIEW COMPARISON:  04/22/2018 FINDINGS: The cardiomediastinal silhouette is within normal limits for AP technique. The lungs are clear. No pleural effusion or pneumothorax is identified. No acute osseous abnormality is seen. IMPRESSION: No active cardiopulmonary disease. Electronically Signed   By: Sebastian Ache M.D.   On: 04/25/2018 16:30     ASSESSMENT AND PLAN:   Patient is 52 year old presenting with nausea vomiting and diarrhea  *  Enteritis. Sepsis present on admission.   Leukocytosis and tachycardia. Likely viral. Antibiotics not initiated as patient improved well.  C diff and Stool PCR negative Now resolved  *Acute kidney injury due to dehydration.  IVF stopped  * HTN Lisinopril and HCTZ stopped as patient refused them initially. Norvasc and metoprolol started. Today patient is denying the fact that we had discussed about stopping lisinopril and HCTZ as per wishes and starting her on norvasc.  She did not want to take home meds yesterday inspite of advising that those can be restarted. Today she is asking to be put back on her home meds  *Cough.  Likely allergic with patient's history and description.  Cough medications. Albuterol PRN CXR checked and showed no PNA or edema  *  Anxiety disorder. Decrease clonazepam dose due to drowziness and weakness  * Allergy 1 dose solumedrol given. Will continue prednisone.   Had multiple visits with patient today including talking to her sister. Extremely anxious and diffcult to come up with a plan with . She is denying conversations we had yesterday. Will monitor over night   All the records are reviewed and case discussed with Care Management/Social Workerr. Management plans discussed with the patient, family and they are in agreement.  CODE STATUS: Full code  DVT Prophylaxis: SCDs  TOTAL TIME TAKING CARE OF THIS PATIENT: 70 minutes.  > 50% time spent in co ordinating care and discussing with patient and family.  POSSIBLE D/C IN 1-2 DAYS, DEPENDING ON CLINICAL CONDITION.  Molinda Bailiff Neeraj Housand M.D on 04/25/2018 at 10:21 PM  Between 7am to 6pm - Pager - [phone]  After 6pm go to [email or web address] - password [email or web address]  [facility] [place] [facility]  Office  [phone]  CC: Primary care physician; Patient, No Pcp Per  Note: This dictation was prepared with Dragon dictation along with smaller phrase technology. Any transcriptional errors that result from this process are unintentional.

## 2018-04-25 NOTE — Care Management (Signed)
Patient questioning being admitted.  Says that her sister asked Dr  Auburn Bilberry in the ED on 5/19 " Is she being admitted.  Answer was yes" . Confirmed Initial order was for admission.  A code 44 was initiated on 04/24/2018. Informed that per [facility] contacted [facility] and denied inpatient.  Unable to confirm .  CM informed patient and she is adamant that her insurance company "would never do that."

## 2018-04-25 NOTE — Progress Notes (Signed)
Patient given labetalol for B/P 160/100. Patient expressed concerns that her admission status had changed from inpatient to observation. Patient states she confirmed on 5/19 that admission status was inpatient and that notice was not explained. Patient states she cannot afford medical bills due to being out of work on disability. Education provided to patient on reducing stress as her stress can be a contributor to her HTN at this time. Care Management in room following nurse's exit to discuss concerns with patient.

## 2018-04-25 NOTE — Progress Notes (Signed)
Patient c/o SOB and request to see MD and Social worker. O2 sats 100% HR 95, RR 22. Denied pain. Patient stated, "I can not go home. My insurance will not pay for this stay because they have me under observation. I need to be admitted". Spoke to Dr. Elpidio Anis and Social worker who will be in to assess and speak with patient. Endorse to primary nurse [facility]. And Set designer.

## 2018-04-26 LAB — BASIC METABOLIC PANEL
ANION GAP: 3 — AB (ref 5–15)
BUN: 25 mg/dL — ABNORMAL HIGH (ref 6–20)
CALCIUM: 9.4 mg/dL (ref 8.9–10.3)
CO2: 27 mmol/L (ref 22–32)
CREATININE: 1.33 mg/dL — AB (ref 0.44–1.00)
Chloride: 107 mmol/L (ref 101–111)
GFR calc Af Amer: 53 mL/min — ABNORMAL LOW (ref >60)
GFR, EST NON AFRICAN AMERICAN: 46 mL/min — AB (ref >60)
GLUCOSE: 128 mg/dL — AB (ref 65–99)
Potassium: 3.7 mmol/L (ref 3.5–5.1)
Sodium: 137 mmol/L (ref 135–145)

## 2018-04-26 MED ORDER — AMLODIPINE BESYLATE 5 MG PO TABS: 5.0000 mg | ORAL_TABLET | Freq: Every day | ORAL | Status: DC

## 2018-04-26 MED ORDER — AMLODIPINE BESYLATE 5 MG PO TABS: 5.0000 mg | ORAL_TABLET | Freq: Every day | ORAL | 0 refills | Status: DC

## 2018-04-26 MED ORDER — PREDNISONE 50 MG PO TABS
50.0000 mg | ORAL_TABLET | Freq: Once | ORAL | Status: AC
  Administered 2018-04-26: 50 mg via ORAL
  Filled 2018-04-26: qty 1

## 2018-04-26 MED ORDER — DIPHENHYDRAMINE HCL 50 MG/ML IJ SOLN
50.0000 mg | Freq: Once | INTRAMUSCULAR | Status: AC
  Administered 2018-04-26: 01:00:00 50 mg via INTRAVENOUS
  Filled 2018-04-26: qty 1

## 2018-04-26 MED ORDER — ZOLPIDEM TARTRATE 5 MG PO TABS
5.0000 mg | ORAL_TABLET | Freq: Once | ORAL | Status: AC
  Administered 2018-04-26: 02:00:00 5 mg via ORAL
  Filled 2018-04-26: qty 1

## 2018-04-26 NOTE — Progress Notes (Signed)
Patient transported home via car by her daughter.  Orson Ape, RN

## 2018-04-26 NOTE — Progress Notes (Signed)
Discharge instructions provided and reviewed. Prescriptions provided for pt. To take to pharmacy of choice and reviewd. No questions at time of discharge

## 2018-04-26 NOTE — Progress Notes (Signed)
04/25/2018 1930: Patient was given a one time dose of 0.1mg  of Clonidine for SBP>180  per Houston Siren, MD. Patient's SBP now in the 160s.  04/26/2018 0035 Patient c/o that she feels her mouth is becoming swollen, 1 time dose of  IV was administered per Cammy Copa, MD.

## 2018-05-14 NOTE — Discharge Summary (Signed)
[facility] - [place] at [facility]   PATIENT NAME: Victoria Guzman    MR#:  [id number]  DATE OF BIRTH:  04-24-1966  DATE OF ADMISSION:  04/22/2018 ADMITTING PHYSICIAN: Auburn BilberryShreyang Patel, MD  DATE OF DISCHARGE: 04/26/2018  3:27 PM  PRIMARY CARE PHYSICIAN: Patient, No Pcp Per   ADMISSION DIAGNOSIS:  Cough [R05] Enteritis [K52.9] Viral illness [B34.9] AKI (acute kidney injury) (HCC) [N17.9] Nausea vomiting and diarrhea [R11.2, R19.7]  DISCHARGE DIAGNOSIS:  Active Problems:   Acute renal failure (ARF) (HCC)   ARF (acute renal failure) (HCC)   SECONDARY DIAGNOSIS:   Past Medical History:  Diagnosis Date  . Anxiety   . Chronic fatigue   . Chronic insomnia 05/31/2016  . Fibromyalgia   . Insomnia   . Panic attack   . Stroke (HCC)   . Vitamin D deficiency 05/31/2016     ADMITTING HISTORY  HISTORY OF PRESENT ILLNESS: Victoria KayMelissa Gopal  is a 52 y.o. female with a known history of anxiety, chronic fatigue, fibromyalgia presenting to the emergency room with complaint of nausea vomiting going on for a few days.  Patient also complains of epigastric pain sharp in nature.  She had a CT per renal protocol which showed some enteritis.  Patient also has history of recurrent bronchitis and complaining of some cough productive of whitish sputum.  She was very nauseous in the ED and after she got Zofran she started complaining of itching all over and then felt her lip was getting swollen.  Therefore had to receive Benadryl. She was having diarrhea which is resolved since yesterday.  HOSPITAL COURSE:   Patient is 52 year old presenting with nausea vomiting and diarrhea  * Enteritis Sepsis present on admission. Leukocytosis and tachycardia. Likely viral. Antibiotics not initiated as patient improved well.  C diff and Stool PCR negative abdominal pain and diarrhea resolved by day of discharge  *Acute kidney injury due to dehydration.  IVF stopped resolved  *  HTN Lisinopril and HCTZ stopped as patient refused them initially. Norvasc and metoprolol started. Later patient is denied the fact that we had discussed about stopping lisinopril and HCTZ as per her wishes and starting her on norvasc.  She did not want to take home meds yesterday inspite of advising that those can be restarted. Later she requested to be restarted on home medications. These were restarted. Follow-up with primary care physician.  *Cough.  Likely allergic with patient's history and description.  Cough medications. Albuterol PRN CXR checked and showed no PNA or edema  *Anxiety disorder. Decrease clonazepam dose due to drowziness and weakness  * Allergy 1 dose solumedrol given. Will continue prednisone after discharge for three more days itching resolved  patient stable for discharge to follow up with primary care physician.    CONSULTS OBTAINED:    DRUG ALLERGIES:   Allergies  Allergen Reactions  . Clonidine Derivatives Hives  . Quinolones     Other reaction(s): Unknown  . Zofran [Ondansetron Hcl]     Itch, and swelling of lip  . Moxifloxacin Rash    DISCHARGE MEDICATIONS:   Allergies as of 04/26/2018      Reactions   Clonidine Derivatives Hives   Quinolones    Other reaction(s): Unknown   Zofran [ondansetron Hcl]    Itch, and swelling of lip   Moxifloxacin Rash      Medication List    STOP taking these medications   azithromycin 500 MG tablet Commonly known as:  ZITHROMAX   cefUROXime 500 MG  tablet Commonly known as:  CEFTIN   lisinopril 20 MG tablet Commonly known as:  PRINIVIL,ZESTRIL   predniSONE 10 MG (21) Tbpk tablet Commonly known as:  STERAPRED UNI-PAK 21 TAB Replaced by:  predniSONE 50 MG tablet     TAKE these medications   albuterol 108 (90 Base) MCG/ACT inhaler Commonly known as:  PROAIR HFA Inhale 2 puffs into the lungs as needed.   albuterol 108 (90 Base) MCG/ACT inhaler Commonly known as:  PROVENTIL HFA;VENTOLIN  HFA Inhale 2 puffs into the lungs every 4 (four) hours as needed for wheezing or shortness of breath.   aspirin 81 MG EC tablet Commonly known as:  [employer] ASPIRIN LOW DOSE Take 1 tablet (81 mg total) by mouth daily.   chlorpheniramine-HYDROcodone 10-8 MG/5ML Suer Commonly known as:  TUSSIONEX PENNKINETIC ER Take 5 mLs by mouth every 12 (twelve) hours as needed.   chlorpheniramine-HYDROcodone 10-8 MG/5ML Suer Commonly known as:  TUSSIONEX PENNKINETIC ER Take 5 mLs by mouth every 12 (twelve) hours as needed for cough.   clonazePAM 1 MG tablet Commonly known as:  KLONOPIN Take 1 tablet (1 mg total) by mouth 2 (two) times daily. What changed:  Another medication with the same name was removed. Continue taking this medication, and follow the directions you see here.   escitalopram 10 MG tablet Commonly known as:  LEXAPRO Take 1 tablet by mouth daily.   estradiol 0.1 MG/GM vaginal cream Commonly known as:  ESTRACE VAGINAL Place 1 Applicatorful vaginally at bedtime.   folic acid 1 MG tablet Commonly known as:  FOLVITE Take 1 mg by mouth daily.   ketoconazole 2 % cream Commonly known as:  NIZORAL Apply 1 application topically 2 (two) times daily.   lisinopril-hydrochlorothiazide 10-12.5 MG tablet Commonly known as:  PRINZIDE,ZESTORETIC Take 1 tablet by mouth 2 (two) times daily.   oxyCODONE-acetaminophen 5-325 MG tablet Commonly known as:  PERCOCET/ROXICET Take 1 tablet by mouth every 8 (eight) hours as needed for moderate pain.   predniSONE 50 MG tablet Commonly known as:  DELTASONE Take 1 tablet (50 mg total) by mouth daily. Replaces:  predniSONE 10 MG (21) Tbpk tablet   terconazole 80 MG vaginal suppository Commonly known as:  TERAZOL 3 Place 1 suppository (80 mg total) vaginally at bedtime.   tiZANidine 4 MG tablet Commonly known as:  ZANAFLEX Take 4 mg by mouth 3 (three) times daily.   tiZANidine 4 MG capsule Commonly known as:  ZANAFLEX Take 1 capsule (4 mg  total) by mouth 3 (three) times daily.   zolpidem 5 MG tablet Commonly known as:  AMBIEN Take 1 tablet (5 mg total) by mouth at bedtime as needed for sleep. What changed:  Another medication with the same name was removed. Continue taking this medication, and follow the directions you see here.       Today   VITAL SIGNS:  Blood pressure (!) 159/92, pulse 85, temperature 98.1 F (36.7 C), temperature source Oral, resp. rate 18, height 5\' 6"  (1.676 m), weight 91.5 kg (201 lb 11.2 oz), SpO2 99 %.  I/O:  No intake or output data in the 24 hours ending 05/14/18 1655  PHYSICAL EXAMINATION:  Physical Exam  GENERAL:  52 y.o.-year-old patient lying in the bed with no acute distress.  LUNGS: Normal breath sounds bilaterally, no wheezing, rales,rhonchi or crepitation. No use of accessory muscles of respiration.  CARDIOVASCULAR: S1, S2 normal. No murmurs, rubs, or gallops.  ABDOMEN: Soft, non-tender, non-distended. Bowel sounds present. No organomegaly or mass.  NEUROLOGIC: Moves all 4 extremities. PSYCHIATRIC: The patient is alert and oriented x 3.  SKIN: No obvious rash, lesion, or ulcer.   DATA REVIEW:   CBC No results for input(s): WBC, HGB, HCT, PLT in the last 168 hours.  Chemistries  No results for input(s): NA, K, CL, CO2, GLUCOSE, BUN, CREATININE, CALCIUM, MG, AST, ALT, ALKPHOS, BILITOT in the last 168 hours.  Invalid input(s): GFRCGP  Cardiac Enzymes No results for input(s): TROPONINI in the last 168 hours.  Microbiology Results  Results for orders placed or performed during the hospital encounter of 04/22/18  C difficile quick scan w PCR reflex     Status: Abnormal   Collection Time: 04/22/18 11:38 PM  Result Value Ref Range Status   C Diff antigen POSITIVE (A) NEGATIVE Final   C Diff toxin NEGATIVE (A) NEGATIVE Final   C Diff interpretation Results are indeterminate. See PCR results.  Final    Comment: Performed at [facility], [address]  [place], [place] [address]  Gastrointestinal Panel by PCR , Stool     Status: None   Collection Time: 04/22/18 11:38 PM  Result Value Ref Range Status   Campylobacter species NOT DETECTED NOT DETECTED Final   Plesimonas shigelloides NOT DETECTED NOT DETECTED Final   Salmonella species NOT DETECTED NOT DETECTED Final   Yersinia enterocolitica NOT DETECTED NOT DETECTED Final   Vibrio species NOT DETECTED NOT DETECTED Final   Vibrio cholerae NOT DETECTED NOT DETECTED Final   Enteroaggregative E coli (EAEC) NOT DETECTED NOT DETECTED Final   Enteropathogenic E coli (EPEC) NOT DETECTED NOT DETECTED Final   Enterotoxigenic E coli (ETEC) NOT DETECTED NOT DETECTED Final   Shiga like toxin producing E coli (STEC) NOT DETECTED NOT DETECTED Final   Shigella/Enteroinvasive E coli (EIEC) NOT DETECTED NOT DETECTED Final   Cryptosporidium NOT DETECTED NOT DETECTED Final   Cyclospora cayetanensis NOT DETECTED NOT DETECTED Final   Entamoeba histolytica NOT DETECTED NOT DETECTED Final   Giardia lamblia NOT DETECTED NOT DETECTED Final   Adenovirus F40/41 NOT DETECTED NOT DETECTED Final   Astrovirus NOT DETECTED NOT DETECTED Final   Norovirus GI/GII NOT DETECTED NOT DETECTED Final   Rotavirus A NOT DETECTED NOT DETECTED Final   Sapovirus (I, II, IV, and V) NOT DETECTED NOT DETECTED Final    Comment: Performed at [facility], [address] [place], [place] [address]  C. Diff by PCR, Reflexed     Status: None   Collection Time: 04/22/18 11:38 PM  Result Value Ref Range Status   Toxigenic C. Difficile by PCR  NEGATIVE Final    Comment: Performed at [facility], [address] [place], [place] [address]    RADIOLOGY:  No results found.  Follow up with PCP in 1 week.  Management plans discussed with the patient, family and they are in agreement.  CODE STATUS:  Code Status History    Date Active Date Inactive Code Status Order ID Comments User Context   04/22/2018 1848 04/26/2018 1832  Full Code [id number]  Auburn Bilberry, MD Inpatient      TOTAL TIME TAKING CARE OF THIS PATIENT ON DAY OF DISCHARGE: more than 30 minutes.   Molinda Bailiff Stefany Starace M.D on 05/14/2018 at 4:55 PM  Between 7am to 6pm - Pager - [phone]  After 6pm go to [email or web address] - password [email or web address] [facility]  [facility] [place] [facility]  Office  [phone]  CC: Primary care physician; Patient, No Pcp Per  Note: This dictation was  prepared with Dragon dictation along with smaller phrase technology. Any transcriptional errors that result from this process are unintentional.

## 2018-06-18 ENCOUNTER — Other Ambulatory Visit: Payer: Self-pay | Admitting: Obstetrics and Gynecology

## 2018-06-18 DIAGNOSIS — Z1231 Encounter for screening mammogram for malignant neoplasm of breast: Secondary | ICD-10-CM

## 2018-06-19 ENCOUNTER — Ambulatory Visit
Admission: RE | Admit: 2018-06-19 | Discharge: 2018-06-19 | Disposition: A | Discharge status: Home / Self Care | Payer: Medicare Other | Source: Ambulatory Visit | Attending: Obstetrics and Gynecology | Admitting: Obstetrics and Gynecology

## 2018-06-19 DIAGNOSIS — Z1231 Encounter for screening mammogram for malignant neoplasm of breast: Secondary | ICD-10-CM | POA: Diagnosis not present

## 2018-06-28 ENCOUNTER — Other Ambulatory Visit: Payer: Self-pay | Admitting: *Deleted

## 2018-06-28 ENCOUNTER — Inpatient Hospital Stay
Admission: RE | Admit: 2018-06-28 | Discharge: 2018-06-28 | Disposition: A | Discharge status: Home / Self Care | Payer: Self-pay | Source: Ambulatory Visit | Attending: *Deleted | Admitting: *Deleted

## 2018-06-28 DIAGNOSIS — Z9289 Personal history of other medical treatment: Secondary | ICD-10-CM

## 2018-07-24 ENCOUNTER — Ambulatory Visit
Admission: RE | Admit: 2018-07-24 | Discharge: 2018-07-24 | Disposition: A | Discharge status: Home / Self Care | Payer: Medicare Other | Source: Ambulatory Visit | Attending: Internal Medicine | Admitting: Internal Medicine

## 2018-07-24 ENCOUNTER — Encounter: Payer: Self-pay | Admitting: *Deleted

## 2018-07-24 ENCOUNTER — Other Ambulatory Visit: Payer: Self-pay

## 2018-07-24 ENCOUNTER — Ambulatory Visit: Payer: Medicare Other | Admitting: Anesthesiology

## 2018-07-24 ENCOUNTER — Encounter
Admission: RE | Disposition: A | Discharge status: Home / Self Care | Payer: Self-pay | Source: Ambulatory Visit | Attending: Internal Medicine

## 2018-07-24 DIAGNOSIS — I739 Peripheral vascular disease, unspecified: Secondary | ICD-10-CM | POA: Diagnosis not present

## 2018-07-24 DIAGNOSIS — R1310 Dysphagia, unspecified: Secondary | ICD-10-CM | POA: Diagnosis not present

## 2018-07-24 DIAGNOSIS — Z7982 Long term (current) use of aspirin: Secondary | ICD-10-CM | POA: Diagnosis not present

## 2018-07-24 DIAGNOSIS — M797 Fibromyalgia: Secondary | ICD-10-CM | POA: Diagnosis not present

## 2018-07-24 DIAGNOSIS — Z8673 Personal history of transient ischemic attack (TIA), and cerebral infarction without residual deficits: Secondary | ICD-10-CM | POA: Insufficient documentation

## 2018-07-24 DIAGNOSIS — Z79899 Other long term (current) drug therapy: Secondary | ICD-10-CM | POA: Diagnosis not present

## 2018-07-24 DIAGNOSIS — I10 Essential (primary) hypertension: Secondary | ICD-10-CM | POA: Insufficient documentation

## 2018-07-24 DIAGNOSIS — F329 Major depressive disorder, single episode, unspecified: Secondary | ICD-10-CM | POA: Insufficient documentation

## 2018-07-24 DIAGNOSIS — Z881 Allergy status to other antibiotic agents status: Secondary | ICD-10-CM | POA: Diagnosis not present

## 2018-07-24 DIAGNOSIS — R5382 Chronic fatigue, unspecified: Secondary | ICD-10-CM | POA: Insufficient documentation

## 2018-07-24 DIAGNOSIS — K640 First degree hemorrhoids: Secondary | ICD-10-CM | POA: Insufficient documentation

## 2018-07-24 DIAGNOSIS — Z7951 Long term (current) use of inhaled steroids: Secondary | ICD-10-CM | POA: Insufficient documentation

## 2018-07-24 DIAGNOSIS — K222 Esophageal obstruction: Secondary | ICD-10-CM | POA: Insufficient documentation

## 2018-07-24 DIAGNOSIS — Z888 Allergy status to other drugs, medicaments and biological substances status: Secondary | ICD-10-CM | POA: Diagnosis not present

## 2018-07-24 DIAGNOSIS — D649 Anemia, unspecified: Secondary | ICD-10-CM | POA: Diagnosis not present

## 2018-07-24 DIAGNOSIS — E559 Vitamin D deficiency, unspecified: Secondary | ICD-10-CM | POA: Insufficient documentation

## 2018-07-24 DIAGNOSIS — F410 Panic disorder [episodic paroxysmal anxiety] without agoraphobia: Secondary | ICD-10-CM | POA: Insufficient documentation

## 2018-07-24 DIAGNOSIS — Z90710 Acquired absence of both cervix and uterus: Secondary | ICD-10-CM | POA: Diagnosis not present

## 2018-07-24 DIAGNOSIS — F5104 Psychophysiologic insomnia: Secondary | ICD-10-CM | POA: Insufficient documentation

## 2018-07-24 DIAGNOSIS — F419 Anxiety disorder, unspecified: Secondary | ICD-10-CM | POA: Diagnosis not present

## 2018-07-24 DIAGNOSIS — Z1211 Encounter for screening for malignant neoplasm of colon: Secondary | ICD-10-CM | POA: Diagnosis present

## 2018-07-24 HISTORY — PX: COLONOSCOPY WITH PROPOFOL: SHX5780

## 2018-07-24 HISTORY — DX: Depression, unspecified: F32.A

## 2018-07-24 HISTORY — DX: Anemia, unspecified: D64.9

## 2018-07-24 HISTORY — DX: Major depressive disorder, single episode, unspecified: F32.9

## 2018-07-24 HISTORY — PX: ESOPHAGOGASTRODUODENOSCOPY (EGD) WITH PROPOFOL: SHX5813

## 2018-07-24 HISTORY — DX: Essential (primary) hypertension: I10

## 2018-07-24 SURGERY — ESOPHAGOGASTRODUODENOSCOPY (EGD) WITH PROPOFOL
Anesthesia: General

## 2018-07-24 MED ORDER — ACETAMINOPHEN 500 MG PO TABS
ORAL_TABLET | ORAL | Status: AC
  Filled 2018-07-24: qty 2

## 2018-07-24 MED ORDER — PROPOFOL 10 MG/ML IV BOLUS
INTRAVENOUS | Status: AC
  Filled 2018-07-24: qty 20

## 2018-07-24 MED ORDER — LIDOCAINE HCL (CARDIAC) PF 100 MG/5ML IV SOSY
PREFILLED_SYRINGE | INTRAVENOUS | Status: DC | PRN
  Administered 2018-07-24: 40 mg via INTRAVENOUS

## 2018-07-24 MED ORDER — IPRATROPIUM-ALBUTEROL 0.5-2.5 (3) MG/3ML IN SOLN
RESPIRATORY_TRACT | Status: AC
  Filled 2018-07-24: qty 3

## 2018-07-24 MED ORDER — SODIUM CHLORIDE 0.9 % IV SOLN
INTRAVENOUS | Status: DC
  Administered 2018-07-24: 13:00:00 via INTRAVENOUS

## 2018-07-24 MED ORDER — PROPOFOL 10 MG/ML IV BOLUS
INTRAVENOUS | Status: DC | PRN
  Administered 2018-07-24 (×2): 50 mg via INTRAVENOUS

## 2018-07-24 MED ORDER — IPRATROPIUM-ALBUTEROL 0.5-2.5 (3) MG/3ML IN SOLN
3.0000 mL | Freq: Once | RESPIRATORY_TRACT | Status: AC
  Administered 2018-07-24: 3 mL via RESPIRATORY_TRACT

## 2018-07-24 MED ORDER — PROPOFOL 500 MG/50ML IV EMUL
INTRAVENOUS | Status: DC | PRN
  Administered 2018-07-24: 175 ug/kg/min via INTRAVENOUS

## 2018-07-24 NOTE — Op Note (Signed)
[facility]astroenterology Patient Name: Victoria Guzman Procedure Date: 07/24/2018 1:21 PM MRN: [id number] Account #: [id number] Date of Birth: 06-13-1966 Admit Type: Outpatient Age: 52 Room: [facility] ENDO ROOM 2 Gender: Female Note Status: Finalized Procedure:            Upper GI endoscopy Indications:          Dysphagia Providers:            Boykin Nearing. Norma Fredrickson MD, MD Referring MD:         Marcelino Duster, MD (Referring MD) Medicines:            Propofol per Anesthesia Complications:        No immediate complications. Procedure:            Pre-Anesthesia Assessment:                       - The risks and benefits of the procedure and the                        sedation options and risks were discussed with the                        patient. All questions were answered and informed                        consent was obtained.                       - Patient identification and proposed procedure were                        verified prior to the procedure by the nurse. The                        procedure was verified in the procedure room.                       - ASA Grade Assessment: III - A patient with severe                        systemic disease.                       - After reviewing the risks and benefits, the patient                        was deemed in satisfactory condition to undergo the                        procedure.                       After obtaining informed consent, the endoscope was                        passed under direct vision. Throughout the procedure,                        the patient's blood pressure, pulse, and oxygen  saturations were monitored continuously. The Endoscope                        was introduced through the mouth, and advanced to the                        third part of duodenum. The upper GI endoscopy was                        accomplished without difficulty. The patient tolerated           the procedure well. Findings:      A non-obstructing Schatzki ring was found in the distal esophagus. The       scope was withdrawn. Dilation was performed with a Maloney dilator with       mild resistance at 54 Fr.      The entire examined stomach was normal.      The examined duodenum was normal.      The exam was otherwise without abnormality. Impression:           - Non-obstructing Schatzki ring. Dilated.                       - Normal stomach.                       - Normal examined duodenum.                       - The examination was otherwise normal.                       - No specimens collected. Recommendation:       - Monitor results to esophageal dilation                       - Proceed with colonoscopy Procedure Code(s):    --- Professional ---                       [id number], Esophagogastroduodenoscopy, flexible, transoral;                        diagnostic, including collection of specimen(s) by                        brushing or washing, when performed (separate procedure)                       43450, Dilation of esophagus, by unguided sound or                        bougie, single or multiple passes Diagnosis Code(s):    --- Professional ---                       R13.10, Dysphagia, unspecified                       K22.2, Esophageal obstruction CPT copyright 2017 American Medical Association. All rights reserved. The codes documented in this report are preliminary and upon coder review may  be revised to meet current compliance requirements. Stanton Kidneyeodoro K Toledo MD, MD 07/24/2018 1:58:14 PM This report has been  signed electronically. Number of Addenda: 0 Note Initiated On: 07/24/2018 1:21 PM      [facility]

## 2018-07-24 NOTE — Interval H&P Note (Signed)
History and Physical Interval Note:  07/24/2018 1:14 PM  Victoria Guzman  has presented today for surgery, with the diagnosis of PHARYNESOPHANGEAL L DYSPHAGIA  The various methods of treatment have been discussed with the patient and family. After consideration of risks, benefits and other options for treatment, the patient has consented to  Procedure(s): ESOPHAGOGASTRODUODENOSCOPY (EGD) WITH PROPOFOL (N/A) COLONOSCOPY WITH PROPOFOL (N/A) as a surgical intervention .  The patient's history has been reviewed, patient examined, no change in status, stable for surgery.  I have reviewed the patient's chart and labs.  Questions were answered to the patient's satisfaction.     [place], [place]

## 2018-07-24 NOTE — Anesthesia Preprocedure Evaluation (Addendum)
Anesthesia Evaluation  Patient identified by MRN, date of birth, ID band Patient awake    Reviewed: Allergy & Precautions, H&P , NPO status , Patient's Chart, lab work & pertinent test results  History of Anesthesia Complications (+) history of anesthetic complications ("woke up" during colonoscopy in 2004)  Airway Mallampati: III  TM Distance: >3 FB Neck ROM: full    Dental  (+) Teeth Intact   Pulmonary neg pulmonary ROS, COPD (gets bronchitis every winter, no problems recently),    breath sounds clear to auscultation       Cardiovascular hypertension, + Peripheral Vascular Disease  negative cardio ROS   Rhythm:regular Rate:Normal     Neuro/Psych PSYCHIATRIC DISORDERS Anxiety Depression TIA (2015, no issues since) Neuromuscular disease negative neurological ROS  negative psych ROS   GI/Hepatic negative GI ROS, Neg liver ROS,   Endo/Other  negative endocrine ROS  Renal/GU Renal diseasenegative Renal ROS  negative genitourinary   Musculoskeletal  (+) Fibromyalgia -  Abdominal   Peds  Hematology negative hematology ROS (+) anemia ,   Anesthesia Other Findings Past Medical History: No date: Anemia No date: Anxiety No date: Chronic fatigue 05/31/2016: Chronic insomnia No date: Depression No date: Fibromyalgia No date: Hypertension No date: Insomnia No date: Panic attack No date: Stroke [facility](HCC) 05/31/2016: Vitamin D deficiency  Past Surgical History: No date: ABDOMINAL HYSTERECTOMY No date: PARTIAL HYSTERECTOMY  BMI    Body Mass Index:  31.78 kg/m      Reproductive/Obstetrics negative OB ROS                            Anesthesia Physical Anesthesia Plan  ASA: III  Anesthesia Plan: General   Post-op Pain Management:    Induction:   PONV Risk Score and Plan: Propofol infusion and TIVA  Airway Management Planned:   Additional Equipment:   Intra-op Plan:    Post-operative Plan:   Informed Consent: I have reviewed the patients History and Physical, chart, labs and discussed the procedure including the risks, benefits and alternatives for the proposed anesthesia with the patient or authorized representative who has indicated his/her understanding and acceptance.   Dental Advisory Given  Plan Discussed with: Anesthesiologist, CRNA and Surgeon  Anesthesia Plan Comments:        Anesthesia Quick Evaluation

## 2018-07-24 NOTE — Op Note (Signed)
[facility] Patient Name: Victoria Guzman Procedure Date: 07/24/2018 1:21 PM MRN: [id number] Account #: [id number] Date of Birth: [birth year] Admit Type: Outpatient Age: 52 Room: [facility] ENDO ROOM 2 Gender: Female Note Status: Finalized Procedure:            Colonoscopy Indications:          Screening for colorectal malignant neoplasm Providers:            Boykin Nearing. Norma Fredrickson MD, MD Referring MD:         Gracelyn Nurse, MD (Referring MD) Medicines:            Propofol per Anesthesia Complications:        No immediate complications. Procedure:            Pre-Anesthesia Assessment:                       - Prior to the procedure, a History and Physical was                        performed, and patient medications, allergies and                        sensitivities were reviewed. The patient's tolerance of                        previous anesthesia was reviewed.                       - The risks and benefits of the procedure and the                        sedation options and risks were discussed with the                        patient. All questions were answered and informed                        consent was obtained.                       - Patient identification and proposed procedure were                        verified prior to the procedure by the nurse. The                        procedure was verified in the procedure room.                       - ASA Grade Assessment: III - A patient with severe                        systemic disease.                       - After reviewing the risks and benefits, the patient                        was deemed in satisfactory condition to undergo the  procedure.                       After obtaining informed consent, the colonoscope was                        passed under direct vision. Throughout the procedure,                        the patient's blood pressure, pulse, and oxygen                    saturations were monitored continuously. The                        Colonoscope was introduced through the anus and                        advanced to the the cecum, identified by appendiceal                        orifice and ileocecal valve. The colonoscopy was                        performed without difficulty. The patient tolerated the                        procedure well. Findings:      The perianal and digital rectal examinations were normal. Pertinent       negatives include normal sphincter tone and no palpable rectal lesions.      The colon (entire examined portion) appeared normal.      Non-bleeding internal hemorrhoids were found during retroflexion. The       hemorrhoids were Grade I (internal hemorrhoids that do not prolapse).      The exam was otherwise without abnormality. Impression:           - The entire examined colon is normal.                       - Non-bleeding internal hemorrhoids.                       - The examination was otherwise normal.                       - No specimens collected. Recommendation:       - Patient has a contact number available for                        emergencies. The signs and symptoms of potential                        delayed complications were discussed with the patient.                        Return to normal activities tomorrow. Written discharge                        instructions were provided to the patient.                       - Resume previous  diet.                       - Continue present medications.                       - Monitor results to esophageal dilation                       - Return to physician assistant in 3 months.                       - The findings and recommendations were discussed with                        the patient and their family. Procedure Code(s):    --- Professional ---                       [id number], Colorectal cancer screening; colonoscopy on                        individual  not meeting criteria for high risk Diagnosis Code(s):    --- Professional ---                       K64.0, First degree hemorrhoids                       Z12.11, Encounter for screening for malignant neoplasm                        of colon CPT copyright 2017 American Medical Association. All rights reserved. The codes documented in this report are preliminary and upon coder review may  be revised to meet current compliance requirements. Stanton Kidneyeodoro K Toledo MD, MD 07/24/2018 2:13:56 PM This report has been signed electronically. Number of Addenda: 0 Note Initiated On: 07/24/2018 1:21 PM Scope Withdrawal Time: 0 hours 7 minutes 6 seconds  Total Procedure Duration: 0 hours 11 minutes 6 seconds       [facility]

## 2018-07-24 NOTE — H&P (Signed)
Outpatient short stay form Pre-procedure 07/24/2018 1:13 PM Victoria Guzman, M.D.  Primary Physician: Victoria Guzman, M.D.  Reason for visit: Esophageal dysphagia, Colon cancer screening.  History of present illness: Patient has occasional solid food dysphagia to the level of the suprasternal notch, usually with roast beef. Denies hemetemesis, abdominal pain or change in bowel habits.     Current Facility-Administered Medications:  .  0.9 %  sodium chloride infusion, , Intravenous, Continuous, Victoria Guzman, Victoria Nearingeodoro K, MD, Last Rate: 20 mL/hr at 07/24/18 1311 .  ipratropium-albuterol (DUONEB) 0.5-2.5 (3) MG/3ML nebulizer solution 3 mL, 3 mL, Nebulization, Once, Victoria Guzman, Victoria MccreedyJoseph K, MD .  ipratropium-albuterol (DUONEB) 0.5-2.5 (3) MG/3ML nebulizer solution, , , ,   Medications Prior to Admission  Medication Sig Dispense Refill Last Dose  . Cholecalciferol 2000 units CAPS Take 2,000 Units by mouth daily.   07/23/2018 at Unknown time  . clonazePAM (KLONOPIN) 1 MG tablet Take 1 tablet (1 mg total) by mouth 2 (two) times daily. 60 tablet 0 07/23/2018 at Unknown time  . folic acid (FOLVITE) 1 MG tablet Take 1 mg by mouth daily.   Past Week at Unknown time  . lisinopril-hydrochlorothiazide (PRINZIDE,ZESTORETIC) 10-12.5 MG tablet Take 1 tablet by mouth 2 (two) times daily.   07/24/2018 at 0800  . oxyCODONE-acetaminophen (PERCOCET/ROXICET) 5-325 MG tablet Take 1 tablet by mouth every 8 (eight) hours as needed for moderate pain. 9 tablet 0 Past Week at Unknown time  . tiZANidine (ZANAFLEX) 4 MG capsule Take 1 capsule (4 mg total) by mouth 3 (three) times daily. 90 capsule 0 07/23/2018 at Unknown time  . zolpidem (AMBIEN) 5 MG tablet Take 1 tablet (5 mg total) by mouth at bedtime as needed for sleep. 5 tablet 0 07/23/2018 at Unknown time  . albuterol (PROAIR HFA) 108 (90 BASE) MCG/ACT inhaler Inhale 2 puffs into the lungs as needed. 1 Inhaler 5 Past Week at prn  . albuterol (PROVENTIL HFA;VENTOLIN HFA) 108 (90  Base) MCG/ACT inhaler Inhale 2 puffs into the lungs every 4 (four) hours as needed for wheezing or shortness of breath. (Patient not taking: Reported on 04/22/2018) 1 Inhaler 1 Not Taking at Unknown time  . aspirin ([employer] ASPIRIN LOW DOSE) 81 MG EC tablet Take 1 tablet (81 mg total) by mouth daily. (Patient not taking: Reported on 04/23/2018) 30 tablet 8 Not Taking at Unknown time  . chlorpheniramine-HYDROcodone (TUSSIONEX PENNKINETIC ER) 10-8 MG/5ML SUER Take 5 mLs by mouth every 12 (twelve) hours as needed. (Patient not taking: Reported on 04/22/2018) 115 mL 0 Not Taking at Unknown time  . chlorpheniramine-HYDROcodone (TUSSIONEX PENNKINETIC ER) 10-8 MG/5ML SUER Take 5 mLs by mouth every 12 (twelve) hours as needed for cough. (Patient not taking: Reported on 04/22/2018) 115 mL 0 Not Taking at Unknown time  . escitalopram (LEXAPRO) 10 MG tablet Take 1 tablet by mouth daily.  5 Not Taking at Unknown time  . estradiol (ESTRACE VAGINAL) 0.1 MG/GM vaginal cream Place 1 Applicatorful vaginally at bedtime. (Patient not taking: Reported on 04/22/2018) 42.5 g 1 Not Taking at Unknown time  . ketoconazole (NIZORAL) 2 % cream Apply 1 application topically 2 (two) times daily. (Patient not taking: Reported on 04/22/2018) 60 g 3 Not Taking at Unknown time  . predniSONE (DELTASONE) 50 MG tablet Take 1 tablet (50 mg total) by mouth daily. (Patient not taking: Reported on 07/24/2018) 3 tablet 0 Not Taking at Unknown time  . terconazole (TERAZOL 3) 80 MG vaginal suppository Place 1 suppository (80 mg total) vaginally at bedtime. (  Patient not taking: Reported on 04/22/2018) 3 suppository 0 Not Taking at Unknown time  . tiZANidine (ZANAFLEX) 4 MG tablet Take 4 mg by mouth 3 (three) times daily.   Past Week at Unknown time     Allergies  Allergen Reactions  . Clonidine Derivatives Hives  . Quinolones     Other reaction(s): Unknown  . Zofran [Ondansetron Hcl]     Itch, and swelling of lip  . Moxifloxacin Rash     Past  Medical History:  Diagnosis Date  . Anemia   . Anxiety   . Chronic fatigue   . Chronic insomnia 05/31/2016  . Depression   . Fibromyalgia   . Hypertension   . Insomnia   . Panic attack   . Stroke (HCC)   . Vitamin D deficiency 05/31/2016    Review of systems:  Otherwise negative.    Physical Exam  Gen: Alert, oriented. Appears stated age.  HEENT: [place]/AT. PERRLA. Lungs: CTA, no wheezes. CV: RR nl S1, S2. Abd: soft, benign, no masses. BS+ Ext: No edema. Pulses 2+    Planned procedures: Proceed with EGD and colonoscopy. The patient understands the nature of the planned procedure, indications, risks, alternatives and potential complications including but not limited to bleeding, infection, perforation, damage to internal organs and possible oversedation/side effects from anesthesia. The patient agrees and gives consent to proceed.  Please refer to procedure notes for findings, recommendations and patient disposition/instructions.     Victoria Guzman Guzman. Victoria Guzman, M.D. Gastroenterology 07/24/2018  1:13 PM

## 2018-07-24 NOTE — Transfer of Care (Signed)
Immediate Anesthesia Transfer of Care Note  Patient: [name]  Procedure(s) Performed: ESOPHAGOGASTRODUODENOSCOPY (EGD) WITH PROPOFOL (N/A ) COLONOSCOPY WITH PROPOFOL (N/A )  Patient Location: Endoscopy Unit  Anesthesia Type:General  Level of Consciousness: awake, alert  and oriented  Airway & Oxygen Therapy: Patient Spontanous Breathing  Post-op Assessment: Report given to RN and Post -op Vital signs reviewed and stable  Post vital signs: Reviewed and stable  Last Vitals:  Vitals Value Taken Time  BP    Temp    Pulse    Resp    SpO2      Last Pain:  Vitals:   07/24/18 1248  TempSrc: Tympanic  PainSc: 7          Complications: No apparent anesthesia complications

## 2018-07-24 NOTE — Anesthesia Postprocedure Evaluation (Signed)
Anesthesia Post Note  Patient: Charna BusmanMelissa L Hodapp  Procedure(s) Performed: ESOPHAGOGASTRODUODENOSCOPY (EGD) WITH PROPOFOL (N/A ) COLONOSCOPY WITH PROPOFOL (N/A )  Patient location during evaluation: PACU Anesthesia Type: General Level of consciousness: awake and alert Pain management: pain level controlled Vital Signs Assessment: post-procedure vital signs reviewed and stable Respiratory status: spontaneous breathing, nonlabored ventilation and respiratory function stable Cardiovascular status: blood pressure returned to baseline and stable Postop Assessment: no apparent nausea or vomiting Anesthetic complications: no     Last Vitals:  Vitals:   07/24/18 1420 07/24/18 1430  BP: (!) 152/90 (!) 156/87  Pulse: 76 75  Resp: 20 13  Temp:    SpO2: 100% 100%    Last Pain:  Vitals:   07/24/18 1430  TempSrc: Tympanic  PainSc:                  Jovita GammaKathryn L Fitzgerald

## 2018-07-24 NOTE — Anesthesia Post-op Follow-up Note (Signed)
Anesthesia QCDR form completed.        

## 2018-07-25 ENCOUNTER — Encounter: Payer: Self-pay | Admitting: Internal Medicine

## 2018-09-11 ENCOUNTER — Other Ambulatory Visit: Payer: Medicare Other

## 2018-09-24 NOTE — H&P (Signed)
[name] is a 52 y.o. female here forL/S cervical trachelectomy for cervical dysplasia being she is s/p a [facility] 2008 . Pt is s/p a [facility] 10/16/07  Recent pap 06/2018 show + LGSIL and + HR HPV .  ecc: in office negative 07/05/2018  Past Medical History:  has a past medical history of Anemia, unspecified, Anxiety, Chronic fatigue, unspecified, Depression, Fibromyalgia, HTN (hypertension), Insomnia, and TIA due to embolism (CMS-HCC).  Past Surgical History:  has a past surgical history that includes Energy and Hysterectomy Vaginal. Family History: family history includes Diabetes in her mother; High blood pressure (Hypertension) in her mother; Kidney failure in her mother; Multiple myeloma in her father. Social History:  reports that she has never smoked. She has never used smokeless tobacco. She reports that she drinks about 0.6 oz of alcohol per week. OB/GYN History:          OB History    Gravida  2   Para  2   Term      Preterm      AB      Living  2     SAB      TAB      Ectopic      Molar      Multiple      Live Births  2          Allergies: is allergic to avelox [moxifloxacin] and ondansetron hcl. Medications:  Current Outpatient Medications:  .  albuterol (PROVENTIL HFA) 90 mcg/actuation inhaler, Inhale 2 inhalations into the lungs every 4 (four) hours as needed, Disp: , Rfl:  .  cholecalciferol (VITAMIN D3) 2,000 unit tablet, Take 1 tablet (2,000 Units total) by mouth once daily, Disp: 30 tablet, Rfl: 2 .  clonazePAM (KLONOPIN) 1 MG tablet, Take 1 tablet (1 mg total) by mouth 2 (two) times daily, Disp: 60 tablet, Rfl: 1 .  escitalopram oxalate (LEXAPRO) 10 MG tablet, TAKE 1 TABLET BY MOUTH DAILY FOR 30 DAY(S), INSTR:DEPRESSION/ANXIETY, Disp: , Rfl: 5 .  fluticasone propionate (FLONASE) 50 mcg/actuation nasal spray, Place 2 sprays into both nostrils once daily, Disp: 16 g, Rfl: 2 .  folic acid (FOLVITE) 1 MG tablet, Take 1 mg by mouth once daily, Disp: ,  Rfl:  .  lisinopril-hydrochlorothiazide (PRINZIDE,ZESTORETIC) 20-12.5 mg tablet, Take 1 tablet by mouth once daily, Disp: 30 tablet, Rfl: 1 .  omega-3/dha/epa/fish oil (OMEGA-3 FISH OIL ORAL), Take by mouth, Disp: , Rfl:  .  oxyCODONE-acetaminophen (PERCOCET) 5-325 mg tablet, Take 1 tablet by mouth every 8 (eight) hours as needed, Disp: 30 tablet, Rfl: 0 .  predniSONE (DELTASONE) 5 MG tablet, 6 po day one decrease by 57m qd til gone., Disp: 21 tablet, Rfl: 0 .  tiZANidine (ZANAFLEX) 4 MG tablet, Take 1 tablet (4 mg total) by mouth 3 (three) times daily, Disp: 90 tablet, Rfl: 1 .  valACYclovir (VALTREX) 1000 MG tablet, 1 TABLET ORAL TWICE A DAY FOR 7 DAY(S) AS NEEDED, Disp: , Rfl: 3 .  ergocalciferol, vitamin D2, 50,000 unit capsule, Take 1 capsule (50,000 Units total) by mouth once a week After this prescription take Vitamin D 2000 units daily. (Patient not taking: Reported on 06/27/2018 ), Disp: 4 capsule, Rfl: 0 .  peg-electrolyte (NULYTELY) solution, Take 4,000 mLs by mouth as directed USE AS DIRECTED FOR COLONOSCOPY (Patient not taking: Reported on 07/05/2018 ), Disp: 4000 mL, Rfl: 0 .  zolpidem (AMBIEN) 10 mg tablet, Take 1 tablet (10 mg total) by mouth nightly as needed for  Sleep, Disp: 30 tablet, Rfl: 1  Review of Systems: General:                      No fatigue or weight loss Eyes:                           No vision changes Ears:                            No hearing difficulty Respiratory:                No cough or shortness of breath Pulmonary:                  No asthma or shortness of breath Cardiovascular:           No chest pain, palpitations, dyspnea on exertion Gastrointestinal:          No abdominal bloating, chronic diarrhea, constipations, masses, pain or hematochezia Genitourinary:             No hematuria, dysuria, abnormal vaginal discharge, pelvic pain, Menometrorrhagia Lymphatic:                   No swollen lymph nodes Musculoskeletal:         No muscle  weakness Neurologic:                  No extremity weakness, syncope, seizure disorder Psychiatric:                  No history of depression, delusions or suicidal/homicidal ideation    Exam:      Vitals:   09/25/18  0915  BP: 98/66  Pulse: 105    Body mass index is 31.31 kg/m.  WDWN black female in NAD   Lungs: CTA  CV : RRR without murmur    Neck:  no thyromegaly Abdomen: soft , no mass, normal active bowel sounds,  non-tender, no rebound tenderness Pelvic: tanner stage 5 ,  External genitalia: vulva /labia no lesions Urethra: no prolapse Vagina: normal physiologic d/c, adequate room for TVH  Cervix: no lesions, no cervical motion tenderness   Uterus: absent   non-tender Adnexa: no mass,  non-tender   Rectovaginal:    after discussion we have elected to perform a colposcopic exam   Colposcopic exam: A speculum is placed and 5% acetic acid is applied to the cervix. There is no  acetowhite epithelium . There is no puncation .  There is no mosacism . Transformation zone is not visualized  Due to a stenotic cervix     An ECC is also done with a cytobrush ?? Sample obtained . Pt tolerated the procedure poorly .  Impression:   The primary encounter diagnosis was Pap smear abnormality of cervix with LGSIL. 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  Inadequate colposcopic exam   Plan:   Spoke to the pt and her sister regarding options for treatment , either a Cx LEEP in OR ( poor pt tolerance ) or Laparoscopic assisted cervical trachelectomy and bilateral salpingectomy  after discussion the patient has elected for L/S cervical trachelectomy and bilateral salpingectomy    risks of the procedure have been discussed .    Caroline Sauger, MD       Electronically signed by Laverta Baltimore  Cira Rue, MD

## 2018-09-25 ENCOUNTER — Inpatient Hospital Stay: Admission: RE | Admit: 2018-09-25 | Payer: Medicare Other | Source: Ambulatory Visit

## 2018-09-26 NOTE — H&P (Signed)
Victoria Guzman is a 52 y.o. female here L/S cervical trachelectomy  . Pt is s/p a [facility] 10/16/07  Recent pap 06/2018 show + LGSIL and + HR HPV .   colposcopic eval with inadequate colposcopy given TZ not fully evaluated given stenotic cx  ecc negative   Past Medical History:  has a past medical history of Anemia, unspecified, Anxiety, Chronic fatigue, unspecified, Depression, Fibromyalgia, HTN (hypertension), Insomnia, and TIA due to embolism (CMS-HCC).  Past Surgical History:  has a past surgical history that includes [place] and Hysterectomy Vaginal. Family History: family history includes Diabetes in her mother; High blood pressure (Hypertension) in her mother; Kidney failure in her mother; Multiple myeloma in her father. Social History:  reports that she has never smoked. She has never used smokeless tobacco. She reports that she drinks about 0.6 oz of alcohol per week. OB/GYN History:          OB History    Gravida  2   Para  2   Term      Preterm      AB      Living  2     SAB      TAB      Ectopic      Molar      Multiple      Live Births  2          Allergies: is allergic to avelox [moxifloxacin] and ondansetron hcl. Medications:  Current Outpatient Medications:  .  albuterol (PROVENTIL HFA) 90 mcg/actuation inhaler, Inhale 2 inhalations into the lungs every 4 (four) hours as needed, Disp: , Rfl:  .  cholecalciferol (VITAMIN D3) 2,000 unit tablet, Take 1 tablet (2,000 Units total) by mouth once daily, Disp: 30 tablet, Rfl: 2 .  clonazePAM (KLONOPIN) 1 MG tablet, Take 1 tablet (1 mg total) by mouth 2 (two) times daily, Disp: 60 tablet, Rfl: 1 .  escitalopram oxalate (LEXAPRO) 10 MG tablet, TAKE 1 TABLET BY MOUTH DAILY FOR 30 DAY(S), INSTR:DEPRESSION/ANXIETY, Disp: , Rfl: 5 .  fluticasone propionate (FLONASE) 50 mcg/actuation nasal spray, Place 2 sprays into both nostrils once daily, Disp: 16 g, Rfl: 2 .  folic acid (FOLVITE) 1 MG tablet, Take 1 mg by mouth  once daily, Disp: , Rfl:  .  lisinopril-hydrochlorothiazide (PRINZIDE,ZESTORETIC) 20-12.5 mg tablet, Take 1 tablet by mouth once daily, Disp: 30 tablet, Rfl: 1 .  omega-3/dha/epa/fish oil (OMEGA-3 FISH OIL ORAL), Take by mouth, Disp: , Rfl:  .  oxyCODONE-acetaminophen (PERCOCET) 5-325 mg tablet, Take 1 tablet by mouth every 8 (eight) hours as needed, Disp: 30 tablet, Rfl: 0 .  predniSONE (DELTASONE) 5 MG tablet, 6 po day one decrease by 55m qd til gone., Disp: 21 tablet, Rfl: 0 .  tiZANidine (ZANAFLEX) 4 MG tablet, Take 1 tablet (4 mg total) by mouth 3 (three) times daily, Disp: 90 tablet, Rfl: 1 .  valACYclovir (VALTREX) 1000 MG tablet, 1 TABLET ORAL TWICE A DAY FOR 7 DAY(S) AS NEEDED, Disp: , Rfl: 3 .  ergocalciferol, vitamin D2, 50,000 unit capsule, Take 1 capsule (50,000 Units total) by mouth once a week After this prescription take Vitamin D 2000 units daily. (Patient not taking: Reported on 06/27/2018 ), Disp: 4 capsule, Rfl: 0 .  peg-electrolyte (NULYTELY) solution, Take 4,000 mLs by mouth as directed USE AS DIRECTED FOR COLONOSCOPY (Patient not taking: Reported on 07/05/2018 ), Disp: 4000 mL, Rfl: 0 .  zolpidem (AMBIEN) 10 mg tablet, Take 1 tablet (10 mg total) by mouth  nightly as needed for Sleep, Disp: 30 tablet, Rfl: 1  Review of Systems: General:                      No fatigue or weight loss Eyes:                           No vision changes Ears:                            No hearing difficulty Respiratory:                No cough or shortness of breath Pulmonary:                  No asthma or shortness of breath Cardiovascular:           No chest pain, palpitations, dyspnea on exertion Gastrointestinal:          No abdominal bloating, chronic diarrhea, constipations, masses, pain or hematochezia Genitourinary:             No hematuria, dysuria, abnormal vaginal discharge, pelvic pain, Menometrorrhagia Lymphatic:                   No swollen lymph nodes Musculoskeletal:         No  muscle weakness Neurologic:                  No extremity weakness, syncope, seizure disorder Psychiatric:                  No history of depression, delusions or suicidal/homicidal ideation    Exam:      Vitals:   07/05/18 0915  BP: 98/66  Pulse: 105    Body mass index is 31.31 kg/m.  WDWN black female in NAD   Lungs: CTA  CV : RRR without murmur    Neck:  no thyromegaly Abdomen: soft , no mass, normal active bowel sounds,  non-tender, no rebound tenderness Pelvic: tanner stage 5 ,  External genitalia: vulva /labia no lesions Urethra: no prolapse Vagina: normal physiologic d/c, adequate room for TVH  Cervix: no lesions, no cervical motion tenderness   Uterus: absent   non-tender Adnexa: no mass,  non-tender   Rectovaginal:     Impression:   The primary encounter diagnosis was Pap smear abnormality of cervix with LGSIL.  Inadequate colposcopic exam   Plan:   Spoke to the pt and her sister regarding options for treatment , either a Cx LEEP in OR ( poor pt tolerance ) or Laparoscopic assisted cervical trachelectomy and bilateral salpingectomy. She opted for the latter . The risks of the procedure have been discussed . All questions answered       Caroline Sauger, MD

## 2018-09-27 ENCOUNTER — Other Ambulatory Visit: Payer: Medicare Other

## 2018-09-28 ENCOUNTER — Other Ambulatory Visit: Payer: Medicare Other

## 2018-10-01 ENCOUNTER — Ambulatory Visit: Admit: 2018-10-01 | Payer: Medicare Other | Admitting: Obstetrics and Gynecology

## 2018-10-01 SURGERY — TRACHELECTOMY
Anesthesia: General

## 2018-12-18 ENCOUNTER — Other Ambulatory Visit: Payer: Medicare Other

## 2019-01-15 ENCOUNTER — Encounter
Admission: RE | Admit: 2019-01-15 | Discharge: 2019-01-15 | Disposition: A | Discharge status: Home / Self Care | Payer: Medicare Other | Source: Ambulatory Visit | Attending: Obstetrics and Gynecology | Admitting: Obstetrics and Gynecology

## 2019-01-15 ENCOUNTER — Other Ambulatory Visit: Payer: Self-pay

## 2019-01-15 DIAGNOSIS — Z01818 Encounter for other preprocedural examination: Secondary | ICD-10-CM | POA: Insufficient documentation

## 2019-01-15 DIAGNOSIS — I10 Essential (primary) hypertension: Secondary | ICD-10-CM | POA: Insufficient documentation

## 2019-01-15 HISTORY — DX: Family history of other specified conditions: Z84.89

## 2019-01-15 LAB — BASIC METABOLIC PANEL
Anion gap: 6 (ref 5–15)
BUN: 18 mg/dL (ref 6–20)
CO2: 27 mmol/L (ref 22–32)
Calcium: 8.8 mg/dL — ABNORMAL LOW (ref 8.9–10.3)
Chloride: 102 mmol/L (ref 98–111)
Creatinine, Ser: 1.2 mg/dL — ABNORMAL HIGH (ref 0.44–1.00)
GFR calc Af Amer: >60 mL/min (ref >60)
GFR calc non Af Amer: 53 mL/min — ABNORMAL LOW (ref >60)
Glucose, Bld: 86 mg/dL (ref 70–99)
Potassium: 3.3 mmol/L — ABNORMAL LOW (ref 3.5–5.1)
Sodium: 135 mmol/L (ref 135–145)

## 2019-01-15 LAB — CBC
HCT: 31.9 % — ABNORMAL LOW (ref 36.0–46.0)
Hemoglobin: 10.2 g/dL — ABNORMAL LOW (ref 12.0–15.0)
MCH: 25.5 pg — ABNORMAL LOW (ref 26.0–34.0)
MCHC: 32 g/dL (ref 30.0–36.0)
MCV: 79.8 fL — ABNORMAL LOW (ref 80.0–100.0)
Platelets: 350 10*3/uL (ref 150–400)
RBC: 4 MIL/uL (ref 3.87–5.11)
RDW: 15.6 % — ABNORMAL HIGH (ref 11.5–15.5)
WBC: 8.2 10*3/uL (ref 4.0–10.5)
nRBC: 0 % (ref 0.0–0.2)

## 2019-01-15 LAB — TYPE AND SCREEN
ABO/RH(D): O POS
Antibody Screen: NEGATIVE

## 2019-01-15 NOTE — Patient Instructions (Signed)
Your procedure is scheduled on: Friday 01/25/2019 Report to DAY SURGERY DEPARTMENT LOCATED ON 2ND FLOOR MEDICAL MALL ENTRANCE. To find out your arrival time please call [phone] between 1PM - 3PM on Thursday 01/24/2019.  Remember: Instructions that are not followed completely may result in serious medical risk, up to and including death, or upon the discretion of your surgeon and anesthesiologist your surgery may need to be rescheduled.     _X__ 1. Do not eat food after midnight the night before your procedure.                 No gum chewing or hard candies. You may drink clear liquids up to 2 hours                 before you are scheduled to arrive for your surgery- DO not drink clear                 liquids within 2 hours of the start of your surgery.                 Clear Liquids include:  water, apple juice without pulp, clear carbohydrate                 drink such as Clearfast or Gatorade, Black Coffee or Tea (Do not add                 anything to coffee or tea). Drink Ensure Pre-Surgery no later than 2 hours before arrival.  __X__2.  On the morning of surgery brush your teeth with toothpaste and water, you                 may rinse your mouth with mouthwash if you wish.  Do not swallow any              toothpaste of mouthwash.     _X__ 3.  No Alcohol for 24 hours before or after surgery.   _X__ 4.  Do Not Smoke or use e-cigarettes For 24 Hours Prior to Your Surgery.                 Do not use any chewable tobacco products for at least 6 hours prior to                 surgery.  ____  5.  Bring all medications with you on the day of surgery if instructed.   __X__  6.  Notify your doctor if there is any change in your medical condition      (cold, fever, infections).     Do not wear jewelry, make-up, hairpins, clips or nail polish. Do not wear lotions, powders, or perfumes.  Do not shave 48 hours prior to surgery. Men may shave face and neck. Do not bring valuables to the  hospital.    [employer] is not responsible for any belongings or valuables.  Contacts, dentures/partials or body piercings may not be worn into surgery. Bring a case for your contacts, glasses or hearing aids, a denture cup will be supplied. Leave your suitcase in the car. After surgery it may be brought to your room. For patients admitted to the hospital, discharge time is determined by your treatment team.   Patients discharged the day of surgery will not be allowed to drive home.   Please read over the following fact sheets that you were given:   MRSA Information  __X__ Take these medicines the morning  of surgery with A SIP OF WATER:    1. Oxycodone if needed  2. Valtrex if needed  3.   4.  5.  6.  ____ Fleet Enema (as directed)   __X__ Use CHG Soap/SAGE wipes as directed  __X__ Use inhalers on the day of surgery  ____ Stop metformin/Janumet/Farxiga 2 days prior to surgery    ____ Take 1/2 of usual insulin dose the night before surgery. No insulin the morning          of surgery.   ____ Stop Blood Thinners Coumadin/Plavix/Xarelto/Pleta/Pradaxa/Eliquis/Effient/Aspirin  on   Or contact your Surgeon, Cardiologist or Medical Doctor regarding  ability to stop your blood thinners  __X__ Stop Anti-inflammatories 7 days before surgery such as Advil, Ibuprofen, Motrin,  BC or Goodies Powder, Naprosyn, Naproxen, Aleve, Aspirin     __X__ Stop all herbal supplements, fish oil or vitamin E until after surgery.    ____ Bring C-Pap to the hospital.

## 2019-01-15 NOTE — H&P (Signed)
Ms. Olheiser is a 53 y.o. female here for L?S Pt is s/p a Laparoscopic cervical trachelectomy and bilateral salpingectomy   Recent pap 06/2018 show + LGSIL and + HR HPV .  Inadequate colposcopic evaluation of the cervix   Pt has elected for cervix removal    Past Medical History:  has a past medical history of Anemia, unspecified, Anxiety, Chronic fatigue, unspecified, Depression, Fibromyalgia, HTN (hypertension), Insomnia, and TIA due to embolism (CMS-HCC).  Past Surgical History:  has a past surgical history that includes [place] and  Family History: family history includes Diabetes in her mother; High blood pressure (Hypertension) in her mother; Kidney failure in her mother; Multiple myeloma in her father. Social History:  reports that she has never smoked. She has never used smokeless tobacco. She reports that she drinks about 0.6 oz of alcohol per week. OB/GYN History:          OB History    Gravida  2   Para  2   Term      Preterm      AB      Living  2     SAB      TAB      Ectopic      Molar      Multiple      Live Births  2          Allergies: is allergic to avelox [moxifloxacin] and ondansetron hcl. Medications:  Current Outpatient Medications:  .  albuterol (PROVENTIL HFA) 90 mcg/actuation inhaler, Inhale 2 inhalations into the lungs every 4 (four) hours as needed, Disp: , Rfl:  .  cholecalciferol (VITAMIN D3) 2,000 unit tablet, Take 1 tablet (2,000 Units total) by mouth once daily, Disp: 30 tablet, Rfl: 2 .  clonazePAM (KLONOPIN) 1 MG tablet, Take 1 tablet (1 mg total) by mouth 2 (two) times daily, Disp: 60 tablet, Rfl: 1 .  escitalopram oxalate (LEXAPRO) 10 MG tablet, TAKE 1 TABLET BY MOUTH DAILY FOR 30 DAY(S), INSTR:DEPRESSION/ANXIETY, Disp: , Rfl: 5 .  fluticasone propionate (FLONASE) 50 mcg/actuation nasal spray, Place 2 sprays into both nostrils once daily, Disp: 16 g, Rfl: 2 .  folic acid (FOLVITE) 1 MG tablet, Take 1 mg by mouth once daily,  Disp: , Rfl:  .  lisinopril-hydrochlorothiazide (PRINZIDE,ZESTORETIC) 20-12.5 mg tablet, Take 1 tablet by mouth once daily, Disp: 30 tablet, Rfl: 1 .  omega-3/dha/epa/fish oil (OMEGA-3 FISH OIL ORAL), Take by mouth, Disp: , Rfl:  .  oxyCODONE-acetaminophen (PERCOCET) 5-325 mg tablet, Take 1 tablet by mouth every 8 (eight) hours as needed, Disp: 30 tablet, Rfl: 0 .  predniSONE (DELTASONE) 5 MG tablet, 6 po day one decrease by 63m qd til gone., Disp: 21 tablet, Rfl: 0 .  tiZANidine (ZANAFLEX) 4 MG tablet, Take 1 tablet (4 mg total) by mouth 3 (three) times daily, Disp: 90 tablet, Rfl: 1 .  valACYclovir (VALTREX) 1000 MG tablet, 1 TABLET ORAL TWICE A DAY FOR 7 DAY(S) AS NEEDED, Disp: , Rfl: 3 .  ergocalciferol, vitamin D2, 50,000 unit capsule, Take 1 capsule (50,000 Units total) by mouth once a week After this prescription take Vitamin D 2000 units daily. (Patient not taking: Reported on 06/27/2018 ), Disp: 4 capsule, Rfl: 0 .  peg-electrolyte (NULYTELY) solution, Take 4,000 mLs by mouth as directed USE AS DIRECTED FOR COLONOSCOPY (Patient not taking: Reported on 07/05/2018 ), Disp: 4000 mL, Rfl: 0 .  zolpidem (AMBIEN) 10 mg tablet, Take 1 tablet (10 mg total) by mouth nightly  as needed for Sleep, Disp: 30 tablet, Rfl: 1  Review of Systems: General:                      No fatigue or weight loss Eyes:                           No vision changes Ears:                            No hearing difficulty Respiratory:                No cough or shortness of breath Pulmonary:                  No asthma or shortness of breath Cardiovascular:           No chest pain, palpitations, dyspnea on exertion Gastrointestinal:          No abdominal bloating, chronic diarrhea, constipations, masses, pain or hematochezia Genitourinary:             No hematuria, dysuria, abnormal vaginal discharge, pelvic pain, Menometrorrhagia Lymphatic:                   No swollen lymph nodes Musculoskeletal:         No muscle  weakness Neurologic:                  No extremity weakness, syncope, seizure disorder Psychiatric:                  No history of depression, delusions or suicidal/homicidal ideation    Exam:      Vitals:   01/15/2019 0915  BP: 145/87  Pulse: 105    Body mass index is 31.31 kg/m.  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 Pt tolerates office procedures poorly   Plan:   Spoke to the pt and her sister regarding options for treatment , either a Cx LEEP in OR ( poor pt tolerance ) or Laparoscopic assisted cervical trachelectomy and bilateral salpingectomy  SHe has elected for L/S cervical trachelectomy and bilateral salpingectomy        Caroline Sauger, MD       Electronically signed by Yamilet Mcfayden, Burman Blacksmith, MD

## 2019-01-16 NOTE — Pre-Procedure Instructions (Signed)
Met B and CBC results sent to Dr. Ouida Sills and Anesthesia for review.

## 2019-01-25 ENCOUNTER — Encounter
Admission: RE | Disposition: A | Discharge status: Home / Self Care | Payer: Self-pay | Source: Home / Self Care | Attending: Obstetrics and Gynecology

## 2019-01-25 ENCOUNTER — Other Ambulatory Visit: Payer: Self-pay

## 2019-01-25 ENCOUNTER — Encounter: Payer: Self-pay | Admitting: *Deleted

## 2019-01-25 ENCOUNTER — Ambulatory Visit: Payer: Medicare Other

## 2019-01-25 ENCOUNTER — Observation Stay
Admission: RE | Admit: 2019-01-25 | Discharge: 2019-01-26 | Disposition: A | Discharge status: Home / Self Care | Payer: Medicare Other | Attending: Obstetrics and Gynecology | Admitting: Obstetrics and Gynecology

## 2019-01-25 DIAGNOSIS — Z9889 Other specified postprocedural states: Secondary | ICD-10-CM

## 2019-01-25 DIAGNOSIS — Z8673 Personal history of transient ischemic attack (TIA), and cerebral infarction without residual deficits: Secondary | ICD-10-CM | POA: Insufficient documentation

## 2019-01-25 DIAGNOSIS — Z90711 Acquired absence of uterus with remaining cervical stump: Secondary | ICD-10-CM | POA: Insufficient documentation

## 2019-01-25 DIAGNOSIS — N879 Dysplasia of cervix uteri, unspecified: Secondary | ICD-10-CM | POA: Diagnosis not present

## 2019-01-25 DIAGNOSIS — I10 Essential (primary) hypertension: Secondary | ICD-10-CM | POA: Insufficient documentation

## 2019-01-25 DIAGNOSIS — M797 Fibromyalgia: Secondary | ICD-10-CM | POA: Diagnosis not present

## 2019-01-25 HISTORY — PX: LAPAROSCOPIC BILATERAL SALPINGECTOMY: SHX5889

## 2019-01-25 HISTORY — PX: TRACHELECTOMY: SHX6586

## 2019-01-25 LAB — POCT I-STAT 4, (NA,K, GLUC, HGB,HCT)
Glucose, Bld: 80 mg/dL (ref 70–99)
HCT: 35 % — ABNORMAL LOW (ref 36.0–46.0)
Hemoglobin: 11.9 g/dL — ABNORMAL LOW (ref 12.0–15.0)
Potassium: 3.1 mmol/L — ABNORMAL LOW (ref 3.5–5.1)
Sodium: 137 mmol/L (ref 135–145)

## 2019-01-25 LAB — ABO/RH: ABO/RH(D): O POS

## 2019-01-25 SURGERY — TRACHELECTOMY
Anesthesia: General

## 2019-01-25 MED ORDER — OXYCODONE HCL 5 MG PO TABS: 5.0000 mg | ORAL_TABLET | Freq: Once | ORAL | Status: DC | PRN

## 2019-01-25 MED ORDER — SUGAMMADEX SODIUM 200 MG/2ML IV SOLN
INTRAVENOUS | Status: AC
  Filled 2019-01-25: qty 2

## 2019-01-25 MED ORDER — PROPOFOL 10 MG/ML IV BOLUS
INTRAVENOUS | Status: AC
  Filled 2019-01-25: qty 20

## 2019-01-25 MED ORDER — GABAPENTIN 300 MG PO CAPS
300.0000 mg | ORAL_CAPSULE | ORAL | Status: AC
  Administered 2019-01-25: 300 mg via ORAL

## 2019-01-25 MED ORDER — OXYCODONE HCL 5 MG/5ML PO SOLN: 5.0000 mg | Freq: Once | ORAL | Status: DC | PRN

## 2019-01-25 MED ORDER — FENTANYL CITRATE (PF) 100 MCG/2ML IJ SOLN
INTRAMUSCULAR | Status: AC
  Filled 2019-01-25: qty 2

## 2019-01-25 MED ORDER — SODIUM CHLORIDE (PF) 0.9 % IJ SOLN
INTRAVENOUS | Status: DC | PRN
  Administered 2019-01-25: 5 mL

## 2019-01-25 MED ORDER — ONDANSETRON HCL 4 MG/2ML IJ SOLN
INTRAMUSCULAR | Status: DC | PRN
  Administered 2019-01-25: 4 mg via INTRAVENOUS

## 2019-01-25 MED ORDER — VASOPRESSIN 20 UNIT/ML IV SOLN
INTRAVENOUS | Status: AC
  Filled 2019-01-25: qty 1

## 2019-01-25 MED ORDER — ROCURONIUM BROMIDE 100 MG/10ML IV SOLN
INTRAVENOUS | Status: DC | PRN
  Administered 2019-01-25: 50 mg via INTRAVENOUS
  Administered 2019-01-25: 10 mg via INTRAVENOUS

## 2019-01-25 MED ORDER — ROCURONIUM BROMIDE 50 MG/5ML IV SOLN
INTRAVENOUS | Status: AC
  Filled 2019-01-25: qty 1

## 2019-01-25 MED ORDER — PHENYLEPHRINE HCL 10 MG/ML IJ SOLN
INTRAMUSCULAR | Status: DC | PRN
  Administered 2019-01-25 (×6): 50 ug via INTRAVENOUS

## 2019-01-25 MED ORDER — SUGAMMADEX SODIUM 200 MG/2ML IV SOLN
INTRAVENOUS | Status: DC | PRN
  Administered 2019-01-25: 200 mg via INTRAVENOUS

## 2019-01-25 MED ORDER — DIPHENHYDRAMINE HCL 50 MG/ML IJ SOLN
INTRAMUSCULAR | Status: DC | PRN
  Administered 2019-01-25: 12.5 mg via INTRAVENOUS

## 2019-01-25 MED ORDER — MIDAZOLAM HCL 2 MG/2ML IJ SOLN
INTRAMUSCULAR | Status: AC
  Filled 2019-01-25: qty 2

## 2019-01-25 MED ORDER — FENTANYL CITRATE (PF) 100 MCG/2ML IJ SOLN
25.0000 ug | INTRAMUSCULAR | Status: DC | PRN
  Administered 2019-01-25: 50 ug via INTRAVENOUS
  Administered 2019-01-25 (×2): 25 ug via INTRAVENOUS

## 2019-01-25 MED ORDER — BUPIVACAINE HCL (PF) 0.5 % IJ SOLN
INTRAMUSCULAR | Status: AC
  Filled 2019-01-25: qty 30

## 2019-01-25 MED ORDER — FAMOTIDINE 20 MG PO TABS
20.0000 mg | ORAL_TABLET | Freq: Once | ORAL | Status: AC
  Administered 2019-01-25: 20 mg via ORAL

## 2019-01-25 MED ORDER — CLONAZEPAM 0.25 MG PO TBDP
1.0000 mg | ORAL_TABLET | Freq: Two times a day (BID) | ORAL | Status: DC | PRN
  Administered 2019-01-25: 1 mg via ORAL
  Filled 2019-01-25: qty 4

## 2019-01-25 MED ORDER — MIDAZOLAM HCL 2 MG/2ML IJ SOLN
INTRAMUSCULAR | Status: DC | PRN
  Administered 2019-01-25 (×2): 2 mg via INTRAVENOUS

## 2019-01-25 MED ORDER — LIDOCAINE HCL (PF) 2 % IJ SOLN
INTRAMUSCULAR | Status: AC
  Filled 2019-01-25: qty 10

## 2019-01-25 MED ORDER — CELECOXIB 200 MG PO CAPS
400.0000 mg | ORAL_CAPSULE | ORAL | Status: AC
  Administered 2019-01-25: 400 mg via ORAL

## 2019-01-25 MED ORDER — IBUPROFEN 600 MG PO TABS
600.0000 mg | ORAL_TABLET | Freq: Four times a day (QID) | ORAL | Status: DC | PRN
  Administered 2019-01-25 – 2019-01-26 (×4): 600 mg via ORAL
  Filled 2019-01-25 (×4): qty 1

## 2019-01-25 MED ORDER — OXYCODONE-ACETAMINOPHEN 5-325 MG PO TABS
1.0000 | ORAL_TABLET | Freq: Four times a day (QID) | ORAL | Status: DC | PRN
  Administered 2019-01-25: 2 via ORAL
  Administered 2019-01-25 (×2): 1 via ORAL
  Administered 2019-01-26 (×2): 2 via ORAL
  Filled 2019-01-25: qty 2
  Filled 2019-01-25 (×2): qty 1
  Filled 2019-01-25 (×2): qty 2

## 2019-01-25 MED ORDER — POTASSIUM CHLORIDE CRYS ER 20 MEQ PO TBCR
20.0000 meq | EXTENDED_RELEASE_TABLET | Freq: Two times a day (BID) | ORAL | Status: DC
  Administered 2019-01-25 – 2019-01-26 (×3): 20 meq via ORAL
  Filled 2019-01-25 (×3): qty 1

## 2019-01-25 MED ORDER — DEXMEDETOMIDINE HCL IN NACL 200 MCG/50ML IV SOLN
INTRAVENOUS | Status: AC
  Filled 2019-01-25: qty 50

## 2019-01-25 MED ORDER — LIDOCAINE HCL (CARDIAC) PF 100 MG/5ML IV SOSY
PREFILLED_SYRINGE | INTRAVENOUS | Status: DC | PRN
  Administered 2019-01-25: 100 mg via INTRAVENOUS

## 2019-01-25 MED ORDER — CEFAZOLIN SODIUM-DEXTROSE 2-4 GM/100ML-% IV SOLN
INTRAVENOUS | Status: AC
  Filled 2019-01-25: qty 100

## 2019-01-25 MED ORDER — HYDROCHLOROTHIAZIDE 12.5 MG PO CAPS
12.5000 mg | ORAL_CAPSULE | Freq: Two times a day (BID) | ORAL | Status: DC
  Administered 2019-01-25 – 2019-01-26 (×2): 12.5 mg via ORAL
  Filled 2019-01-25 (×3): qty 1

## 2019-01-25 MED ORDER — LISINOPRIL 20 MG PO TABS
20.0000 mg | ORAL_TABLET | Freq: Two times a day (BID) | ORAL | Status: DC
  Administered 2019-01-25 – 2019-01-26 (×2): 20 mg via ORAL
  Filled 2019-01-25 (×3): qty 1

## 2019-01-25 MED ORDER — PROPOFOL 10 MG/ML IV BOLUS
INTRAVENOUS | Status: DC | PRN
  Administered 2019-01-25: 150 mg via INTRAVENOUS

## 2019-01-25 MED ORDER — LIDOCAINE-EPINEPHRINE 1 %-1:100000 IJ SOLN
INTRAMUSCULAR | Status: DC | PRN
  Administered 2019-01-25: 17 mL

## 2019-01-25 MED ORDER — FENTANYL CITRATE (PF) 250 MCG/5ML IJ SOLN
INTRAMUSCULAR | Status: AC
  Filled 2019-01-25: qty 5

## 2019-01-25 MED ORDER — FENTANYL CITRATE (PF) 100 MCG/2ML IJ SOLN
INTRAMUSCULAR | Status: DC | PRN
  Administered 2019-01-25 (×3): 50 ug via INTRAVENOUS
  Administered 2019-01-25: 100 ug via INTRAVENOUS

## 2019-01-25 MED ORDER — DEXAMETHASONE SODIUM PHOSPHATE 10 MG/ML IJ SOLN
INTRAMUSCULAR | Status: DC | PRN
  Administered 2019-01-25: 8 mg via INTRAVENOUS

## 2019-01-25 MED ORDER — ACETAMINOPHEN 500 MG PO TABS
1000.0000 mg | ORAL_TABLET | ORAL | Status: AC
  Administered 2019-01-25: 1000 mg via ORAL

## 2019-01-25 MED ORDER — GABAPENTIN 300 MG PO CAPS
ORAL_CAPSULE | ORAL | Status: AC
  Administered 2019-01-25: 300 mg via ORAL
  Filled 2019-01-25: qty 1

## 2019-01-25 MED ORDER — METHYLENE BLUE 0.5 % INJ SOLN
INTRAVENOUS | Status: AC
  Filled 2019-01-25: qty 10

## 2019-01-25 MED ORDER — SODIUM CHLORIDE FLUSH 0.9 % IV SOLN
INTRAVENOUS | Status: AC
  Filled 2019-01-25: qty 30

## 2019-01-25 MED ORDER — ESTROGENS, CONJUGATED 0.625 MG/GM VA CREA
TOPICAL_CREAM | VAGINAL | Status: AC
  Filled 2019-01-25: qty 30

## 2019-01-25 MED ORDER — ONDANSETRON HCL 4 MG/2ML IJ SOLN
INTRAMUSCULAR | Status: AC
  Filled 2019-01-25: qty 2

## 2019-01-25 MED ORDER — LACTATED RINGERS IV SOLN
INTRAVENOUS | Status: DC
  Administered 2019-01-25 (×2): via INTRAVENOUS

## 2019-01-25 MED ORDER — FAMOTIDINE 20 MG PO TABS
ORAL_TABLET | ORAL | Status: AC
  Administered 2019-01-25: 20 mg via ORAL
  Filled 2019-01-25: qty 1

## 2019-01-25 MED ORDER — CELECOXIB 200 MG PO CAPS
ORAL_CAPSULE | ORAL | Status: AC
  Administered 2019-01-25: 400 mg via ORAL
  Filled 2019-01-25: qty 2

## 2019-01-25 MED ORDER — LIDOCAINE-EPINEPHRINE 1 %-1:100000 IJ SOLN
INTRAMUSCULAR | Status: AC
  Filled 2019-01-25: qty 1

## 2019-01-25 MED ORDER — CEFAZOLIN SODIUM-DEXTROSE 2-4 GM/100ML-% IV SOLN
2.0000 g | Freq: Once | INTRAVENOUS | Status: AC
  Administered 2019-01-25: 2 g via INTRAVENOUS

## 2019-01-25 MED ORDER — DEXMEDETOMIDINE HCL 200 MCG/2ML IV SOLN
INTRAVENOUS | Status: DC | PRN
  Administered 2019-01-25: 12 ug via INTRAVENOUS
  Administered 2019-01-25 (×3): 4 ug via INTRAVENOUS

## 2019-01-25 MED ORDER — DIPHENHYDRAMINE HCL 50 MG/ML IJ SOLN
INTRAMUSCULAR | Status: AC
  Filled 2019-01-25: qty 1

## 2019-01-25 MED ORDER — DEXAMETHASONE SODIUM PHOSPHATE 10 MG/ML IJ SOLN
INTRAMUSCULAR | Status: AC
  Filled 2019-01-25: qty 1

## 2019-01-25 MED ORDER — ACETAMINOPHEN 500 MG PO TABS
ORAL_TABLET | ORAL | Status: AC
  Administered 2019-01-25: 1000 mg via ORAL
  Filled 2019-01-25: qty 2

## 2019-01-25 MED ORDER — FENTANYL CITRATE (PF) 100 MCG/2ML IJ SOLN
INTRAMUSCULAR | Status: AC
  Administered 2019-01-25: 50 ug via INTRAVENOUS
  Filled 2019-01-25: qty 2

## 2019-01-25 MED ORDER — ZOLPIDEM TARTRATE 5 MG PO TABS
10.0000 mg | ORAL_TABLET | Freq: Every evening | ORAL | Status: DC | PRN
  Administered 2019-01-25: 10 mg via ORAL
  Filled 2019-01-25: qty 2

## 2019-01-25 MED ORDER — ONDANSETRON HCL 4 MG/2ML IJ SOLN: INTRAMUSCULAR | Status: DC | PRN

## 2019-01-25 SURGICAL SUPPLY — 42 items
BAG URINE DRAINAGE (UROLOGICAL SUPPLIES) ×3 IMPLANT
BLADE SURG SZ10 CARB STEEL (BLADE) ×3 IMPLANT
BNDG GAUZE 4.5X4.1 6PLY STRL (MISCELLANEOUS) ×3 IMPLANT
CATH FOLEY 2WAY  5CC 16FR (CATHETERS) ×1
CATH FOLEY 2WAY 5CC 16FR (CATHETERS) ×2
CATH URTH 16FR FL 2W BLN LF (CATHETERS) ×2 IMPLANT
COUNTER NEEDLE 20/40 LG (NEEDLE) ×1 IMPLANT
COVER WAND RF STERILE (DRAPES) ×3 IMPLANT
DRAPE PERI LITHO V/GYN (MISCELLANEOUS) ×3 IMPLANT
DRAPE SHEET LG 3/4 BI-LAMINATE (DRAPES) ×3 IMPLANT
DRAPE SURG 17X11 SM STRL (DRAPES) ×3 IMPLANT
DRAPE UNDER BUTTOCK W/FLU (DRAPES) ×3 IMPLANT
ELECT REM PT RETURN 9FT ADLT (ELECTROSURGICAL) ×3
ELECTRODE REM PT RTRN 9FT ADLT (ELECTROSURGICAL) ×2 IMPLANT
GAUZE 4X4 16PLY RFD (DISPOSABLE) ×3 IMPLANT
GLOVE BIO SURGEON STRL SZ8 (GLOVE) ×3 IMPLANT
GOWN STRL REUS W/ TWL LRG LVL3 (GOWN DISPOSABLE) ×6 IMPLANT
GOWN STRL REUS W/ TWL XL LVL3 (GOWN DISPOSABLE) ×2 IMPLANT
GOWN STRL REUS W/TWL LRG LVL3 (GOWN DISPOSABLE) ×9
GOWN STRL REUS W/TWL XL LVL3 (GOWN DISPOSABLE) ×3
GRADUATE 1200CC STRL 31836 (MISCELLANEOUS) ×1 IMPLANT
IRRIGATION STRYKERFLOW (MISCELLANEOUS) IMPLANT
IRRIGATOR STRYKERFLOW (MISCELLANEOUS) ×3
KIT TURNOVER CYSTO (KITS) ×3 IMPLANT
NDL SAFETY ECLIPSE 18X1.5 (NEEDLE) ×2 IMPLANT
NEEDLE HYPO 18GX1.5 SHARP (NEEDLE) ×3
NEEDLE HYPO 22GX1.5 SAFETY (NEEDLE) ×3 IMPLANT
NS IRRIG 500ML POUR BTL (IV SOLUTION) ×3 IMPLANT
PACK BASIN MINOR ARMC (MISCELLANEOUS) ×3 IMPLANT
PAD OB MATERNITY 4.3X12.25 (PERSONAL CARE ITEMS) ×3 IMPLANT
PAD PREP 24X41 OB/GYN DISP (PERSONAL CARE ITEMS) ×3 IMPLANT
SHEARS HARMONIC ACE PLUS 36CM (ENDOMECHANICALS) ×1 IMPLANT
SHEARS HARMONIC STRL 23CM (MISCELLANEOUS) ×1 IMPLANT
SLEEVE ENDOPATH XCEL 5M (ENDOMECHANICALS) ×1 IMPLANT
SUT MNCRL AB 4-0 PS2 18 (SUTURE) ×1 IMPLANT
SUT VIC AB 0 CT1 27 (SUTURE) ×3
SUT VIC AB 0 CT1 27XCR 8 STRN (SUTURE) ×4 IMPLANT
SUT VIC AB 0 CT1 36 (SUTURE) ×3 IMPLANT
SYR 10ML LL (SYRINGE) ×3 IMPLANT
SYR 30ML LL (SYRINGE) ×3 IMPLANT
SYR CONTROL 10ML (SYRINGE) ×3 IMPLANT
TOWEL OR 17X26 4PK STRL BLUE (TOWEL DISPOSABLE) ×1 IMPLANT

## 2019-01-25 NOTE — OR Nursing (Signed)
Dr. Shela Commons. Piscitello notified of K+ result of 3.1 today.

## 2019-01-25 NOTE — Progress Notes (Signed)
Labs reviewed K+ still low Ready for L/S cervical trachelectomy All questions answered

## 2019-01-25 NOTE — Progress Notes (Signed)
Patient ID: Victoria Guzman, female   DOB: 01/16/1966, 53 y.o.   MRN: [id number] Reviewed case with pt . suboptimal pain control   Increasing percocet dose  Exam nl  VSS Anticipate d/c in am

## 2019-01-25 NOTE — Progress Notes (Signed)
Dr. Feliberto Gottron notified of elevated blood pressure and patient requesting multiple home medications be ordered while admitted in hospital. Dr. Feliberto Gottron to go see patient.

## 2019-01-25 NOTE — Anesthesia Post-op Follow-up Note (Signed)
Anesthesia QCDR form completed.        

## 2019-01-25 NOTE — Brief Op Note (Signed)
01/25/2019  1:44 PM  PATIENT:  Victoria Guzman  53 y.o. female  PRE-OPERATIVE DIAGNOSIS:  cervical dysplasia Prior LSH   POST-OPERATIVE DIAGNOSIS:  cervical dysplasia, pelvic adhesions   PROCEDURE:  Procedure(s): TRACHELECTOMY, laparoscopic cervical (N/A) LAPAROSCOPIC BILATERAL SALPINGECTOMY (Bilateral) Lysis of adhesions SURGEON:  Surgeon(s) and Role:    * Tyrez Berrios, Ihor Austin, MD - Primary    * Ward, Elenora Fender, MD - Assisting  PHYSICIAN ASSISTANT: PA student Cyr  ASSISTANTS: none   ANESTHESIA:   general  EBL:  25 mL   BLOOD ADMINISTERED:none  DRAINS: Urinary Catheter (Foley)   LOCAL MEDICATIONS USED:  LIDOCAINE  and Amount: 15 ml  SPECIMEN:  Source of Specimen:  cervix and portion bilateral fallopian tubes   DISPOSITION OF SPECIMEN:  PATHOLOGY  COUNTS:  YES  TOURNIQUET:  * No tourniquets in log *  DICTATION: .Other Dictation: Dictation Number verbal  PLAN OF CARE: Admit for overnight observation  PATIENT DISPOSITION:  PACU - hemodynamically stable.   Delay start of Pharmacological VTE agent (>24hrs) due to surgical blood loss or risk of bleeding: not applicable

## 2019-01-25 NOTE — Anesthesia Procedure Notes (Addendum)
Procedure Name: Intubation Date/Time: 01/25/2019 11:31 AM Performed by: Andria Frames, MD Pre-anesthesia Checklist: Patient identified, Emergency Drugs available, Suction available and Patient being monitored Patient Re-evaluated:Patient Re-evaluated prior to induction Oxygen Delivery Method: Circle system utilized Preoxygenation: Pre-oxygenation with 100% oxygen Induction Type: IV induction Ventilation: Mask ventilation without difficulty Laryngoscope Size: Mac and 3 Grade View: Grade II Tube type: Oral Tube size: 7.0 mm Number of attempts: 1 Airway Equipment and Method: Stylet and Oral airway Placement Confirmation: ETT inserted through vocal cords under direct vision,  positive ETCO2 and breath sounds checked- equal and bilateral Tube secured with: Tape Dental Injury: Teeth and Oropharynx as per pre-operative assessment

## 2019-01-25 NOTE — Transfer of Care (Signed)
Immediate Anesthesia Transfer of Care Note  Patient: Victoria Guzman  Procedure(s) Performed: TRACHELECTOMY, laparoscopic cervical (N/A ) LAPAROSCOPIC BILATERAL SALPINGECTOMY (Bilateral )  Patient Location: PACU  Anesthesia Type:General  Level of Consciousness: awake and drowsy  Airway & Oxygen Therapy: Patient Spontanous Breathing and Patient connected to face mask oxygen  Post-op Assessment: Report given to RN and Post -op Vital signs reviewed and stable  Post vital signs: Reviewed and stable  Last Vitals:  Vitals Value Taken Time  BP 164/93 01/25/2019  1:49 PM  Temp    Pulse 78 01/25/2019  1:49 PM  Resp 21 01/25/2019  1:49 PM  SpO2 100 % 01/25/2019  1:49 PM  Vitals shown include unvalidated device data.  Last Pain:  Vitals:   01/25/19 0926  TempSrc: Tympanic  PainSc: 7          Complications: No apparent anesthesia complications

## 2019-01-25 NOTE — Anesthesia Preprocedure Evaluation (Signed)
Anesthesia Evaluation  Patient identified by MRN, date of birth, ID band Patient awake    Reviewed: Allergy & Precautions, H&P , NPO status , Patient's Chart, lab work & pertinent test results  History of Anesthesia Complications (+) Family history of anesthesia reaction and history of anesthetic complications  Airway Mallampati: III  TM Distance: >3 FB Neck ROM: full    Dental  (+) Chipped, Poor Dentition   Pulmonary neg shortness of breath, pneumonia, COPD,           Cardiovascular Exercise Tolerance: Good hypertension, (-) angina(-) Past MI and (-) DOE      Neuro/Psych PSYCHIATRIC DISORDERS  Neuromuscular disease CVA, Residual Symptoms    GI/Hepatic negative GI ROS, Neg liver ROS, neg GERD  ,  Endo/Other  negative endocrine ROS  Renal/GU Renal disease     Musculoskeletal  (+) Fibromyalgia -  Abdominal   Peds  Hematology negative hematology ROS (+)   Anesthesia Other Findings Past Medical History: No date: Anemia No date: Anxiety No date: Chronic fatigue 05/31/2016: Chronic insomnia No date: Depression No date: Family history of adverse reaction to anesthesia     Comment:  mother difficult to wake up after CABG No date: Fibromyalgia No date: Hypertension No date: Insomnia No date: Panic attack No date: Stroke [facility])     Comment:  tia 05/31/2016: Vitamin D deficiency  Past Surgical History: No date: ABDOMINAL HYSTERECTOMY 07/24/2018: COLONOSCOPY WITH PROPOFOL; N/A     Comment:  Procedure: COLONOSCOPY WITH PROPOFOL;  Surgeon: Tol[place],               Boykin Nearing, MD;  Location: [facility] ENDOSCOPY;  Service:               Gastroenterology;  Laterality: N/A; 07/24/2018: ESOPHAGOGASTRODUODENOSCOPY (EGD) WITH PROPOFOL; N/A     Comment:  Procedure: ESOPHAGOGASTRODUODENOSCOPY (EGD) WITH               PROPOFOL;  Surgeon: Toledo, Boykin Nearing, MD;  Location:               [facility] ENDOSCOPY;  Service: Gastroenterology;   Laterality:               N/A; No date: PARATHYROIDECTOMY No date: PARTIAL HYSTERECTOMY     Reproductive/Obstetrics negative OB ROS                             Anesthesia Physical Anesthesia Plan  ASA: III  Anesthesia Plan: General ETT   Post-op Pain Management:    Induction: Intravenous  PONV Risk Score and Plan: Ondansetron, Dexamethasone, Midazolam and Treatment may vary due to age or medical condition  Airway Management Planned: Oral ETT  Additional Equipment:   Intra-op Plan:   Post-operative Plan: Extubation in OR  Informed Consent: I have reviewed the patients History and Physical, chart, labs and discussed the procedure including the risks, benefits and alternatives for the proposed anesthesia with the patient or authorized representative who has indicated his/her understanding and acceptance.     Dental Advisory Given  Plan Discussed with: Anesthesiologist, CRNA and Surgeon  Anesthesia Plan Comments: (Patient consented for risks of anesthesia including but not limited to:  - adverse reactions to medications - damage to teeth, lips or other oral mucosa - sore throat or hoarseness - Damage to heart, brain, lungs or loss of life  Patient voiced understanding.)        Anesthesia Quick Evaluation

## 2019-01-25 NOTE — Anesthesia Postprocedure Evaluation (Signed)
Anesthesia Post Note  Patient: Victoria Guzman  Procedure(s) Performed: TRACHELECTOMY, laparoscopic cervical (N/A ) LAPAROSCOPIC BILATERAL SALPINGECTOMY (Bilateral )  Patient location during evaluation: PACU Anesthesia Type: General Level of consciousness: awake and alert Pain management: pain level controlled Vital Signs Assessment: post-procedure vital signs reviewed and stable Respiratory status: spontaneous breathing, nonlabored ventilation, respiratory function stable and patient connected to nasal cannula oxygen Cardiovascular status: blood pressure returned to baseline and stable Postop Assessment: no apparent nausea or vomiting Anesthetic complications: no     Last Vitals:  Vitals:   01/25/19 1404 01/25/19 1412  BP: (!) 159/90   Pulse: 73 79  Resp: 18 17  Temp:    SpO2: 100% 100%    Last Pain:  Vitals:   01/25/19 1419  TempSrc:   PainSc: 6                  Cleda Mccreedy Piscitello

## 2019-01-26 DIAGNOSIS — N879 Dysplasia of cervix uteri, unspecified: Secondary | ICD-10-CM | POA: Diagnosis not present

## 2019-01-26 LAB — BASIC METABOLIC PANEL
ANION GAP: 7 (ref 5–15)
BUN: 16 mg/dL (ref 6–20)
CHLORIDE: 102 mmol/L (ref 98–111)
CO2: 24 mmol/L (ref 22–32)
Calcium: 8.3 mg/dL — ABNORMAL LOW (ref 8.9–10.3)
Creatinine, Ser: 1.28 mg/dL — ABNORMAL HIGH (ref 0.44–1.00)
GFR calc Af Amer: 56 mL/min — ABNORMAL LOW (ref >60)
GFR calc non Af Amer: 49 mL/min — ABNORMAL LOW (ref >60)
Glucose, Bld: 102 mg/dL — ABNORMAL HIGH (ref 70–99)
Potassium: 4 mmol/L (ref 3.5–5.1)
Sodium: 133 mmol/L — ABNORMAL LOW (ref 135–145)

## 2019-01-26 LAB — CBC
HCT: 30.6 % — ABNORMAL LOW (ref 36.0–46.0)
HEMOGLOBIN: 9.7 g/dL — AB (ref 12.0–15.0)
MCH: 25.4 pg — ABNORMAL LOW (ref 26.0–34.0)
MCHC: 31.7 g/dL (ref 30.0–36.0)
MCV: 80.1 fL (ref 80.0–100.0)
Platelets: 366 10*3/uL (ref 150–400)
RBC: 3.82 MIL/uL — ABNORMAL LOW (ref 3.87–5.11)
RDW: 15.1 % (ref 11.5–15.5)
WBC: 13.8 10*3/uL — AB (ref 4.0–10.5)
nRBC: 0 % (ref 0.0–0.2)

## 2019-01-26 MED ORDER — IBUPROFEN 600 MG PO TABS: 600.0000 mg | ORAL_TABLET | Freq: Four times a day (QID) | ORAL | 0 refills | Status: DC | PRN

## 2019-01-26 MED ORDER — OXYCODONE-ACETAMINOPHEN 5-325 MG PO TABS: 1.0000 | ORAL_TABLET | Freq: Four times a day (QID) | ORAL | 0 refills | Status: DC | PRN

## 2019-01-26 NOTE — Plan of Care (Signed)
Patient's vital signs BP 152/94 and BP 156/89, HR 90's to 107, RR 20 unlabored, afebrile; saline lock intact; site clear; patient tolerating regular diet well; voiding; ambulating in hall with nurse assist; pain controlled with percocet po and motrin po; patient using incentive spirometry (1250) well; patient's sister at bedside and attentive.

## 2019-01-26 NOTE — Op Note (Signed)
NAME: Victoria Guzman, Victoria Guzman MEDICAL RECORD [id number] ACCOUNT [id number] DATE OF BIRTH:1966-03-23 FACILITY: [facility] LOCATION: [facility] PHYSICIAN:THOMAS Cloyde Reams, MD  OPERATIVE REPORT  DATE OF PROCEDURE:  01/25/2019  PREOPERATIVE DIAGNOSES: 1.  Cervical dysplasia, inadequate colposcopy. 2.  Prior laparoscopic supracervical hysterectomy.  POSTOPERATIVE DIAGNOSES:  1.  Cervical dysplasia, inadequate colposcopy. 2.  Prior laparoscopic supracervical hysterectomy. 3.  Pelvic adhesions PROCEDURE: 1.  Laparoscopic assisted cervical trachelectomy. 2.  Bilateral salpingectomy. 3.  Lysis of adhesions.  ANESTHESIA:  General endotracheal anesthesia.  SURGEON:  Jennell Corner, MD.  FIRST ASSISTANT:  Ranae Plumber, MD.  SECOND ASSISTANT:  PA student, CYR.  INDICATIONS:  A 53 year old gravida 2, para 2 patient evaluated in the office for low-grade cervical dysplasia.  The patient is status post [facility] and during the colposcopic evaluation, it was deemed to be inadequate.  The patient was offered a cervical  LEEP in the office versus definitive cervical trachelectomy and she opted for the latter.  DESCRIPTION OF PROCEDURE:  After adequate general endotracheal anesthesia, the patient was placed in dorsal supine position, legs in the Wynona stirrups.  The patient's abdomen, perineum and vagina were prepped and draped in normal sterile fashion.  The  patient did receive 2 grams IV Ancef prior to commencement of the case.  Timeout was performed.  A straight catheterization of the bladder attached to the Foley bag yielded 200 mL urine.  Foley catheter was removed and gloves were changed.  Attention was  directed to the patient's abdomen where a 5 mm infraumbilical incision was made after injecting with 1% lidocaine with epinephrine.  A #5 laparoscope was advanced into the abdominal cavity under direct visualization with the Optiview cannula.  The  patient's abdomen was insufflated.  Two  additional port sites were placed in the left lower and right lower quadrants, both 3 cm medial to the anterior iliac spines and direct visualization was used to advance the trocars into the abdominal cavity.   Initial impression revealed several bowel and epiploica adhesions to the vaginal cuff and cervical stump.  A Harmonic scalpel was brought up and meticulous dissection of these adhesions from the cervix and the vaginal cuff was performed.  Each fallopian  tube was grasped with the fimbriated end and with the Harmonic scalpel.  A partial salpingectomy was performed bilaterally and each portion of fallopian tube was removed to be sent to pathology.  The anterior cervix was placed on tension, aided by a  vaginal double tooth tenaculum and the vesicouterine peritoneal fold was dissected back.  Attention then was directed vaginally where the cervix was grasped with a thyroid tenaculum and was placed on tension.  A direct posterior colpotomy incision was  made upon entering into the posterior cul-de-sac.  The long billed weighted speculum was placed.  Uterosacral ligaments were bilaterally clamped, transected, suture ligated with 0 Vicryl suture.  Circumferential incision was made with the Bovie  anteriorly and the cervical stump was dissected with serial clamping and dissection and suturing.  Once the cervical stump was removed.  The bladder was retrograde filled with normal saline and methylene blue and there was no evidence of bladder injury  after filling with 200 mL of solution.  The vaginal cuff was then closed with 0 Vicryl suture in a running nonlocking fashion.  Good approximation of edges.  Good hemostasis was noted.  Gloves were changed again and attention was then directed again to  the abdominal region.  The patient's abdomen was insufflated again and the pelvis was copiously  irrigated with normal saline and good hemostasis was noted.  The intra-abdominal pressure was lowered to 7 mmHg and good  hemostasis was noted.  The upper  abdomen appeared normal.  The patient's abdomen was then deflated and the 3 incisions were closed with interrupted 4-0 Vicryl suture.  Sterile dressings applied.  Foley catheter placed at the end of the case.  COMPLICATIONS:  There were no complications.  ESTIMATED BLOOD LOSS:  25 mL.  INTRAOPERATIVE FLUIDS:  400 mL.  DISPOSITION:  The patient was taken to recovery room in good condition.  AN/NUANCE  D:01/25/2019 T:01/26/2019 JOB:[id number]

## 2019-01-26 NOTE — Discharge Summary (Signed)
Physician Discharge Summary  Patient ID: Victoria Guzman MRN: [id number] DOB/AGE: [birth year] 53 y.o.  Admit date: 01/25/2019 Discharge date: 01/26/2019  Admission Diagnoses: cervical dysplasia  Discharge Diagnoses:  Active Problems:   Post-operative state   Discharged Condition: good  Hospital Course: sheunderwent an uncomplicated l/s assisted cervical trachelectomy and bilateral salpingectomy . Uncomplicated post op course  Consults: None  Significant Diagnostic Studies: labs:  Results for orders placed or performed during the hospital encounter of 01/25/19 (from the past 24 hour(s))  ABO/Rh     Status: None   Collection Time: 01/25/19  9:33 AM  Result Value Ref Range   ABO/RH(D)      O POS Performed at [facility], [address] [place], [place] [address]   I-STAT 4, (NA,K, GLUC, HGB,HCT)     Status: Abnormal   Collection Time: 01/25/19  9:46 AM  Result Value Ref Range   Sodium 137 135 - 145 mmol/L   Potassium 3.1 (L) 3.5 - 5.1 mmol/L   Glucose, Bld 80 70 - 99 mg/dL   HCT 72.5 (L) 36.6 - 44.0 %   Hemoglobin 11.9 (L) 12.0 - 15.0 g/dL  CBC     Status: Abnormal   Collection Time: 01/26/19  6:17 AM  Result Value Ref Range   WBC 13.8 (H) 4.0 - 10.5 K/uL   RBC 3.82 (L) 3.87 - 5.11 MIL/uL   Hemoglobin 9.7 (L) 12.0 - 15.0 g/dL   HCT 34.7 (L) 42.5 - 95.6 %   MCV 80.1 80.0 - 100.0 fL   MCH 25.4 (L) 26.0 - 34.0 pg   MCHC 31.7 30.0 - 36.0 g/dL   RDW 38.7 56.4 - 33.2 %   Platelets 366 150 - 400 K/uL   nRBC 0.0 0.0 - 0.2 %  Basic metabolic panel     Status: Abnormal   Collection Time: 01/26/19  6:17 AM  Result Value Ref Range   Sodium 133 (L) 135 - 145 mmol/L   Potassium 4.0 3.5 - 5.1 mmol/L   Chloride 102 98 - 111 mmol/L   CO2 24 22 - 32 mmol/L   Glucose, Bld 102 (H) 70 - 99 mg/dL   BUN 16 6 - 20 mg/dL   Creatinine, Ser 9.51 (H) 0.44 - 1.00 mg/dL   Calcium 8.3 (L) 8.9 - 10.3 mg/dL   GFR calc non Af Amer 49 (L) >60 mL/min   GFR calc Af Amer 56 (L) >60  mL/min   Anion gap 7 5 - 15     Treatments: surgery: as above  Discharge Exam: Blood pressure 112/71, pulse 83, temperature 98.1 F (36.7 C), temperature source Oral, resp. rate 18, SpO2 100 %. General appearance: alert and cooperative Head: Normocephalic, without obvious abnormality, atraumatic Resp: clear to auscultation bilaterally Cardio: regular rate and rhythm, S1, S2 normal, no murmur, click, rub or gallop GI: soft, non-tender; bowel sounds normal; no masses,  no organomegaly Incision C/D/I Disposition: Discharge disposition: 01-Home or Self Care       Discharge Instructions    Call MD for:  difficulty breathing, headache or visual disturbances   Complete by:  As directed    Call MD for:  extreme fatigue   Complete by:  As directed    Call MD for:  hives   Complete by:  As directed    Call MD for:  persistant dizziness or light-headedness   Complete by:  As directed    Call MD for:  persistant nausea and vomiting   Complete by:  As directed    Call MD for:  redness, tenderness, or signs of infection (pain, swelling, redness, odor or green/yellow discharge around incision site)   Complete by:  As directed    Call MD for:  severe uncontrolled pain   Complete by:  As directed    Call MD for:  temperature >100.4   Complete by:  As directed    Diet - low sodium heart healthy   Complete by:  As directed    Increase activity slowly   Complete by:  As directed      Allergies as of 01/26/2019      Reactions   Clonidine Derivatives Hives   Quinolones Hives, Swelling   Zofran [ondansetron Hcl] Itching, Swelling, Other (See Comments)   swelling of lips      Medication List    STOP taking these medications   chlorpheniramine-HYDROcodone 10-8 MG/5ML Suer Commonly known as:  TUSSIONEX PENNKINETIC ER     TAKE these medications   albuterol 108 (90 Base) MCG/ACT inhaler Commonly known as:  PROAIR HFA Inhale 2 puffs into the lungs as needed. What changed:    when  to take this  reasons to take this   Cholecalciferol 50 MCG (2000 UT) Caps Take 2,000 Units by mouth daily.   clonazePAM 1 MG tablet Commonly known as:  KLONOPIN Take 1 tablet (1 mg total) by mouth 2 (two) times daily.   fluconazole 100 MG tablet Commonly known as:  DIFLUCAN Take 100 mg by mouth See admin instructions. Take 100mg  by mouth once a day for 7 days while on antibiotics.   folic acid 1 MG tablet Commonly known as:  FOLVITE Take 1 mg by mouth daily.   ibuprofen 600 MG tablet Commonly known as:  ADVIL,MOTRIN Take 1 tablet (600 mg total) by mouth every 6 (six) hours as needed for fever or headache.   lisinopril-hydrochlorothiazide 20-12.5 MG tablet Commonly known as:  PRINZIDE,ZESTORETIC Take 1 tablet by mouth 2 (two) times daily.   oxyCODONE-acetaminophen 5-325 MG tablet Commonly known as:  PERCOCET/ROXICET Take 1 tablet by mouth every 8 (eight) hours as needed for moderate pain. What changed:  Another medication with the same name was added. Make sure you understand how and when to take each.   oxyCODONE-acetaminophen 5-325 MG tablet Commonly known as:  PERCOCET/ROXICET Take 1-2 tablets by mouth every 6 (six) hours as needed for moderate pain. What changed:  You were already taking a medication with the same name, and this prescription was added. Make sure you understand how and when to take each.   tiZANidine 4 MG capsule Commonly known as:  ZANAFLEX Take 1 capsule (4 mg total) by mouth 3 (three) times daily.   valACYclovir 1000 MG tablet Commonly known as:  VALTREX Take 1,000 mg by mouth See admin instructions. Take 1000mg  by mouth twice a day for 7 days as needed for outbreaks   zolpidem 10 MG tablet Commonly known as:  AMBIEN Take 10 mg by mouth at bedtime.      Follow-up Information    Schermerhorn, Ihor Austin, MD Follow up in 2 week(s).   Specialty:  Obstetrics and Gynecology Contact information: [address] [place] [address] [phone]           Signed: Ihor Austin Schermerhorn 01/26/2019, 8:04 AM

## 2019-01-26 NOTE — Progress Notes (Signed)
Patient discharged to home. Medications/Prescriptions and discharge instructions reviewed extensively with patient who verbalized understanding. Pt discharged with family via W/C. Will schedule follow-up as instructed. 

## 2019-01-28 ENCOUNTER — Encounter: Payer: Self-pay | Admitting: Obstetrics and Gynecology

## 2019-01-30 LAB — SURGICAL PATHOLOGY

## 2019-10-28 ENCOUNTER — Other Ambulatory Visit: Payer: Self-pay | Admitting: Obstetrics and Gynecology

## 2019-10-28 DIAGNOSIS — Z1231 Encounter for screening mammogram for malignant neoplasm of breast: Secondary | ICD-10-CM

## 2019-11-06 ENCOUNTER — Ambulatory Visit
Admission: RE | Admit: 2019-11-06 | Discharge: 2019-11-06 | Disposition: A | Discharge status: Home / Self Care | Payer: Medicare Other | Source: Ambulatory Visit | Attending: Obstetrics and Gynecology | Admitting: Obstetrics and Gynecology

## 2019-11-06 DIAGNOSIS — Z1231 Encounter for screening mammogram for malignant neoplasm of breast: Secondary | ICD-10-CM | POA: Insufficient documentation

## 2020-10-23 ENCOUNTER — Encounter: Payer: Self-pay | Admitting: Emergency Medicine

## 2020-10-23 ENCOUNTER — Emergency Department
Admission: EM | Admit: 2020-10-23 | Discharge: 2020-10-23 | Disposition: A | Discharge status: Home / Self Care | Payer: Medicare Other | Attending: Emergency Medicine | Admitting: Emergency Medicine

## 2020-10-23 ENCOUNTER — Other Ambulatory Visit: Payer: Self-pay

## 2020-10-23 ENCOUNTER — Emergency Department: Payer: Medicare Other

## 2020-10-23 DIAGNOSIS — Y92410 Unspecified street and highway as the place of occurrence of the external cause: Secondary | ICD-10-CM | POA: Diagnosis not present

## 2020-10-23 DIAGNOSIS — R0781 Pleurodynia: Secondary | ICD-10-CM | POA: Insufficient documentation

## 2020-10-23 DIAGNOSIS — S199XXA Unspecified injury of neck, initial encounter: Secondary | ICD-10-CM | POA: Diagnosis present

## 2020-10-23 DIAGNOSIS — S39012A Strain of muscle, fascia and tendon of lower back, initial encounter: Secondary | ICD-10-CM

## 2020-10-23 DIAGNOSIS — I10 Essential (primary) hypertension: Secondary | ICD-10-CM | POA: Insufficient documentation

## 2020-10-23 DIAGNOSIS — Y9389 Activity, other specified: Secondary | ICD-10-CM | POA: Insufficient documentation

## 2020-10-23 DIAGNOSIS — Z79899 Other long term (current) drug therapy: Secondary | ICD-10-CM | POA: Diagnosis not present

## 2020-10-23 DIAGNOSIS — S8012XA Contusion of left lower leg, initial encounter: Secondary | ICD-10-CM | POA: Diagnosis not present

## 2020-10-23 DIAGNOSIS — M5416 Radiculopathy, lumbar region: Secondary | ICD-10-CM | POA: Insufficient documentation

## 2020-10-23 DIAGNOSIS — S161XXA Strain of muscle, fascia and tendon at neck level, initial encounter: Secondary | ICD-10-CM

## 2020-10-23 LAB — COMPREHENSIVE METABOLIC PANEL
ALT: 17 U/L (ref 0–44)
AST: 17 U/L (ref 15–41)
Albumin: 4 g/dL (ref 3.5–5.0)
Alkaline Phosphatase: 66 U/L (ref 38–126)
Anion gap: 10 (ref 5–15)
BUN: 35 mg/dL — ABNORMAL HIGH (ref 6–20)
CO2: 27 mmol/L (ref 22–32)
Calcium: 8.9 mg/dL (ref 8.9–10.3)
Chloride: 101 mmol/L (ref 98–111)
Creatinine, Ser: 1.56 mg/dL — ABNORMAL HIGH (ref 0.44–1.00)
GFR, Estimated: 40 mL/min — ABNORMAL LOW (ref >60)
Glucose, Bld: 98 mg/dL (ref 70–99)
Potassium: 3.4 mmol/L — ABNORMAL LOW (ref 3.5–5.1)
Sodium: 138 mmol/L (ref 135–145)
Total Bilirubin: 0.7 mg/dL (ref 0.3–1.2)
Total Protein: 7.8 g/dL (ref 6.5–8.1)

## 2020-10-23 LAB — URINALYSIS, COMPLETE (UACMP) WITH MICROSCOPIC
Bilirubin Urine: NEGATIVE
Glucose, UA: NEGATIVE mg/dL
Hgb urine dipstick: NEGATIVE
Ketones, ur: NEGATIVE mg/dL
Leukocytes,Ua: NEGATIVE
Nitrite: NEGATIVE
Protein, ur: NEGATIVE mg/dL
Specific Gravity, Urine: 1.012 (ref 1.005–1.030)
Squamous Epithelial / HPF: NONE SEEN (ref 0–5)
pH: 5 (ref 5.0–8.0)

## 2020-10-23 LAB — CBC
HCT: 35.3 % — ABNORMAL LOW (ref 36.0–46.0)
Hemoglobin: 11.6 g/dL — ABNORMAL LOW (ref 12.0–15.0)
MCH: 27.5 pg (ref 26.0–34.0)
MCHC: 32.9 g/dL (ref 30.0–36.0)
MCV: 83.6 fL (ref 80.0–100.0)
Platelets: 337 10*3/uL (ref 150–400)
RBC: 4.22 MIL/uL (ref 3.87–5.11)
RDW: 15.5 % (ref 11.5–15.5)
WBC: 10.5 10*3/uL (ref 4.0–10.5)
nRBC: 0 % (ref 0.0–0.2)

## 2020-10-23 MED ORDER — CYCLOBENZAPRINE HCL 5 MG PO TABS: 5.0000 mg | ORAL_TABLET | Freq: Three times a day (TID) | ORAL | 0 refills | Status: DC | PRN

## 2020-10-23 MED ORDER — ORPHENADRINE CITRATE 30 MG/ML IJ SOLN
60.0000 mg | Freq: Once | INTRAMUSCULAR | Status: AC
  Administered 2020-10-23: 60 mg via INTRAVENOUS
  Filled 2020-10-23: qty 2

## 2020-10-23 MED ORDER — PREDNISONE 10 MG PO TABS: 10.0000 mg | ORAL_TABLET | Freq: Every day | ORAL | 0 refills | Status: DC

## 2020-10-23 MED ORDER — HYDROCODONE-ACETAMINOPHEN 5-325 MG PO TABS: 1.0000 | ORAL_TABLET | Freq: Four times a day (QID) | ORAL | 0 refills | Status: DC | PRN

## 2020-10-23 MED ORDER — HYDROMORPHONE HCL 1 MG/ML IJ SOLN
1.0000 mg | Freq: Once | INTRAMUSCULAR | Status: AC
  Administered 2020-10-23: 1 mg via INTRAVENOUS
  Filled 2020-10-23: qty 1

## 2020-10-23 MED ORDER — DEXAMETHASONE SODIUM PHOSPHATE 10 MG/ML IJ SOLN
10.0000 mg | Freq: Once | INTRAMUSCULAR | Status: AC
  Administered 2020-10-23: 10 mg via INTRAVENOUS
  Filled 2020-10-23: qty 1
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
## 2020-10-23 NOTE — ED Notes (Signed)
Pt still at CT/xray
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
## 2020-10-23 NOTE — ED Notes (Signed)
Pt expresses hesitation towards bloodwork and IV medication

## 2020-10-23 NOTE — Discharge Instructions (Addendum)
Please take medications as prescribed.  Call orthopedic office or primary care provider's office Monday to schedule follow-up appointment.  Return to the ER for any worsening symptoms or new changes in your health.
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
## 2020-11-16 ENCOUNTER — Ambulatory Visit (INDEPENDENT_AMBULATORY_CARE_PROVIDER_SITE_OTHER): Payer: Medicare Other | Admitting: Family Medicine

## 2020-11-16 ENCOUNTER — Other Ambulatory Visit: Payer: Self-pay

## 2020-11-16 DIAGNOSIS — M545 Low back pain, unspecified: Secondary | ICD-10-CM | POA: Diagnosis not present

## 2020-11-16 DIAGNOSIS — M25562 Pain in left knee: Secondary | ICD-10-CM

## 2020-11-16 DIAGNOSIS — M797 Fibromyalgia: Secondary | ICD-10-CM

## 2020-11-16 DIAGNOSIS — M542 Cervicalgia: Secondary | ICD-10-CM

## 2020-11-16 DIAGNOSIS — M25552 Pain in left hip: Secondary | ICD-10-CM | POA: Diagnosis not present
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
## 2020-11-24 ENCOUNTER — Ambulatory Visit: Payer: Medicare Other | Admitting: Rehabilitative and Restorative Service Providers"

## 2020-11-24 ENCOUNTER — Ambulatory Visit: Payer: Medicare Other | Attending: Family Medicine | Admitting: Physical Therapy

## 2020-11-24 ENCOUNTER — Encounter: Payer: Self-pay | Admitting: Physical Therapy

## 2020-11-24 ENCOUNTER — Other Ambulatory Visit: Payer: Self-pay

## 2020-11-24 DIAGNOSIS — M542 Cervicalgia: Secondary | ICD-10-CM

## 2020-11-24 DIAGNOSIS — R2689 Other abnormalities of gait and mobility: Secondary | ICD-10-CM | POA: Insufficient documentation

## 2020-11-24 DIAGNOSIS — M545 Low back pain, unspecified: Secondary | ICD-10-CM | POA: Insufficient documentation

## 2020-11-24 DIAGNOSIS — M25552 Pain in left hip: Secondary | ICD-10-CM | POA: Diagnosis present

## 2020-11-24 DIAGNOSIS — M25562 Pain in left knee: Secondary | ICD-10-CM

## 2020-11-24 NOTE — Patient Instructions (Signed)
Access Code: [email or web address] URL: [email or web address] Date: 11/24/2020 Prepared by: Rosana Hoes  Exercises Supine Pelvic Tilt - 2-3 x daily - 7 x weekly - 10 reps - 3 seconds hold Supine March - 2-3 x daily - 7 x weekly - 5 reps Bent Knee Fallouts - 2-3 x daily - 7 x weekly - 10 reps Supine Lower Trunk Rotation - 2-3 x daily - 7 x weekly - 5 reps - 5 seconds hold Supine Heel Slide - 2-3 x daily - 7 x weekly - 10 reps Supine Quad Set - 2-3 x daily - 7 x weekly - 10 reps - 3 seconds hold
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
## 2020-11-26 ENCOUNTER — Encounter: Payer: Self-pay | Admitting: Physical Therapy

## 2020-12-03 ENCOUNTER — Ambulatory Visit: Payer: Medicare Other

## 2020-12-03 ENCOUNTER — Other Ambulatory Visit: Payer: Self-pay

## 2020-12-03 DIAGNOSIS — M25552 Pain in left hip: Secondary | ICD-10-CM

## 2020-12-03 DIAGNOSIS — M25562 Pain in left knee: Secondary | ICD-10-CM

## 2020-12-03 DIAGNOSIS — M545 Low back pain, unspecified: Secondary | ICD-10-CM | POA: Diagnosis not present

## 2020-12-03 DIAGNOSIS — M542 Cervicalgia: Secondary | ICD-10-CM

## 2020-12-03 DIAGNOSIS — R2689 Other abnormalities of gait and mobility: Secondary | ICD-10-CM

## 2020-12-03 NOTE — Therapy (Signed)
[facility] [address] [place], [place], [address] Phone: [phone]   Fax:  [phone]  Physical Therapy Treatment  Patient Details  Name: Victoria Guzman MRN: [id number] Date of Birth: 1966/09/02 Referring Provider (PT): Lavada Mesi, MD   Encounter Date: 12/03/2020   PT End of Session - 12/03/20 1813    Visit Number 2    Number of Visits 16    Date for PT Re-Evaluation 01/19/21    Authorization Type U[employer] MCR    Progress Note Due on Visit 10    PT Start Time 1615    PT Stop Time 1730    PT Time Calculation (min) 75 min    Equipment Utilized During Treatment Other (comment)   rollator   Activity Tolerance Patient limited by pain    Behavior During Therapy [facility] for tasks assessed/performed           Past Medical History:  Diagnosis Date  . Anemia   . Anxiety   . Chronic fatigue   . Chronic insomnia 05/31/2016  . Depression   . Family history of adverse reaction to anesthesia    mother difficult to wake up after CABG  . Fibromyalgia   . Hypertension   . Insomnia   . Panic attack   . Stroke (HCC)    tia  . Vitamin D deficiency 05/31/2016    Past Surgical History:  Procedure Laterality Date  . ABDOMINAL HYSTERECTOMY    . COLONOSCOPY WITH PROPOFOL N/A 07/24/2018   Procedure: COLONOSCOPY WITH PROPOFOL;  Surgeon: Toledo, Boykin Nearing, MD;  Location: [facility];  Service: Gastroenterology;  Laterality: N/A;  . ESOPHAGOGASTRODUODENOSCOPY (EGD) WITH PROPOFOL N/A 07/24/2018   Procedure: ESOPHAGOGASTRODUODENOSCOPY (EGD) WITH PROPOFOL;  Surgeon: Toledo, Boykin Nearing, MD;  Location: [facility];  Service: Gastroenterology;  Laterality: N/A;  . LAPAROSCOPIC BILATERAL SALPINGECTOMY Bilateral 01/25/2019   Procedure: LAPAROSCOPIC BILATERAL SALPINGECTOMY;  Surgeon: Schermerhorn, Ihor Austin, MD;  Location: [facility];  Service: Gynecology;  Laterality: Bilateral;  . PARATHYROIDECTOMY    . PARTIAL HYSTERECTOMY    .  TRACHELECTOMY N/A 01/25/2019   Procedure: TRACHELECTOMY, laparoscopic cervical;  Surgeon: Schermerhorn, Ihor Austin, MD;  Location: [facility] ORS;  Service: Gynecology;  Laterality: N/A;    There were no vitals filed for this visit.   Subjective Assessment - 12/03/20 1634    Subjective Pt reports she is doing better. She states this has been her best week with pain and mobility, although she is still in alot of pain. Back pain is central and spreads out to her hips.    Pertinent History Anxiety, HTN, fibromyalgia, history of TIA, depression    Limitations Sitting;Lifting;Standing;Walking;House hold activities    Diagnostic tests Pt provided MRI of L knee: Impression- Probable peripheral tear, lateral meniscus.    Patient Stated Goals Get life back    Currently in Pain? Yes    Pain Score 6     Pain Location Back    Pain Orientation Right;Left;Posterior;Lower    Pain Descriptors / Indicators Aching;Sore;Throbbing;Burning;Tightness    Pain Type Acute pain    Pain Onset More than a month ago    Pain Frequency Constant    Aggravating Factors  Constant pain, movement and walking increase pain    Pain Relieving Factors Maybe the exs    Pain Score 6    Pain Location Hip    Pain Orientation Left    Pain Descriptors / Indicators Sharp;Throbbing;Burning    Pain Type Acute pain    Pain Onset  More than a month ago    Pain Frequency Constant    Aggravating Factors  Walking, standing, lifting leg for bed mobility    Pain Relieving Factors Maybe exs    Pain Score 7    Pain Location Knee    Pain Orientation Left    Pain Descriptors / Indicators Burning;Throbbing;Sharp;Tightness    Pain Type Acute pain    Pain Onset More than a month ago    Pain Frequency Constant    Aggravating Factors  Walking, standing, moving knee    Pain Relieving Factors Nothing                             [facility] Adult PT Treatment/Exercise - 12/03/20 0001      Transfers   Comments Sit t/f supine, pt  completed going to side prior to then rolling supine with the process reversed to return to sitting      Ambulation/Gait   Ambulation/Gait Yes    Ambulation/Gait Assistance 6: Modified independent (Device/Increase time)    Assistive device Rollator   height of rollator was adjusted for more upright posture   Gait Comments Antalgic gait pattern over the L LE      Exercises   Exercises Lumbar;Knee/Hip      Lumbar Exercises: Stretches   Lower Trunk Rotation Limitations 5 x 5 sec with partial range   pt used strap to assist c L LE movement     Lumbar Exercises: Supine   Pelvic Tilt 10 reps;5 seconds    Bent Knee Raise 5 reps   3 sets   Bent Knee Raise Limitations minimal range. Pt used strap to assist c L LE movement    Other Supine Lumbar Exercises Bent knee fall out x 10 each   pt used strap to assist c L LE movement     Knee/Hip Exercises: Supine   Quad Sets 10 reps   5 sec hold   Quad Sets Limitations towel roll under knee    Heel Slides 5 reps    Heel Slides Limitations pt used strap for A      Modalities   Modalities Moist Heat      Moist Heat Therapy   Number Minutes Moist Heat 20 Minutes    Moist Heat Location Lumbar Spine      Manual Therapy   Manual Therapy Soft tissue mobilization    Soft tissue mobilization STM to the lumbar paraspinal. Increased muscle tension noted. Pt tolerated more pressure to this area than on eval.                  PT Education - 12/03/20 1811    Education Details Sleeping positions for support and comfort. Use of strap to assist c L LE    Person(s) Educated Patient    Methods Explanation;Demonstration;Tactile cues;Verbal cues;Handout    Comprehension Verbalized understanding;Returned demonstration;Verbal cues required;Tactile cues required;Need further instruction            PT Short Term Goals - 11/24/20 1758      PT SHORT TERM GOAL #1   Title Patient will be I with initial HEP to progress with PT    Time 4    Period Weeks     Status New    Target Date 12/22/20      PT SHORT TERM GOAL #2   Title Patient will be able to ambulate household distances using LRAD to improve independence with going  to the bathroom    Time 4    Period Weeks    Status New    Target Date 12/22/20      PT SHORT TERM GOAL #3   Title Patient will be able to stand >/= 10 minutes to improve cooking ability    Time 4    Period Weeks    Status New    Target Date 12/22/20      PT SHORT TERM GOAL #4   Title Patient will report </= 6/10 pain level with activity to reduce functional limitation    Time 4    Period Weeks    Status New    Target Date 12/22/20             PT Long Term Goals - 11/24/20 1758      PT LONG TERM GOAL #1   Title Patient will be I with final HEP to maintain progress from PT    Time 8    Period Weeks    Status New    Target Date 01/19/21      PT LONG TERM GOAL #2   Title Patient will be able to ambulate community level distance with LRAD in order to perform shopping tasks    Time 8    Period Weeks    Status New    Target Date 01/19/21      PT LONG TERM GOAL #3   Title Patient will be able to perform all bathing, dressing, and grooming tasks without assistance    Time 8    Period Weeks    Status New    Target Date 01/19/21      PT LONG TERM GOAL #4   Title Patient will report </= 3/10 pain level with activity to reduce functional limitation    Time 8    Period Weeks    Status New    Target Date 01/19/21      PT LONG TERM GOAL #5   Title Patient will report improved functional status >/= 49% on FOTO    Time 8    Period Weeks    Status New    Target Date 01/19/21                 Plan - 12/03/20 1615    Clinical Impression Statement Education was provided for sleeping postions and support. Height of the rollator was raised and pt walked with a more upright posture. Pt was completed to [facility] mobilization to the lumbar paraspinals. Paraspinals demonstrated tightness. Pt was able  tolerated increased pressure to this area vs. on the initial eval. Reviewed and pt completed ther ex to promote core strengthening and mobility of the low back. A strap to assist with the L LE was beneficial for pt to move the L LE with greater ease related to L knee pain. Overall, pt's mobility was completed with greater ease which was as an indication of decreased pain. Pt will benefit from continued PT for pain reduction and to improve functional mobility.    Personal Factors and Comorbidities Past/Current Experience;Time since onset of injury/illness/exacerbation;Comorbidity 3+;Fitness    Comorbidities Anxiety, depression, fibromyalgia    Examination-Activity Limitations Locomotion Level;Transfers;Bed Mobility;Bend;Carry;Dressing;Hygiene/Grooming;Lift;Stand;Stairs;Squat;Sleep;Sit    Examination-Participation Restrictions Meal Prep;Cleaning;Community Activity;Driving;Shop;Laundry;Yard Work    Conservation officer, historic buildings Evolving/Moderate complexity    Clinical Decision Making Moderate    Rehab Potential Good    PT Frequency 2x / week    PT Duration 8 weeks    PT Treatment/Interventions ADLs/Self  Care Home Management;Cryotherapy;Electrical Stimulation;Iontophoresis 4mg /ml Dexamethasone;Moist Heat;Ultrasound;Traction;Neuromuscular re-education;Balance training;Therapeutic exercise;Therapeutic activities;Functional mobility training;Stair training;Gait training;Patient/family education;Manual techniques;Dry needling;Passive range of motion;Taping;Spinal Manipulations;Joint Manipulations    PT Next Visit Plan Review HEP and progress PRN, manual and modalities for pain reduction, progress lumbar/hip/knee motion, core activation, gait training    PT Home Exercise Plan [email or web address]    Consulted and Agree with Plan of Care Patient           Patient will benefit from skilled therapeutic intervention in order to improve the following deficits and impairments:  Abnormal gait,Decreased range of  motion,Difficulty walking,Pain,Decreased activity tolerance,Postural dysfunction,Decreased strength,Decreased mobility  Visit Diagnosis: Acute bilateral low back pain, unspecified whether sciatica present  Pain in left hip  Acute pain of left knee  Other abnormalities of gait and mobility  Cervicalgia     Problem List Patient Active Problem List   Diagnosis Date Noted  . Post-operative state 01/25/2019  . ARF (acute renal failure) (HCC) 04/24/2018  . Acute renal failure (ARF) (HCC) 04/22/2018  . Hypercalcemia 05/31/2016  . Hypokalemia 05/31/2016  . Hypochloremia 05/31/2016  . Screening for HIV (human immunodeficiency virus) 05/31/2016  . Screen for STD (sexually transmitted disease) 05/31/2016  . Vitamin D deficiency 05/31/2016  . Chronic insomnia 05/31/2016  . Absolute anemia 07/07/2015  . Clinical depression 07/07/2015  . Essential hypertension 07/07/2015  . Acute anxiety 07/07/2015  . Primary fibromyalgia syndrome 07/07/2015  . Embolic stroke (HCC) 07/17/2014    Joellyn RuedAllen Elimelech Houseman MS, PT 12/03/20 6:36 PM  [facility] [address] [place], [place], [id number] Phone: [phone]   Fax:  [phone]  Name: Charna BusmanMelissa L Basnett MRN: [id number] Date of Birth: 11-19-1966

## 2020-12-10 ENCOUNTER — Other Ambulatory Visit: Payer: Self-pay

## 2020-12-10 ENCOUNTER — Ambulatory Visit: Payer: Medicare Other | Attending: Family Medicine | Admitting: Physical Therapy

## 2020-12-10 DIAGNOSIS — M545 Low back pain, unspecified: Secondary | ICD-10-CM | POA: Insufficient documentation

## 2020-12-10 DIAGNOSIS — M542 Cervicalgia: Secondary | ICD-10-CM | POA: Diagnosis present

## 2020-12-10 DIAGNOSIS — R2689 Other abnormalities of gait and mobility: Secondary | ICD-10-CM | POA: Insufficient documentation

## 2020-12-10 DIAGNOSIS — M25552 Pain in left hip: Secondary | ICD-10-CM | POA: Diagnosis present

## 2020-12-10 DIAGNOSIS — M25562 Pain in left knee: Secondary | ICD-10-CM | POA: Diagnosis present

## 2020-12-11 ENCOUNTER — Encounter: Payer: Self-pay | Admitting: Physical Therapy

## 2020-12-11 NOTE — Therapy (Signed)
[facility] [place], [place], [address] Phone: [phone]   Fax:  [phone]  Physical Therapy Treatment  Patient Details  Name: Victoria Guzman MRN: [id number] Date of Birth: 1966/03/12 Referring Provider (PT): Eunice Blase, MD   Encounter Date: 12/10/2020   PT End of Session - 12/11/20 0940    Visit Number 4    Number of Visits 16    Date for PT Re-Evaluation 01/19/21    Authorization Type UHC MCR    Progress Note Due on Visit 10    PT Start Time 7035   19 minutes late to start session due to pt. arriving late   PT Stop Time 1814   10 min for MHP not included in direct tx. minutes   PT Time Calculation (min) 40 min    Equipment Utilized During Treatment Other (comment)   rollator   Activity Tolerance Patient limited by pain           Past Medical History:  Diagnosis Date  . Anemia   . Anxiety   . Chronic fatigue   . Chronic insomnia 05/31/2016  . Depression   . Family history of adverse reaction to anesthesia    mother difficult to wake up after CABG  . Fibromyalgia   . Hypertension   . Insomnia   . Panic attack   . Stroke ([place])    tia  . Vitamin D deficiency 05/31/2016    Past Surgical History:  Procedure Laterality Date  . ABDOMINAL HYSTERECTOMY    . COLONOSCOPY WITH PROPOFOL N/A 07/24/2018   Procedure: COLONOSCOPY WITH PROPOFOL;  Surgeon: Toledo, Benay Pike, MD;  Location: [facility];  Service: Gastroenterology;  Laterality: N/A;  . ESOPHAGOGASTRODUODENOSCOPY (EGD) WITH PROPOFOL N/A 07/24/2018   Procedure: ESOPHAGOGASTRODUODENOSCOPY (EGD) WITH PROPOFOL;  Surgeon: Toledo, Benay Pike, MD;  Location: [facility];  Service: Gastroenterology;  Laterality: N/A;  . LAPAROSCOPIC BILATERAL SALPINGECTOMY Bilateral 01/25/2019   Procedure: LAPAROSCOPIC BILATERAL SALPINGECTOMY;  Surgeon: Schermerhorn, Gwen Her, MD;  Location: [facility];  Service: Gynecology;  Laterality: Bilateral;  .  PARATHYROIDECTOMY    . PARTIAL HYSTERECTOMY    . TRACHELECTOMY N/A 01/25/2019   Procedure: TRACHELECTOMY, laparoscopic cervical;  Surgeon: Schermerhorn, Gwen Her, MD;  Location: [facility];  Service: Gynecology;  Laterality: N/A;    There were no vitals filed for this visit.   Subjective Assessment - 12/11/20 0930    Subjective Session today abbreviated due to pt. arriving late coming to therapy following chiropractic appointment. She arrives tearful with continued diffuse pain include lumbar and neck as well as knee pain. She reports her chiropractor had sent some previous imaging to a radiologist for interpretation with findings of "2 disc bulges" in her back. She also reports meniscus tear in her knee (copy of MRI report for this provided at previous therapy session). Requested pt. to bring copy of report for back to next therapy appointment for review/to update chart. She continues with rollator use for mobility.    Pertinent History Anxiety, HTN, fibromyalgia, history of TIA, depression    Limitations Sitting;Lifting;Standing;Walking;House hold activities    Currently in Pain? Yes    Pain Score 7     Pain Location Back    Pain Orientation Right;Left;Lower    Pain Descriptors / Indicators Aching;Sore;Tightness;Throbbing;Burning    Pain Type Acute pain    Pain Onset More than a month ago    Pain Frequency Constant    Aggravating Factors  movement and walking    Pain  Relieving Factors possible mild ease with exercises but limited eases noted    Effect of Pain on Daily Activities limits activity and mobility tolerance, uses rollator to ambulate    Pain Score 7    Pain Location Hip    Pain Orientation Left    Pain Descriptors / Indicators Sharp;Throbbing;Burning    Pain Type Acute pain    Pain Onset More than a month ago    Pain Frequency Constant    Aggravating Factors  walking, standing, lifting leg for sit<>supine    Pain Relieving Factors maybe exercsies    Pain Score 7    Pain  Location Knee    Pain Orientation Left    Pain Descriptors / Indicators Burning;Throbbing;Sharp;Tightness    Pain Type Acute pain    Pain Onset More than a month ago    Pain Frequency Constant    Aggravating Factors  walking, standing, moving knee    Pain Relieving Factors no eases noted    Effect of Pain on Daily Activities limits standing and walking tolerance                             [facility] Adult PT Treatment/Exercise - 12/11/20 0001      Lumbar Exercises: Stretches   Lower Trunk Rotation Limitations 10 reps ea. way bilat.    Pelvic Tilt 10 reps      Moist Heat Therapy   Number Minutes Moist Heat 10 Minutes    Moist Heat Location Lumbar Spine      Manual Therapy   Soft tissue mobilization STM to lumbar paraspinals in right sidelying with pillow between knees-focus left side muscles                  PT Education - 12/11/20 0939    Education Details bring copy of imaging report for low back re: her reporting findings of disc bulges, pain education/symptom etiology, tissue healing timeframe, "hurt vs. harm" with activity    Person(s) Educated Patient    Methods Explanation    Comprehension Verbalized understanding            PT Short Term Goals - 11/24/20 1758      PT SHORT TERM GOAL #1   Title Patient will be I with initial HEP to progress with PT    Time 4    Period Weeks    Status New    Target Date 12/22/20      PT SHORT TERM GOAL #2   Title Patient will be able to ambulate household distances using LRAD to improve independence with going to the bathroom    Time 4    Period Weeks    Status New    Target Date 12/22/20      PT SHORT TERM GOAL #3   Title Patient will be able to stand >/= 10 minutes to improve cooking ability    Time 4    Period Weeks    Status New    Target Date 12/22/20      PT SHORT TERM GOAL #4   Title Patient will report </= 6/10 pain level with activity to reduce functional limitation    Time 4    Period  Weeks    Status New    Target Date 12/22/20             PT Long Term Goals - 11/24/20 1758      PT LONG TERM GOAL #  1   Title Patient will be I with final HEP to maintain progress from PT    Time 8    Period Weeks    Status New    Target Date 01/19/21      PT LONG TERM GOAL #2   Title Patient will be able to ambulate community level distance with LRAD in order to perform shopping tasks    Time 8    Period Weeks    Status New    Target Date 01/19/21      PT LONG TERM GOAL #3   Title Patient will be able to perform all bathing, dressing, and grooming tasks without assistance    Time 8    Period Weeks    Status New    Target Date 01/19/21      PT LONG TERM GOAL #4   Title Patient will report </= 3/10 pain level with activity to reduce functional limitation    Time 8    Period Weeks    Status New    Target Date 01/19/21      PT LONG TERM GOAL #5   Title Patient will report improved functional status >/= 49% on FOTO    Time 8    Period Weeks    Status New    Target Date 01/19/21                 Plan - 12/11/20 0941    Clinical Impression Statement Pt. continues with high pain level with diffuse pain as noted in subjective and continued significant limitations of activity tolerance/limited exercise tolerance due to pain. Tolerance for gentle manual therapy with STM improving from previous status with less tenderness but otherwise therapy goals still ongoing. Session was abbreviated as noted in subjective so focus gentle manual due to limited activity tolerance along with brief/gentle stretches. Plan continue therapy for further work to help decrease pain and address associated functional limitations for mobility.    Personal Factors and Comorbidities Past/Current Experience;Time since onset of injury/illness/exacerbation;Comorbidity 3+;Fitness    Comorbidities Anxiety, depression, fibromyalgia    Examination-Activity Limitations Locomotion Level;Transfers;Bed  Mobility;Bend;Carry;Dressing;Hygiene/Grooming;Lift;Stand;Stairs;Squat;Sleep;Sit    Examination-Participation Restrictions Meal Prep;Cleaning;Community Activity;Driving;Shop;Laundry;Yard Work    Conservation officer, historic buildings Evolving/Moderate complexity    Clinical Decision Making Moderate    Rehab Potential Good    PT Frequency 2x / week    PT Duration 8 weeks    PT Treatment/Interventions ADLs/Self Care Home Management;Cryotherapy;Electrical Stimulation;Iontophoresis 4mg /ml Dexamethasone;Moist Heat;Ultrasound;Traction;Neuromuscular re-education;Balance training;Therapeutic exercise;Therapeutic activities;Functional mobility training;Stair training;Gait training;Patient/family education;Manual techniques;Dry needling;Passive range of motion;Taping;Spinal Manipulations;Joint Manipulations    PT Next Visit Plan Check imaging report for lumbar spine if brought by pt./update chart as needed, pending tolerance continue gentle exercise progression for ROM, stretches, manual as tolerated, functional mobility for transfers and gait, modalities prn for pain    PT Home Exercise Plan [email or web address]    Consulted and Agree with Plan of Care Patient           Patient will benefit from skilled therapeutic intervention in order to improve the following deficits and impairments:  Abnormal gait,Decreased range of motion,Difficulty walking,Pain,Decreased activity tolerance,Postural dysfunction,Decreased strength,Decreased mobility  Visit Diagnosis: Acute bilateral low back pain, unspecified whether sciatica present  Pain in left hip  Acute pain of left knee  Other abnormalities of gait and mobility  Cervicalgia     Problem List Patient Active Problem List   Diagnosis Date Noted  . Post-operative state 01/25/2019  . ARF (acute renal failure) (HCC) 04/24/2018  .  Acute renal failure (ARF) (HCC) 04/22/2018  . Hypercalcemia 05/31/2016  . Hypokalemia 05/31/2016  . Hypochloremia 05/31/2016  . Screening  for HIV (human immunodeficiency virus) 05/31/2016  . Screen for STD (sexually transmitted disease) 05/31/2016  . Vitamin D deficiency 05/31/2016  . Chronic insomnia 05/31/2016  . Absolute anemia 07/07/2015  . Clinical depression 07/07/2015  . Essential hypertension 07/07/2015  . Acute anxiety 07/07/2015  . Primary fibromyalgia syndrome 07/07/2015  . Embolic stroke (HCC) 07/17/2014    Lazarus Gowda, PT, DPT 12/11/20 9:47 AM  [facility] [address] [place], [place], [address] Phone: [phone]   Fax:  [phone]  Name: MARYSA WESSNER MRN: [id number] Date of Birth: 11-Nov-1966

## 2020-12-14 ENCOUNTER — Ambulatory Visit (INDEPENDENT_AMBULATORY_CARE_PROVIDER_SITE_OTHER): Payer: Medicare Other | Admitting: Family Medicine

## 2020-12-14 ENCOUNTER — Encounter: Payer: Self-pay | Admitting: Family Medicine

## 2020-12-14 ENCOUNTER — Other Ambulatory Visit: Payer: Self-pay

## 2020-12-14 DIAGNOSIS — M545 Low back pain, unspecified: Secondary | ICD-10-CM

## 2020-12-14 DIAGNOSIS — M25552 Pain in left hip: Secondary | ICD-10-CM | POA: Diagnosis not present

## 2020-12-14 DIAGNOSIS — M25562 Pain in left knee: Secondary | ICD-10-CM

## 2020-12-14 MED ORDER — IBUPROFEN 800 MG PO TABS: 800.0000 mg | ORAL_TABLET | Freq: Three times a day (TID) | ORAL | 3 refills | Status: DC | PRN

## 2020-12-14 MED ORDER — MELOXICAM 7.5 MG PO TABS: 7.5000 mg | ORAL_TABLET | Freq: Every day | ORAL | 3 refills | Status: DC
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
## 2020-12-14 NOTE — Addendum Note (Signed)
Addended by: Rip Harbour on: 12/14/2020 04:44 PM   Modules accepted: Orders

## 2020-12-14 NOTE — Addendum Note (Signed)
Addended by: Lillia Carmel on: 12/14/2020 04:19 PM   Modules accepted: Orders

## 2020-12-15 ENCOUNTER — Ambulatory Visit: Payer: Medicare Other | Admitting: Physical Therapy

## 2020-12-15 DIAGNOSIS — M25552 Pain in left hip: Secondary | ICD-10-CM

## 2020-12-15 DIAGNOSIS — R2689 Other abnormalities of gait and mobility: Secondary | ICD-10-CM

## 2020-12-15 DIAGNOSIS — M25562 Pain in left knee: Secondary | ICD-10-CM

## 2020-12-15 DIAGNOSIS — M545 Low back pain, unspecified: Secondary | ICD-10-CM

## 2020-12-15 DIAGNOSIS — M542 Cervicalgia: Secondary | ICD-10-CM

## 2020-12-15 NOTE — Therapy (Signed)
[facility] [address] [place], [place], [address] Phone: [phone]   Fax:  [phone]  Physical Therapy Treatment  Patient Details  Name: Victoria Guzman MRN: [id number] Date of Birth: [birth year] Referring Provider (PT): Lavada Mesi, MD   Encounter Date: 12/15/2020   PT End of Session - 12/15/20 2144    Visit Number 5    Number of Visits 16    Date for PT Re-Evaluation 01/19/21    Authorization Type UHC MCR    Progress Note Due on Visit 10    PT Start Time 1718    PT Stop Time 1815    PT Time Calculation (min) 57 min    Activity Tolerance Patient limited by pain    Behavior During Therapy [facility] for tasks assessed/performed           Past Medical History:  Diagnosis Date  . Anemia   . Anxiety   . Chronic fatigue   . Chronic insomnia 05/31/2016  . Depression   . Family history of adverse reaction to anesthesia    mother difficult to wake up after CABG  . Fibromyalgia   . Hypertension   . Insomnia   . Panic attack   . Stroke ([facility])    tia  . Vitamin D deficiency 05/31/2016    Past Surgical History:  Procedure Laterality Date  . ABDOMINAL HYSTERECTOMY    . COLONOSCOPY WITH PROPOFOL N/A 07/24/2018   Procedure: COLONOSCOPY WITH PROPOFOL;  Surgeon: Toledo, Boykin Nearing, MD;  Location: [facility];  Service: Gastroenterology;  Laterality: N/A;  . ESOPHAGOGASTRODUODENOSCOPY (EGD) WITH PROPOFOL N/A 07/24/2018   Procedure: ESOPHAGOGASTRODUODENOSCOPY (EGD) WITH PROPOFOL;  Surgeon: Toledo, Boykin Nearing, MD;  Location: [facility];  Service: Gastroenterology;  Laterality: N/A;  . LAPAROSCOPIC BILATERAL SALPINGECTOMY Bilateral 01/25/2019   Procedure: LAPAROSCOPIC BILATERAL SALPINGECTOMY;  Surgeon: Schermerhorn, Ihor Austin, MD;  Location: [facility];  Service: Gynecology;  Laterality: Bilateral;  . PARATHYROIDECTOMY    . PARTIAL HYSTERECTOMY    . TRACHELECTOMY N/A 01/25/2019   Procedure: TRACHELECTOMY, laparoscopic cervical;   Surgeon: Schermerhorn, Ihor Austin, MD;  Location: [facility];  Service: Gynecology;  Laterality: N/A;    There were no vitals filed for this visit.   Subjective Assessment - 12/15/20 1722    Subjective Pt. had MD follow up and MRI has been ordered for lumbar spine for further evaluation of disc bulges noted from CT report and was also prescribed meloxicam. Pain about a 5/10 today. Pt. wishing to work on neck with PT today as well. She continues to attend chiropractic visits as well.    Pertinent History Anxiety, HTN, fibromyalgia, history of TIA, depression    Limitations Sitting;Lifting;Standing;Walking;House hold activities              [facility] PT Assessment - 12/15/20 0001      AROM   AROM Assessment Site Cervical    Cervical Flexion 35    Cervical Extension 25    Cervical - Right Rotation 50    Cervical - Left Rotation 38                         [facility] Adult PT Treatment/Exercise - 12/15/20 0001      Therapeutic Activites    Therapeutic Activities --   review logrolling technique     Exercises   Exercises Neck      Neck Exercises: Seated   Neck Retraction 10 reps    Cervical Rotation 5 reps;Left;Right  Other Seated Exercise gentle cervical flexion and extension AROM x 5 ea., scapular retractions x 10 reps      Neck Exercises: Supine   Neck Retraction 10 reps      Moist Heat Therapy   Number Minutes Moist Heat 10 Minutes    Moist Heat Location Lumbar Spine;Cervical   in sitting     Manual Therapy   Manual therapy comments gentle suboccipital release and cervical manual traction    Soft tissue mobilization Attempted STM to left lumbar region but held due to limited tolerance from pain, gentle foam roll left posterolateral hip      Neck Exercises: Stretches   Upper Trapezius Stretch Limitations gentle self stretch 10-20 sec holds x 2 ea. bilat.                  PT Education - 12/15/20 2142    Education Details pain education-hurt vs. harm,  logrolling, exercises/HEP updates    Person(s) Educated Patient    Methods Explanation;Demonstration;Verbal cues;Handout    Comprehension Verbalized understanding;Returned demonstration;Verbal cues required            PT Short Term Goals - 11/24/20 1758      PT SHORT TERM GOAL #1   Title Patient will be I with initial HEP to progress with PT    Time 4    Period Weeks    Status New    Target Date 12/22/20      PT SHORT TERM GOAL #2   Title Patient will be able to ambulate household distances using LRAD to improve independence with going to the bathroom    Time 4    Period Weeks    Status New    Target Date 12/22/20      PT SHORT TERM GOAL #3   Title Patient will be able to stand >/= 10 minutes to improve cooking ability    Time 4    Period Weeks    Status New    Target Date 12/22/20      PT SHORT TERM GOAL #4   Title Patient will report </= 6/10 pain level with activity to reduce functional limitation    Time 4    Period Weeks    Status New    Target Date 12/22/20             PT Long Term Goals - 11/24/20 1758      PT LONG TERM GOAL #1   Title Patient will be I with final HEP to maintain progress from PT    Time 8    Period Weeks    Status New    Target Date 01/19/21      PT LONG TERM GOAL #2   Title Patient will be able to ambulate community level distance with LRAD in order to perform shopping tasks    Time 8    Period Weeks    Status New    Target Date 01/19/21      PT LONG TERM GOAL #3   Title Patient will be able to perform all bathing, dressing, and grooming tasks without assistance    Time 8    Period Weeks    Status New    Target Date 01/19/21      PT LONG TERM GOAL #4   Title Patient will report </= 3/10 pain level with activity to reduce functional limitation    Time 8    Period Weeks    Status New  Target Date 01/19/21      PT LONG TERM GOAL #5   Title Patient will report improved functional status >/= 49% on FOTO    Time 8     Period Weeks    Status New    Target Date 01/19/21                 Plan - 12/15/20 2144    Clinical Impression Statement Still with difficulty tolerating STM with diffuse TTP and hypersensitivity. Checked cervical ROM with stiffness noted in left>right rotation. Patient wishing to include tx. to neck so added gentle ROM and manual therapy as noted per flowsheet with fair tolerance due to pain. Plan continue POC and await MRI results to investigate for further lumbar pathology.    Personal Factors and Comorbidities Past/Current Experience;Time since onset of injury/illness/exacerbation;Comorbidity 3+;Fitness    Comorbidities Anxiety, depression, fibromyalgia    Examination-Activity Limitations Locomotion Level;Transfers;Bed Mobility;Bend;Carry;Dressing;Hygiene/Grooming;Lift;Stand;Stairs;Squat;Sleep;Sit    Examination-Participation Restrictions Meal Prep;Cleaning;Community Activity;Driving;Shop;Laundry;Yard Work    Conservation officer, historic buildings Evolving/Moderate complexity    Clinical Decision Making Moderate    Rehab Potential Good    PT Frequency 2x / week    PT Duration 6 weeks    PT Treatment/Interventions ADLs/Self Care Home Management;Cryotherapy;Electrical Stimulation;Iontophoresis 4mg /ml Dexamethasone;Moist Heat;Ultrasound;Traction;Neuromuscular re-education;Balance training;Therapeutic exercise;Therapeutic activities;Functional mobility training;Stair training;Gait training;Patient/family education;Manual techniques;Dry needling;Passive range of motion;Taping;Spinal Manipulations;Joint Manipulations    PT Next Visit Plan Recheck FOTO, More focus lumbar exercises next session, possible trial NUSTEP, continue gentle manual to cervical region and lumbar + hip region as tolerated    PT Home Exercise Plan [email or web address]    Consulted and Agree with Plan of Care Patient           Patient will benefit from skilled therapeutic intervention in order to improve the following deficits and  impairments:  Abnormal gait,Decreased range of motion,Difficulty walking,Pain,Decreased activity tolerance,Postural dysfunction,Decreased strength,Decreased mobility  Visit Diagnosis: Acute bilateral low back pain, unspecified whether sciatica present  Pain in left hip  Acute pain of left knee  Other abnormalities of gait and mobility  Cervicalgia     Problem List Patient Active Problem List   Diagnosis Date Noted  . Post-operative state 01/25/2019  . ARF (acute renal failure) (HCC) 04/24/2018  . Acute renal failure (ARF) (HCC) 04/22/2018  . Hypercalcemia 05/31/2016  . Hypokalemia 05/31/2016  . Hypochloremia 05/31/2016  . Screening for HIV (human immunodeficiency virus) 05/31/2016  . Screen for STD (sexually transmitted disease) 05/31/2016  . Vitamin D deficiency 05/31/2016  . Chronic insomnia 05/31/2016  . Absolute anemia 07/07/2015  . Clinical depression 07/07/2015  . Essential hypertension 07/07/2015  . Acute anxiety 07/07/2015  . Primary fibromyalgia syndrome 07/07/2015  . Embolic stroke (HCC) 07/17/2014   07/19/2014, PT, DPT 12/15/20 9:51 PM  [facility] [address] [place], [place], [place] Phone: [phone]   Fax:  [phone]  Name: CLOTILDE LOTH MRN: Charna Busman Date of Birth: 10-Mar-1966

## 2020-12-17 ENCOUNTER — Encounter: Payer: Self-pay | Admitting: Physical Therapy

## 2020-12-17 ENCOUNTER — Ambulatory Visit: Payer: Medicare Other | Admitting: Physical Therapy

## 2020-12-17 ENCOUNTER — Other Ambulatory Visit: Payer: Self-pay

## 2020-12-17 DIAGNOSIS — M542 Cervicalgia: Secondary | ICD-10-CM

## 2020-12-17 DIAGNOSIS — M25552 Pain in left hip: Secondary | ICD-10-CM

## 2020-12-17 DIAGNOSIS — R2689 Other abnormalities of gait and mobility: Secondary | ICD-10-CM

## 2020-12-17 DIAGNOSIS — M25562 Pain in left knee: Secondary | ICD-10-CM

## 2020-12-17 DIAGNOSIS — M545 Low back pain, unspecified: Secondary | ICD-10-CM

## 2020-12-17 NOTE — Therapy (Signed)
[place], [place], [address] Phone: [phone]   Fax:  [phone]  Physical Therapy Treatment  Patient Details  Name: Victoria Guzman MRN: [id number] Date of Birth: [birth year] Referring Provider (PT): Eunice Blase, MD   Encounter Date: 12/17/2020   PT End of Session - 12/17/20 1809    Visit Number 6    Number of Visits 16    Date for PT Re-Evaluation 01/19/21    Authorization Type UHC MCR    Progress Note Due on Visit 10    PT Start Time 1720    PT Stop Time 1815    PT Time Calculation (min) 55 min    Activity Tolerance Patient limited by pain    Behavior During Therapy [facility] for tasks assessed/performed           Past Medical History:  Diagnosis Date  . Anemia   . Anxiety   . Chronic fatigue   . Chronic insomnia 05/31/2016  . Depression   . Family history of adverse reaction to anesthesia    mother difficult to wake up after CABG  . Fibromyalgia   . Hypertension   . Insomnia   . Panic attack   . Stroke ([place])    tia  . Vitamin D deficiency 05/31/2016    Past Surgical History:  Procedure Laterality Date  . ABDOMINAL HYSTERECTOMY    . COLONOSCOPY WITH PROPOFOL N/A 07/24/2018   Procedure: COLONOSCOPY WITH PROPOFOL;  Surgeon: Toledo, Benay Pike, MD;  Location: [facility];  Service: Gastroenterology;  Laterality: N/A;  . ESOPHAGOGASTRODUODENOSCOPY (EGD) WITH PROPOFOL N/A 07/24/2018   Procedure: ESOPHAGOGASTRODUODENOSCOPY (EGD) WITH PROPOFOL;  Surgeon: Toledo, Benay Pike, MD;  Location: [facility];  Service: Gastroenterology;  Laterality: N/A;  . LAPAROSCOPIC BILATERAL SALPINGECTOMY Bilateral 01/25/2019   Procedure: LAPAROSCOPIC BILATERAL SALPINGECTOMY;  Surgeon: Schermerhorn, Gwen Her, MD;  Location: [facility];  Service: Gynecology;  Laterality: Bilateral;  . PARATHYROIDECTOMY    . PARTIAL HYSTERECTOMY    . TRACHELECTOMY N/A 01/25/2019   Procedure: TRACHELECTOMY, laparoscopic cervical;   Surgeon: Schermerhorn, Gwen Her, MD;  Location: [facility];  Service: Gynecology;  Laterality: N/A;    There were no vitals filed for this visit.   Subjective Assessment - 12/17/20 1721    Subjective Pain 6/10 this PM mostly in lower back>neck this PM. She reports chiropractor trying new technique using machine to help with pain (uncertain name-she reports "MMR" or "MMT" machine.    Pertinent History Anxiety, HTN, fibromyalgia, history of TIA, depression              [facility] PT Assessment - 12/17/20 0001      Observation/Other Assessments   Focus on Therapeutic Outcomes (FOTO)  29% functional status                         [facility] Adult PT Treatment/Exercise - 12/17/20 0001      Lumbar Exercises: Stretches   Single Knee to Chest Stretch Limitations gentle supine ROM assisted by therapist x 8 reps ea. bilat.    Lower Trunk Rotation Limitations partial AROM x 15 times ea. way brief holds      Lumbar Exercises: Aerobic   Nustep L1 x 7 UE/LE   partial ROM to tolerance     Lumbar Exercises: Seated   Other Seated Lumbar Exercises pelvic tilt x15 reps, diaphragmatic breathing x 10 reps      Moist Heat Therapy   Number Minutes Moist Heat  10 Minutes    Moist Heat Location Lumbar Spine   in prone     Manual Therapy   Soft tissue mobilization STM and MFR bilateral lumbar paraspinals in prone with pillow under shins                  PT Education - 12/17/20 1808    Education Details pain education    Person(s) Educated Patient    Methods Explanation    Comprehension Verbalized understanding            PT Short Term Goals - 11/24/20 1758      PT SHORT TERM GOAL #1   Title Patient will be I with initial HEP to progress with PT    Time 4    Period Weeks    Status New    Target Date 12/22/20      PT SHORT TERM GOAL #2   Title Patient will be able to ambulate household distances using LRAD to improve independence with going to the bathroom    Time 4     Period Weeks    Status New    Target Date 12/22/20      PT SHORT TERM GOAL #3   Title Patient will be able to stand >/= 10 minutes to improve cooking ability    Time 4    Period Weeks    Status New    Target Date 12/22/20      PT SHORT TERM GOAL #4   Title Patient will report </= 6/10 pain level with activity to reduce functional limitation    Time 4    Period Weeks    Status New    Target Date 12/22/20             PT Long Term Goals - 11/24/20 1758      PT LONG TERM GOAL #1   Title Patient will be I with final HEP to maintain progress from PT    Time 8    Period Weeks    Status New    Target Date 01/19/21      PT LONG TERM GOAL #2   Title Patient will be able to ambulate community level distance with LRAD in order to perform shopping tasks    Time 8    Period Weeks    Status New    Target Date 01/19/21      PT LONG TERM GOAL #3   Title Patient will be able to perform all bathing, dressing, and grooming tasks without assistance    Time 8    Period Weeks    Status New    Target Date 01/19/21      PT LONG TERM GOAL #4   Title Patient will report </= 3/10 pain level with activity to reduce functional limitation    Time 8    Period Weeks    Status New    Target Date 01/19/21      PT LONG TERM GOAL #5   Title Patient will report improved functional status >/= 49% on FOTO    Time 8    Period Weeks    Status New    Target Date 01/19/21                 Plan - 12/17/20 1933    Clinical Impression Statement Tx. focus lumbar region today as primary pain complaint this PM. Mild improvement in activity tolerance from previous visits with ability to progress with NUSTEP today  for warm up but overall mobility status still limited by high pain level. Included manual with STM lumbar region with pt. able to tolerate prone positioning for this with tightness/muscle spasm noted in left>right paraspinals. Discussed possible dry needling at future visit pending  tolerance to help address myofascial pain. Still pending MRI for lumbar region as noted last visit to assess for any underlying spinal pathology for pain symptoms.    Personal Factors and Comorbidities Past/Current Experience;Time since onset of injury/illness/exacerbation;Comorbidity 3+;Fitness    Comorbidities Anxiety, depression, fibromyalgia    Examination-Activity Limitations Locomotion Level;Transfers;Bed Mobility;Bend;Carry;Dressing;Hygiene/Grooming;Lift;Stand;Stairs;Squat;Sleep;Sit    Examination-Participation Restrictions Meal Prep;Cleaning;Community Activity;Driving;Shop;Laundry;Yard Work    Merchant navy officer Evolving/Moderate complexity    Clinical Decision Making Moderate    Rehab Potential Good    PT Frequency 2x / week    PT Duration 6 weeks    PT Treatment/Interventions ADLs/Self Care Home Management;Cryotherapy;Electrical Stimulation;Iontophoresis 48m/ml Dexamethasone;Moist Heat;Ultrasound;Traction;Neuromuscular re-education;Balance training;Therapeutic exercise;Therapeutic activities;Functional mobility training;Stair training;Gait training;Patient/family education;Manual techniques;Dry needling;Passive range of motion;Taping;Spinal Manipulations;Joint Manipulations    PT Next Visit Plan Continue exercise progession as tolerated, manual therapy for STM lumbar region vs. gentle manual cervical region, modalities prn    PT Home Exercise Plan [email or web address]    Consulted and Agree with Plan of Care Patient           Patient will benefit from skilled therapeutic intervention in order to improve the following deficits and impairments:  Abnormal gait,Decreased range of motion,Difficulty walking,Pain,Decreased activity tolerance,Postural dysfunction,Decreased strength,Decreased mobility  Visit Diagnosis: Acute bilateral low back pain, unspecified whether sciatica present  Pain in left hip  Acute pain of left knee  Other abnormalities of gait and  mobility  Cervicalgia     Problem List Patient Active Problem List   Diagnosis Date Noted  . Post-operative state 01/25/2019  . ARF (acute renal failure) ([place] 04/24/2018  . Acute renal failure (ARF) ([place] 04/22/2018  . Hypercalcemia 05/31/2016  . Hypokalemia 05/31/2016  . Hypochloremia 05/31/2016  . Screening for HIV (human immunodeficiency virus) 05/31/2016  . Screen for STD (sexually transmitted disease) 05/31/2016  . Vitamin D deficiency 05/31/2016  . Chronic insomnia 05/31/2016  . Absolute anemia 07/07/2015  . Clinical depression 07/07/2015  . Essential hypertension 07/07/2015  . Acute anxiety 07/07/2015  . Primary fibromyalgia syndrome 07/07/2015  . Embolic stroke ([place] [id number]  CBeaulah Dinning PT, DPT 12/17/20 7:42 PM  [facility]GGarden [place] [address]Phone: [phone]  Fax:  [phone] Name: Victoria ROGALAMRN: [id number] of Birth: 307-26-1967

## 2020-12-30 ENCOUNTER — Telehealth: Payer: Self-pay | Admitting: Family Medicine

## 2020-12-30 NOTE — Telephone Encounter (Signed)
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  At the L4-5 level, there is a synovial cyst on the right side facet joint.  There is moderate narrowing of the central spinal canal and moderate narrowing of both nerve openings at the L4-5 level, but no clear-cut nerve impingement.  There is mild disc bulging at L3-4 with no nerve impingement.  There is mild to moderate bulging at L5-S1 causing moderate narrowing of the nerve openings but no definite nerve impingement.  There is no clear-cut indication for surgery based on the MRI results.  Could contemplate referral for facet joint injections, particularly at the L4-5 level.  This will often calm down the pain from arthritis and can often make the synovial cyst shrink and go away.  MRI report for the left hip is notable for the following: No fracture seen, no significant arthritis.  There is edema/swelling around the joint capsule suggesting a sprain of the joint.  There is a possible fissure of the labrum cartilage which could indicate a tear, but it is not definitely a tear by MRI criteria.  If symptoms persist, we could consider referral for an arthrogram MRI scan or possibly do a one-time cortisone injection.  There is evidence of tendinitis of the gluteus muscle which can cause pain on the side of the hip.  This will generally resolve with physical therapy, but sometimes is treated with a cortisone injection.  The bone marrow on MRI scan shows some changes which could indicate anemia.  Looking at recent lab results, the CBC pattern does suggest anemia.  It would be a good idea to discuss this with your PCP and possibly have some additional tests of the blood.

## 2021-01-05 ENCOUNTER — Other Ambulatory Visit: Payer: Self-pay

## 2021-01-05 ENCOUNTER — Encounter: Payer: Self-pay | Admitting: Physical Therapy

## 2021-01-05 ENCOUNTER — Ambulatory Visit: Payer: Medicare Other | Attending: Family Medicine | Admitting: Physical Therapy

## 2021-01-05 DIAGNOSIS — M542 Cervicalgia: Secondary | ICD-10-CM | POA: Diagnosis present

## 2021-01-05 DIAGNOSIS — R2689 Other abnormalities of gait and mobility: Secondary | ICD-10-CM | POA: Diagnosis present

## 2021-01-05 DIAGNOSIS — M545 Low back pain, unspecified: Secondary | ICD-10-CM

## 2021-01-05 DIAGNOSIS — M25552 Pain in left hip: Secondary | ICD-10-CM

## 2021-01-05 DIAGNOSIS — M25562 Pain in left knee: Secondary | ICD-10-CM

## 2021-01-06 ENCOUNTER — Encounter: Payer: Self-pay | Admitting: Physical Therapy

## 2021-01-06 ENCOUNTER — Other Ambulatory Visit: Payer: Self-pay

## 2021-01-06 NOTE — Patient Instructions (Signed)
Access Code: [email or web address] URL: [email or web address] Date: 01/06/2021 Prepared by: Rosana Hoes  Exercises Supine Pelvic Tilt - 1-2 x daily - 7 x weekly - 10 reps - 3 seconds hold Supine March with Posterior Pelvic Tilt - 1-2 x daily - 7 x weekly - 2 sets - 5 reps Hooklying Isometric Clamshell - 1-2 x daily - 7 x weekly - 2 sets - 5 reps Bent Knee Fallouts - 1-2 x daily - 7 x weekly - 10 reps Supine Lower Trunk Rotation - 1-2 x daily - 7 x weekly - 5 reps - 5 seconds hold Supine Heel Slide - 2-3 x daily - 7 x weekly - 10 reps Supine Quad Set - 2-3 x daily - 7 x weekly - 10 reps - 3 seconds hold Seated Cervical Retraction - 2 x daily - 7 x weekly - 1-2 sets - 10 reps Seated Scapular Retraction - 1-2 x daily - 7 x weekly - 2 sets - 10 reps Seated Gentle Upper Trapezius Stretch - 1-2 x daily - 7 x weekly - 1 sets - 2-3 reps - 10-20 seconds hold Seated Cervical Rotation AROM - 1-2 x daily - 7 x weekly - 1-2 sets - 10 reps

## 2021-01-06 NOTE — Therapy (Signed)
[place], [place], [address] Phone: [phone]   Fax:  [phone]  Physical Therapy Treatment  Patient Details  Name: Victoria Guzman MRN: [id number] Date of Birth: 17-Dec-1965 Referring Provider (PT): Eunice Blase, MD   Encounter Date: 01/05/2021   PT End of Session - 01/06/21 0814    Visit Number 7    Number of Visits 16    Date for PT Re-Evaluation 01/19/21    Authorization Type UHC MCR    Progress Note Due on Visit 10    PT Start Time 1700    PT Stop Time 1745    PT Time Calculation (min) 45 min    Activity Tolerance Patient tolerated treatment well    Behavior During Therapy [facility] for tasks assessed/performed           Past Medical History:  Diagnosis Date  . Anemia   . Anxiety   . Chronic fatigue   . Chronic insomnia 05/31/2016  . Depression   . Family history of adverse reaction to anesthesia    mother difficult to wake up after CABG  . Fibromyalgia   . Hypertension   . Insomnia   . Panic attack   . Stroke ([place])    tia  . Vitamin D deficiency 05/31/2016    Past Surgical History:  Procedure Laterality Date  . ABDOMINAL HYSTERECTOMY    . COLONOSCOPY WITH PROPOFOL N/A 07/24/2018   Procedure: COLONOSCOPY WITH PROPOFOL;  Surgeon: Toledo, Benay Pike, MD;  Location: [facility] ENDOSCOPY;  Service: Gastroenterology;  Laterality: N/A;  . ESOPHAGOGASTRODUODENOSCOPY (EGD) WITH PROPOFOL N/A 07/24/2018   Procedure: ESOPHAGOGASTRODUODENOSCOPY (EGD) WITH PROPOFOL;  Surgeon: Toledo, Benay Pike, MD;  Location: [facility] ENDOSCOPY;  Service: Gastroenterology;  Laterality: N/A;  . LAPAROSCOPIC BILATERAL SALPINGECTOMY Bilateral 01/25/2019   Procedure: LAPAROSCOPIC BILATERAL SALPINGECTOMY;  Surgeon: Schermerhorn, Gwen Her, MD;  Location: [facility] ORS;  Service: Gynecology;  Laterality: Bilateral;  . PARATHYROIDECTOMY    . PARTIAL HYSTERECTOMY    . TRACHELECTOMY N/A 01/25/2019   Procedure: TRACHELECTOMY, laparoscopic  cervical;  Surgeon: Schermerhorn, Gwen Her, MD;  Location: [facility] ORS;  Service: Gynecology;  Laterality: N/A;    There were no vitals filed for this visit.   Subjective Assessment - 01/05/21 1658    Subjective Patient reports pain across lower back. She feels she is slowly improving. Notes having MRI and is scheduled to follow-up with orthopedist next week.    Patient Stated Goals Get life back    Currently in Pain? Yes    Pain Score 6     Pain Location Back    Pain Orientation Right;Left;Lower    Pain Descriptors / Indicators Aching   tension   Pain Type Chronic pain    Pain Onset More than a month ago    Pain Frequency Constant              [facility] PT Assessment - 01/06/21 0001      Observation/Other Assessments   Focus on Therapeutic Outcomes (FOTO)  29% functional status      Ambulation/Gait   Ambulation/Gait Yes    Ambulation/Gait Assistance 7: Independent    Gait Comments No AD, slightly antalgic on left with toe out                         [facility] Adult PT Treatment/Exercise - 01/06/21 0001      Exercises   Exercises Lumbar      Lumbar Exercises: Stretches  Single Knee to Chest Stretch 2 reps;20 seconds    Single Knee to Chest Stretch Limitations limited range on left    Lower Trunk Rotation 5 reps    Lower Trunk Rotation Limitations partial range      Lumbar Exercises: Aerobic   Nustep L5 x 5 min with UE/LE   full range     Lumbar Exercises: Seated   Sit to Stand 5 reps    Sit to Stand Limitations hands on thighs for support, cued for controlled decent      Lumbar Exercises: Supine   Pelvic Tilt 10 reps;5 seconds    Clam 5 reps;3 seconds    Clam Limitations performed bilaterally x 1 set, unilaterally x 2 sets    Bent Knee Raise 10 reps;3 seconds   2 sets   Bent Knee Raise Limitations with PPT    Straight Leg Raise 5 reps    Straight Leg Raises Limitations partial range                  PT Education - 01/06/21 0813    Education  Details HEP update, progression of strengthening to improve walking and standing tolerance    Person(s) Educated Patient    Methods Explanation;Demonstration;Verbal cues;Tactile cues;Handout    Comprehension Verbalized understanding;Returned demonstration;Verbal cues required;Tactile cues required;Need further instruction            PT Short Term Goals - 12/17/20 1939      PT SHORT TERM GOAL #1   Title Patient will be I with initial HEP to progress with PT    Baseline met for initial HEP    Time 4    Period Weeks    Status Achieved      PT SHORT TERM GOAL #2   Title Patient will be able to ambulate household distances using LRAD to improve independence with going to the bathroom    Baseline continues to use rollator    Time 4    Period Weeks    Status On-going      PT SHORT TERM GOAL #3   Title Patient will be able to stand >/= 10 minutes to improve cooking ability    Baseline ongoing pending daily symptoms    Time 4    Period Weeks    Status On-going      PT SHORT TERM GOAL #4   Title Patient will report </= 6/10 pain level with activity to reduce functional limitation    Baseline ongoing    Time 4    Period Weeks    Status On-going             PT Long Term Goals - 12/17/20 1940      PT LONG TERM GOAL #1   Title Patient will be I with final HEP to maintain progress from PT    Baseline will update HEP prn    Time 8    Period Weeks    Status On-going      PT LONG TERM GOAL #2   Title Patient will be able to ambulate community level distance with LRAD in order to perform shopping tasks    Baseline uses rollator    Time 8    Period Weeks    Status On-going      PT LONG TERM GOAL #3   Title Patient will be able to perform all bathing, dressing, and grooming tasks without assistance    Time 8    Period Weeks  Status On-going      PT LONG TERM GOAL #4   Title Patient will report </= 3/10 pain level with activity to reduce functional limitation     Baseline ongoing    Time 8    Period Weeks    Status On-going      PT LONG TERM GOAL #5   Title Patient will report improved functional status >/= 49% on FOTO    Baseline 29%    Time 8    Period Weeks    Status On-going                 Plan - 01/06/21 0981    Clinical Impression Statement Patient tolerated therapy well with no adverse effects. She reported mainly lower back pain this visit so focused therapy on progressing core/hip strengthening exercises. Patient was did report increased pain with exercise but able to tolerate low repetitions with increased breaks. Patient reports motivation to improve and HEP was updated this visit. Patient encouraged to continue walking progressions without AD as tolerated. She would benefit from continued skilled PT to progress mobility and strength to reduce pan and maximize functional ability.    PT Treatment/Interventions ADLs/Self Care Home Management;Cryotherapy;Electrical Stimulation;Iontophoresis 74m/ml Dexamethasone;Moist Heat;Ultrasound;Traction;Neuromuscular re-education;Balance training;Therapeutic exercise;Therapeutic activities;Functional mobility training;Stair training;Gait training;Patient/family education;Manual techniques;Dry needling;Passive range of motion;Taping;Spinal Manipulations;Joint Manipulations    PT Next Visit Plan Continue exercise progession as tolerated, manual therapy for STM lumbar region vs. gentle manual cervical region, modalities prn    PT Home Exercise Plan [email or web address]    Consulted and Agree with Plan of Care Patient           Patient will benefit from skilled therapeutic intervention in order to improve the following deficits and impairments:  Abnormal gait,Decreased range of motion,Difficulty walking,Pain,Decreased activity tolerance,Postural dysfunction,Decreased strength,Decreased mobility  Visit Diagnosis: Acute bilateral low back pain, unspecified whether sciatica present  Pain in left hip  Acute  pain of left knee  Other abnormalities of gait and mobility  Cervicalgia     Problem List Patient Active Problem List   Diagnosis Date Noted  . Post-operative state 01/25/2019  . AR[place] ([place] renal failure) ([place] 04/24/2018  . Acute [place] ([place]) ([place] 04/22/2018  . Hypercalcemia 05/31/2016  . Hypokalemia 05/31/2016  . Hypochloremia 05/31/2016  . Screening for HIV (human immunodeficiency virus) 05/31/2016  . Screen for STD (sexually transmitted disease) 05/31/2016  . Vitamin D deficiency 05/31/2016  . Chronic insomnia 05/31/2016  . Absolute anemia 07/07/2015  . Clinical depression 07/07/2015  . Essential hypertension 07/07/2015  . Acute anxiety 07/07/2015  . Primary fibromyalgia syndrome 07/07/2015  . Embolic stroke ([place] 019/14/7829   CHilda Blades PT, DPT, LAT, ATC 01/06/21  8:19 AM Phone: [phone]: 3[facility] [address]Phone: [phone]  Fax:  [phone] Name: Victoria SNOOKMRN: [id number] of Birth: [birth year]

## 2021-01-07 ENCOUNTER — Ambulatory Visit: Payer: Medicare Other | Admitting: Physical Therapy

## 2021-01-07 ENCOUNTER — Other Ambulatory Visit: Payer: Self-pay

## 2021-01-07 ENCOUNTER — Encounter: Payer: Self-pay | Admitting: Physical Therapy

## 2021-01-07 DIAGNOSIS — M542 Cervicalgia: Secondary | ICD-10-CM

## 2021-01-07 DIAGNOSIS — M545 Low back pain, unspecified: Secondary | ICD-10-CM

## 2021-01-07 DIAGNOSIS — M25552 Pain in left hip: Secondary | ICD-10-CM

## 2021-01-07 DIAGNOSIS — M25562 Pain in left knee: Secondary | ICD-10-CM

## 2021-01-07 DIAGNOSIS — R2689 Other abnormalities of gait and mobility: Secondary | ICD-10-CM

## 2021-01-07 NOTE — Therapy (Signed)
[place], [place], [address] Phone: [phone]   Fax:  [phone]  Physical Therapy Treatment  Patient Details  Name: Victoria Guzman MRN: [id number] Date of Birth: Apr 02, 1966 Referring Provider (PT): Eunice Blase, MD   Encounter Date: 01/07/2021   PT End of Session - 01/07/21 1726    Visit Number 8    Number of Visits 16    Date for PT Re-Evaluation 01/19/21    Authorization Type UHC MCR    Progress Note Due on Visit 10    PT Start Time 7902    PT Stop Time 1815    PT Time Calculation (min) 61 min    Activity Tolerance Patient tolerated treatment well    Behavior During Therapy [facility] for tasks assessed/performed           Past Medical History:  Diagnosis Date  . Anemia   . Anxiety   . Chronic fatigue   . Chronic insomnia 05/31/2016  . Depression   . Family history of adverse reaction to anesthesia    mother difficult to wake up after CABG  . Fibromyalgia   . Hypertension   . Insomnia   . Panic attack   . Stroke ([place])    tia  . Vitamin D deficiency 05/31/2016    Past Surgical History:  Procedure Laterality Date  . ABDOMINAL HYSTERECTOMY    . COLONOSCOPY WITH PROPOFOL N/A 07/24/2018   Procedure: COLONOSCOPY WITH PROPOFOL;  Surgeon: Toledo, Benay Pike, MD;  Location: [facility] END[facility];  Service: Gastroenterology;  Laterality: N/A;  . ESOPHAGOGASTRODUODENOSCOPY (EGD) WITH PROPOFOL N/A 07/24/2018   Procedure: ESOPHAGOGASTRODUODENOSCOPY (EGD) WITH PROPOFOL;  Surgeon: Toledo, Benay Pike, MD;  Location: [facility] END[facility];  Service: Gastroenterology;  Laterality: N/A;  . LAPAROSCOPIC BILATERAL SALPINGECTOMY Bilateral 01/25/2019   Procedure: LAPAROSCOPIC BILATERAL SALPINGECTOMY;  Surgeon: Schermerhorn, Gwen Her, MD;  Location: [facility] ORS;  Service: Gynecology;  Laterality: Bilateral;  . PARATHYROIDECTOMY    . PARTIAL HYSTERECTOMY    . TRACHELECTOMY N/A 01/25/2019   Procedure: TRACHELECTOMY, laparoscopic  cervical;  Surgeon: Schermerhorn, Gwen Her, MD;  Location: [facility] ORS;  Service: Gynecology;  Laterality: N/A;    There were no vitals filed for this visit.   Subjective Assessment - 01/07/21 1718    Subjective Pt. reports continued low back pain (across midline) this PM with continued sleep disturbance and difficulty lying down on left side. Pt. now able to ambulate without rollator and has been trying to work on her mobility with walking and stairs. Per MRI report (as noted by Dr. Junius Roads) pt. had left gluteus medius tendinitis so discussed therapy plan to address-pt. also interested in trial dry needling for low back and hip-see assessment/plan.    Pertinent History Anxiety, HTN, fibromyalgia, history of TIA, depression    Currently in Pain? Yes    Pain Score 7     Pain Location Back    Pain Orientation Left;Right;Lower    Pain Descriptors / Indicators Aching    Pain Onset More than a month ago    Pain Frequency Constant    Aggravating Factors  movement and walking    Pain Relieving Factors exercises                             [facility] Adult PT Treatment/Exercise - 01/07/21 0001      Lumbar Exercises: Stretches   Double Knee to Chest Stretch Limitations DKTC AAROM with legs on 55 cm  P-ball x 10 reps    Other Lumbar Stretch Exercise left manual glut stretch 3x30 sec      Lumbar Exercises: Standing   Other Standing Lumbar Exercises left hip abduction with red Theraband 2x10      Lumbar Exercises: Supine   Pelvic Tilt 15 reps;5 seconds    Clam 20 reps    Clam Limitations red    Bent Knee Raise 10 reps;3 seconds   2 sets   Bent Knee Raise Limitations with PPT            Trigger Point Dry Needling - 01/07/21 0001    Consent Given? Yes    Education Handout Provided Yes    Muscles Treated Back/Hip Gluteus medius;Erector spinae;Lumbar multifidi    Dry Needling Comments needling in prone to left gluteus medius with 30 gauge 75 mm needles and bilat. lumbar longissimus  L4-5 and multifidi L4-S1 with 30-32 gauge 50 mm needles    Electrical Stimulation Performed with Dry Needling Yes    E-stim with Dry Needling Details TENS 2 pps x 10 minutes                PT Education - 01/07/21 1735    Education Details etiology hip symptoms, dry needling    Person(s) Educated Patient    Methods Explanation;Handout    Comprehension Verbalized understanding            PT Short Term Goals - 12/17/20 1939      PT SHORT TERM GOAL #1   Title Patient will be I with initial HEP to progress with PT    Baseline met for initial HEP    Time 4    Period Weeks    Status Achieved      PT SHORT TERM GOAL #2   Title Patient will be able to ambulate household distances using LRAD to improve independence with going to the bathroom    Baseline continues to use rollator    Time 4    Period Weeks    Status On-going      PT SHORT TERM GOAL #3   Title Patient will be able to stand >/= 10 minutes to improve cooking ability    Baseline ongoing pending daily symptoms    Time 4    Period Weeks    Status On-going      PT SHORT TERM GOAL #4   Title Patient will report </= 6/10 pain level with activity to reduce functional limitation    Baseline ongoing    Time 4    Period Weeks    Status On-going             PT Long Term Goals - 12/17/20 1940      PT LONG TERM GOAL #1   Title Patient will be I with final HEP to maintain progress from PT    Baseline will update HEP prn    Time 8    Period Weeks    Status On-going      PT LONG TERM GOAL #2   Title Patient will be able to ambulate community level distance with LRAD in order to perform shopping tasks    Baseline uses rollator    Time 8    Period Weeks    Status On-going      PT LONG TERM GOAL #3   Title Patient will be able to perform all bathing, dressing, and grooming tasks without assistance    Time 8    Period  Weeks    Status On-going      PT LONG TERM GOAL #4   Title Patient will report </= 3/10  pain level with activity to reduce functional limitation    Baseline ongoing    Time 8    Period Weeks    Status On-going      PT LONG TERM GOAL #5   Title Patient will report improved functional status >/= 49% on FOTO    Baseline 29%    Time 8    Period Weeks    Status On-going                 Plan - 01/07/21 1808    Clinical Impression Statement Pt. still with back and hip pain symptoms but functionally improving with ability to ambulate/functional activitiy tolerance from baseline and ability to ambulate without RW. Added hip abductor strengthening and stretches for lateral hip pain and also included trial dry needling today as well for lumbar and hip regions with good tolerance. Expect may take 1-2 days to note results so will await further status and incorporate in plan of care as found beneficial.    Personal Factors and Comorbidities Past/Current Experience;Time since onset of injury/illness/exacerbation;Comorbidity 3+;Fitness    Comorbidities Anxiety, depression, fibromyalgia    Examination-Activity Limitations Locomotion Level;Transfers;Bed Mobility;Bend;Carry;Dressing;Hygiene/Grooming;Lift;Stand;Stairs;Squat;Sleep;Sit    Examination-Participation Restrictions Meal Prep;Cleaning;Community Activity;Driving;Shop;Laundry;Yard Work    [employer] Evolving/Moderate complexity    Clinical Decision Making Moderate    Rehab Potential Good    PT Frequency 2x / week    PT Duration 6 weeks    PT Treatment/Interventions ADLs/Self Care Home Management;Cryotherapy;Electrical Stimulation;Iontophoresis 3m/ml Dexamethasone;Moist Heat;Ultrasound;Traction;Neuromuscular re-education;Balance training;Therapeutic exercise;Therapeutic activities;Functional mobility training;Stair training;Gait training;Patient/family education;Manual techniques;Dry needling;Passive range of motion;Taping;Spinal Manipulations;Joint Manipulations    PT Next Visit Plan Check response dry  needling, continue exercise progression for lumbar and hip region with ROM, stretches and core stabilization    PT Home Exercise Plan [email or web address]    Consulted and Agree with Plan of Care Patient           Patient will benefit from skilled therapeutic intervention in order to improve the following deficits and impairments:  Abnormal gait,Decreased range of motion,Difficulty walking,Pain,Decreased activity tolerance,Postural dysfunction,Decreased strength,Decreased mobility  Visit Diagnosis: Acute bilateral low back pain, unspecified whether sciatica present  Pain in left hip  Acute pain of left knee  Other abnormalities of gait and mobility  Cervicalgia     Problem List Patient Active Problem List   Diagnosis Date Noted  . Post-operative state 01/25/2019  . AR[place] (acute renal failure) ([place] 04/24[place]  . Acute renal failure ([place]) ([place] 04/22/2018  . Hypercalcemia 05/31/2016  . Hypokalemia 05/31/2016  . Hypochloremia 05/31/2016  . Screening for HIV (human immunodeficiency virus) 05/31/2016  . Screen for STD (sexually transmitted disease) 05/31/2016  . Vitamin D deficiency 05/31/2016  . Chronic insomnia 05/31/2016  . Absolute anemia 07/07/2015  . Clinical depression 07/07/2015  . Essential hypertension 07/07/2015  . Acute anxiety 07/07/2015  . Primary fibromyalgia syndrome 07/07/2015  . Embolic stroke ([place] 062/37/6283   CBeaulah Dinning PT, DPT 01/07/21 6:13 PM  [facility] [address]Phone: [phone]  Fax:  [phone] Name: MILAYDA TODAMRN: [id number] of Birth: [birth year]

## 2021-01-07 NOTE — Patient Instructions (Signed)

## 2021-01-11 ENCOUNTER — Ambulatory Visit (INDEPENDENT_AMBULATORY_CARE_PROVIDER_SITE_OTHER): Payer: Medicare Other | Admitting: Family Medicine

## 2021-01-11 ENCOUNTER — Encounter: Payer: Self-pay | Admitting: Family Medicine

## 2021-01-11 ENCOUNTER — Other Ambulatory Visit: Payer: Self-pay

## 2021-01-11 DIAGNOSIS — M25552 Pain in left hip: Secondary | ICD-10-CM

## 2021-01-11 DIAGNOSIS — M25562 Pain in left knee: Secondary | ICD-10-CM | POA: Diagnosis not present

## 2021-01-11 DIAGNOSIS — M545 Low back pain, unspecified: Secondary | ICD-10-CM | POA: Diagnosis not present

## 2021-01-11 DIAGNOSIS — M542 Cervicalgia: Secondary | ICD-10-CM | POA: Diagnosis not present
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
## 2021-01-12 ENCOUNTER — Other Ambulatory Visit: Payer: Self-pay

## 2021-01-12 ENCOUNTER — Encounter: Payer: Self-pay | Admitting: Physical Therapy

## 2021-01-12 ENCOUNTER — Ambulatory Visit: Payer: Medicare Other | Admitting: Physical Therapy

## 2021-01-12 DIAGNOSIS — M25552 Pain in left hip: Secondary | ICD-10-CM

## 2021-01-12 DIAGNOSIS — R2689 Other abnormalities of gait and mobility: Secondary | ICD-10-CM

## 2021-01-12 DIAGNOSIS — M25562 Pain in left knee: Secondary | ICD-10-CM

## 2021-01-12 DIAGNOSIS — M542 Cervicalgia: Secondary | ICD-10-CM

## 2021-01-12 DIAGNOSIS — M545 Low back pain, unspecified: Secondary | ICD-10-CM | POA: Diagnosis not present

## 2021-01-13 ENCOUNTER — Encounter: Payer: Self-pay | Admitting: Physical Therapy

## 2021-01-13 NOTE — Therapy (Signed)
[place], [place], [address] Phone: [phone]   Fax:  [phone]  Physical Therapy Treatment  Patient Details  Name: SKYLYNNE SCHLECHTER MRN: [id number] Date of Birth: [birth year] Referring Provider (PT): Eunice Blase, MD   Encounter Date: 01/12/2021   PT End of Session - 01/12/21 1747    Visit Number 9    Number of Visits 16    Date for PT Re-Evaluation 01/19/21    Authorization Type [employer] MCR    Progress Note Due on Visit 10    PT Start Time 1717   patient arrived late   PT Stop Time 1745    PT Time Calculation (min) 28 min    Activity Tolerance Patient tolerated treatment well    Behavior During Therapy [facility] for tasks assessed/performed           Past Medical History:  Diagnosis Date  . Anemia   . Anxiety   . Chronic fatigue   . Chronic insomnia 05/31/2016  . Depression   . Family history of adverse reaction to anesthesia    mother difficult to wake up after CABG  . Fibromyalgia   . Hypertension   . Insomnia   . Panic attack   . Stroke ([place])    tia  . Vitamin D deficiency 05/31/2016    Past Surgical History:  Procedure Laterality Date  . ABDOMINAL HYSTERECTOMY    . COLONOSCOPY WITH PROPOFOL N/A 07/24/2018   Procedure: COLONOSCOPY WITH PROPOFOL;  Surgeon: Toledo, Benay Pike, MD;  Location: [facility];  Service: Gastroenterology;  Laterality: N/A;  . ESOPHAGOGASTRODUODENOSCOPY (EGD) WITH PROPOFOL N/A 07/24/2018   Procedure: ESOPHAGOGASTRODUODENOSCOPY (EGD) WITH PROPOFOL;  Surgeon: Toledo, Benay Pike, MD;  Location: [facility];  Service: Gastroenterology;  Laterality: N/A;  . LAPAROSCOPIC BILATERAL SALPINGECTOMY Bilateral 01/25/2019   Procedure: LAPAROSCOPIC BILATERAL SALPINGECTOMY;  Surgeon: Schermerhorn, Gwen Her, MD;  Location: [facility];  Service: Gynecology;  Laterality: Bilateral;  . PARATHYROIDECTOMY    . PARTIAL HYSTERECTOMY    . TRACHELECTOMY N/A 01/25/2019   Procedure:  TRACHELECTOMY, laparoscopic cervical;  Surgeon: Schermerhorn, Gwen Her, MD;  Location: [facility];  Service: Gynecology;  Laterality: N/A;    There were no vitals filed for this visit.   Subjective Assessment - 01/12/21 1723    Subjective Patient reports she is feeling tired today. She states her back is better but still hurting, her knee cap still hurts on the tear side, he neck still hurts when she holds it up too long and turning to the right is more difficulty.    Patient Stated Goals Get life back    Currently in Pain? Yes    Pain Score 5     Pain Location Back    Pain Orientation Lower    Pain Descriptors / Indicators Aching    Pain Type Chronic pain    Pain Onset More than a month ago    Pain Frequency Constant              [facility] PT Assessment - 01/13/21 0001      AROM   Cervical - Right Rotation 45   improved to 52 following stretching   Cervical - Left Rotation 53   improved to 55 following stretching                        [facility] Adult PT Treatment/Exercise - 01/13/21 0001      Exercises   Exercises Neck  Neck Exercises: Machines for Strengthening   Nustep L5 x 5 min with UE and LE      Neck Exercises: Seated   Neck Retraction 10 reps;5 secs   cued to avoid over-activation of shoulder/superficial musculature   Other Seated Exercise Shoulder blade squeezes 10 x 5 sec hold   cued to avoid shrug     Neck Exercises: Stretches   Upper Trapezius Stretch 2 reps;20 seconds    Levator Stretch 2 reps;20 seconds                  PT Education - 01/13/21 0809    Education Details Neck stretching, making sure neutral neck position while sleeping on right side, possibly placing pillows behind her to support to reduce stress on right shoulder/neck    Person(s) Educated Patient    Methods Explanation;Handout;Demonstration;Tactile cues;Verbal cues    Comprehension Verbalized understanding;Need further instruction;Returned demonstration;Verbal cues  required;Tactile cues required            PT Short Term Goals - 12/17/20 1939      PT SHORT TERM GOAL #1   Title Patient will be I with initial HEP to progress with PT    Baseline met for initial HEP    Time 4    Period Weeks    Status Achieved      PT SHORT TERM GOAL #2   Title Patient will be able to ambulate household distances using LRAD to improve independence with going to the bathroom    Baseline continues to use rollator    Time 4    Period Weeks    Status On-going      PT SHORT TERM GOAL #3   Title Patient will be able to stand >/= 10 minutes to improve cooking ability    Baseline ongoing pending daily symptoms    Time 4    Period Weeks    Status On-going      PT SHORT TERM GOAL #4   Title Patient will report </= 6/10 pain level with activity to reduce functional limitation    Baseline ongoing    Time 4    Period Weeks    Status On-going             PT Long Term Goals - 12/17/20 1940      PT LONG TERM GOAL #1   Title Patient will be I with final HEP to maintain progress from PT    Baseline will update HEP prn    Time 8    Period Weeks    Status On-going      PT LONG TERM GOAL #2   Title Patient will be able to ambulate community level distance with LRAD in order to perform shopping tasks    Baseline uses rollator    Time 8    Period Weeks    Status On-going      PT LONG TERM GOAL #3   Title Patient will be able to perform all bathing, dressing, and grooming tasks without assistance    Time 8    Period Weeks    Status On-going      PT LONG TERM GOAL #4   Title Patient will report </= 3/10 pain level with activity to reduce functional limitation    Baseline ongoing    Time 8    Period Weeks    Status On-going      PT LONG TERM GOAL #5   Title Patient will report improved functional status >/=  49% on FOTO    Baseline 29%    Time 8    Period Weeks    Status On-going                 Plan - 01/13/21 3612    Clinical Impression  Statement Patient tolerated therapy well with no adverse effects. She reports continued overall improvement in symptoms, but noted increased difficulty turning head to the right to look over shoulder. Therapy focused primarily on improving neck motion and patient did exhibit improved cervical rotation following stretching and exercise. She reports she is only able to sleep on right side so this is likely contributing to symptoms. Patient ecouraged to work on neck stretches and ensure neutral cervical spine while lying on right side if able. Patient would benefit from continued skilled PT to progress mobility and strength to reduce pain and maximize functional ability.    PT Treatment/Interventions ADLs/Self Care Home Management;Cryotherapy;Electrical Stimulation;Iontophoresis 89m/ml Dexamethasone;Moist Heat;Ultrasound;Traction;Neuromuscular re-education;Balance training;Therapeutic exercise;Therapeutic activities;Functional mobility training;Stair training;Gait training;Patient/family education;Manual techniques;Dry needling;Passive range of motion;Taping;Spinal Manipulations;Joint Manipulations    PT Next Visit Plan Continue dry needling, continue exercise progression for lumbar and hip region with ROM, stretches and core stabilization    PT Home Exercise Plan [email or web address]    Consulted and Agree with Plan of Care Patient           Patient will benefit from skilled therapeutic intervention in order to improve the following deficits and impairments:  Abnormal gait,Decreased range of motion,Difficulty walking,Pain,Decreased activity tolerance,Postural dysfunction,Decreased strength,Decreased mobility  Visit Diagnosis: Cervicalgia  Acute bilateral low back pain, unspecified whether sciatica present  Pain in left hip  Acute pain of left knee  Other abnormalities of gait and mobility     Problem List Patient Active Problem List   Diagnosis Date Noted  . Post-operative state 01/25/2019  . AR[place]  (acute renal failure) ([facility] 04/24/2018  . Acute [place] failure (ARF) ([facility] 04/22/2018  . Hypercalcemia 05/31/2016  . Hypokalemia 05/31/2016  . Hypochloremia 05/31/2016  . Screening for HIV (human immunodeficiency virus) 05/31/2016  . Screen for STD (sexually transmitted disease) 05/31/2016  . Vitamin D deficiency 05/31/2016  . Chronic insomnia 05/31/2016  . Absolute anemia 07/07/2015  . Clinical depression 07/07/2015  . Essential hypertension 07/07/2015  . Acute anxiety 07/07/2015  . Primary fibromyalgia syndrome 07/07/2015  . Embolic stroke ([facility] 024/49/7530   CHilda Blades PT, DPT, LAT, ATC 01/13/21  8:19 AM Phone: [phone]: 3[facility] [address]Phone: [phone]  Fax:  [phone] Name: MJISELL MAJERMRN: [id number] of Birth: [birth year]

## 2021-01-14 ENCOUNTER — Ambulatory Visit: Payer: Medicare Other | Admitting: Physical Therapy

## 2021-01-19 ENCOUNTER — Encounter: Payer: Medicare Other | Admitting: Rehabilitative and Restorative Service Providers"

## 2021-01-21 ENCOUNTER — Ambulatory Visit: Payer: Medicare Other | Admitting: Physical Therapy

## 2021-01-27 ENCOUNTER — Encounter: Payer: Self-pay | Admitting: Physical Therapy

## 2021-01-27 ENCOUNTER — Ambulatory Visit: Payer: Medicare Other | Admitting: Physical Therapy

## 2021-01-27 ENCOUNTER — Other Ambulatory Visit: Payer: Self-pay

## 2021-01-27 DIAGNOSIS — M25552 Pain in left hip: Secondary | ICD-10-CM

## 2021-01-27 DIAGNOSIS — M25562 Pain in left knee: Secondary | ICD-10-CM

## 2021-01-27 DIAGNOSIS — M545 Low back pain, unspecified: Secondary | ICD-10-CM

## 2021-01-27 DIAGNOSIS — R2689 Other abnormalities of gait and mobility: Secondary | ICD-10-CM

## 2021-01-27 DIAGNOSIS — M542 Cervicalgia: Secondary | ICD-10-CM

## 2021-01-28 ENCOUNTER — Encounter: Payer: Self-pay | Admitting: Physical Therapy

## 2021-01-28 NOTE — Patient Instructions (Signed)
Access Code: [email or web address] URL: [email or web address] Date: 01/28/2021 Prepared by: Rosana Hoes  Exercises Hooklying Single Knee to Chest Stretch - 1-2 x daily - 7 x weekly - 2 reps - 20-30 hold Supine Piriformis Stretch with Foot on Ground - 1-2 x daily - 7 x weekly - 2 reps - 20-30 hold Supine Lower Trunk Rotation - 1-2 x daily - 7 x weekly - 10 reps - 5 seconds hold Seated Hamstring Stretch - 1-2 x daily - 7 x weekly - 2 reps - 20-30 seconds hold Supine Posterior Pelvic Tilt - 1-2 x daily - 7 x weekly - 10 reps - 5 seconds hold Hooklying Clamshell with Resistance - 1-2 x daily - 7 x weekly - 10 reps - 3 seconds hold Small Range Straight Leg Raise - 1-2 x daily - 7 x weekly - 10 reps Hip Abduction with Resistance Loop - 1 x daily - 7 x weekly - 2-3 sets - 10 reps Seated Cervical Retraction - 2 x daily - 7 x weekly - 1-2 sets - 10 reps - 5 seconds hold Seated Scapular Retraction - 1-2 x daily - 7 x weekly - 2 sets - 10 reps Seated Cervical Rotation AROM - 1-2 x daily - 7 x weekly - 1-2 sets - 10 reps Gentle Upper Trap Stretch - 2 x daily - 7 x weekly - 2 reps - 20 seconds hold Gentle Levator Scapulae Stretch - 2 x daily - 7 x weekly - 2 reps - 20 seconds hold

## 2021-01-28 NOTE — Therapy (Signed)
[place], [place], [address] Phone: [phone]   Fax:  [phone]  Physical Therapy Treatment / ERO  Progress Note Reporting Period 11/24/2020 to 01/28/2021  See note below for Objective Data and Assessment of Progress/Goals.    Patient Details  Name: Victoria Guzman MRN: [id number] Date of Birth: Oct 06, 1966 Referring Provider (PT): Eunice Blase, MD   Encounter Date: 01/27/2021   PT End of Session - 01/27/21 1715    Visit Number 10    Number of Visits 16    Date for PT Re-Evaluation 03/10/21    Authorization Type UHC MCR    Progress Note Due on Visit 10    PT Start Time 1712    PT Stop Time 1750    PT Time Calculation (min) 38 min    Activity Tolerance Patient tolerated treatment well    Behavior During Therapy [facility] for tasks assessed/performed           Past Medical History:  Diagnosis Date  . Anemia   . Anxiety   . Chronic fatigue   . Chronic insomnia 05/31/2016  . Depression   . Family history of adverse reaction to anesthesia    mother difficult to wake up after CABG  . Fibromyalgia   . Hypertension   . Insomnia   . Panic attack   . Stroke ([place])    tia  . Vitamin D deficiency 05/31/2016    Past Surgical History:  Procedure Laterality Date  . ABDOMINAL HYSTERECTOMY    . COLONOSCOPY WITH PROPOFOL N/A 07/24/2018   Procedure: COLONOSCOPY WITH PROPOFOL;  Surgeon: Toledo, Benay Pike, MD;  Location: [facility] ENDOSCOPY;  Service: Gastroenterology;  Laterality: N/A;  . ESOPHAGOGASTRODUODENOSCOPY (EGD) WITH PROPOFOL N/A 07/24/2018   Procedure: ESOPHAGOGASTRODUODENOSCOPY (EGD) WITH PROPOFOL;  Surgeon: Toledo, Benay Pike, MD;  Location: [facility];  Service: Gastroenterology;  Laterality: N/A;  . LAPAROSCOPIC BILATERAL SALPINGECTOMY Bilateral 01/25/2019   Procedure: LAPAROSCOPIC BILATERAL SALPINGECTOMY;  Surgeon: Schermerhorn, Gwen Her, MD;  Location: [facility] ORS;  Service: Gynecology;   Laterality: Bilateral;  . PARATHYROIDECTOMY    . PARTIAL HYSTERECTOMY    . TRACHELECTOMY N/A 01/25/2019   Procedure: TRACHELECTOMY, laparoscopic cervical;  Surgeon: Schermerhorn, Gwen Her, MD;  Location: [facility] ORS;  Service: Gynecology;  Laterality: N/A;    There were no vitals filed for this visit.   Subjective Assessment - 01/27/21 1711    Subjective Patient reports she is doing alright. Her back is still having pain, and her left knee. She is doing her exercises and they are going well.    Limitations Standing;Walking;House hold activities    How long can you sit comfortably? No real limitation, may need to shift weight occasionally    How long can you stand comfortably? 15 minutes    How long can you walk comfortably? 15-20 minutes    Patient Stated Goals Get life back    Currently in Pain? Yes    Pain Score 7     Pain Location Back    Pain Orientation Lower    Pain Descriptors / Indicators --   "just hurts, pain"   Pain Type Chronic pain    Pain Onset More than a month ago    Pain Frequency Constant              [facility] PT Assessment - 01/28/21 0001      Assessment   Medical Diagnosis Acute left-sided low back pain, Pain in left hip, Acute pain of left knee,  Neck pain    Referring Provider (PT) Hilts, Isaura Schiller, MD    Onset Date/Surgical Date 10/18/20    Next MD Visit 02/15/2021      Precautions   Precautions None      Restrictions   Weight Bearing Restrictions No      Balance Screen   Has the patient fallen in the past 6 months No      Prior Function   Level of Independence Independent      Cognition   Overall Cognitive Status Within Functional Limits for tasks assessed      Observation/Other Assessments   Observations Patient appears in no apparent distress    Focus on Therapeutic Outcomes (FOTO)  Not assessed due to time limitation      Sensation   Light Touch Appears Intact      Coordination   Gross Motor Movements are Fluid and Coordinated Yes       Sit to Stand   Comments Patient able to perform with minimal use of BUE on thighs, increased time required and cueing for proper weight shift (nose over toes)      Posture/Postural Control   Posture Comments Forward head and rounded shoulder  posturing      Transfers   Transfers Independent with all Transfers      Ambulation/Gait   Ambulation/Gait Yes    Ambulation/Gait Assistance 7: Independent    Gait Comments Patient continues to exhibit slight antalgic gait on left, gait speed and visible comfort wtih walking improved since last visit                         [place] Adult PT Treatment/Exercise - 01/28/21 0001      Exercises   Exercises Lumbar      Lumbar Exercises: Stretches   Passive Hamstring Stretch 2 reps;30 seconds    Passive Hamstring Stretch Limitations seated edge of mat   cued for posture   Single Knee to Chest Stretch 2 reps;30 seconds    Single Knee to Chest Stretch Limitations hooklying    Lower Trunk Rotation Limitations 10 x 5 sec    Piriformis Stretch 2 reps;30 seconds    Piriformis Stretch Limitations supine      Lumbar Exercises: Aerobic   Nustep L5 x 5 min with UE/LE      Lumbar Exercises: Supine   Pelvic Tilt 10 reps;5 seconds    Pelvic Tilt Limitations PPT, cueing for proper core engagement, hands placed under lumbar spine    Clam 10 reps;3 seconds   2 sets   Clam Limitations red    Straight Leg Raise 10 reps    Straight Leg Raises Limitations cued for abdominal and quad engagement                  PT Education - 01/27/21 1714    Education Details POC update, HEP update, continued progression of walking    Person(s) Educated Patient    Methods Explanation;Demonstration;Tactile cues;Verbal cues;Handout    Comprehension Verbalized understanding;Returned demonstration;Verbal cues required;Tactile cues required;Need further instruction            PT Short Term Goals - 01/28/21 0819      PT SHORT TERM GOAL #1   Title  Patient will be I with initial HEP to progress with PT    Baseline met for initial HEP    Time 4    Period Weeks    Status Achieved  PT SHORT TERM GOAL #2   Title Patient will be able to ambulate household distances using LRAD to improve independence with going to the bathroom    Baseline Patient able to ambulate throughout household without limitation or AD    Time 4    Period Weeks    Status Achieved      PT SHORT TERM GOAL #3   Title Patient will be able to stand >/= 10 minutes to improve cooking ability    Baseline Patient reports ability to stand 10-15 minutes    Time 4    Period Weeks    Status Achieved      PT SHORT TERM GOAL #4   Title Patient will report </= 6/10 pain level with activity to reduce functional limitation    Baseline Patient continues to report high levels of pain    Time 3    Period Weeks    Status On-going    Target Date 02/18/21             PT Long Term Goals - 01/27/21 1724      PT LONG TERM GOAL #1   Title Patient will be I with final HEP to maintain progress from PT    Baseline Continuing to update HEP    Time 6    Period Weeks    Status On-going    Target Date 03/10/21      PT LONG TERM GOAL #2   Title Patient will be able to ambulate community level distance with LRAD in order to perform shopping tasks    Baseline Patient ambulating in community without AD but continues to be limited and requires rest breaks due to pain    Time 6    Period Weeks    Status On-going    Target Date 03/10/21      PT LONG TERM GOAL #3   Title Patient will be able to perform all bathing, dressing, and grooming tasks without assistance    Time 8    Period Weeks    Status Achieved      PT LONG TERM GOAL #4   Title Patient will report </= 3/10 pain level with activity to reduce functional limitation    Baseline Patient continues to high levels of pain with activity    Time 6    Period Weeks    Status On-going    Target Date 03/10/21      PT  LONG TERM GOAL #5   Title Patient will report improved functional status >/= 49% on FOTO    Baseline Not asssessd due to time limitations    Time 6    Period Weeks    Status On-going    Target Date 03/10/21      Additional Long Term Goals   Additional Long Term Goals Yes      PT LONG TERM GOAL #6   Title Patient will be able to stand >/= 30 minutes without seated rest break to improve cooking ability    Time 6    Period Weeks    Status New    Target Date 03/10/21      PT LONG TERM GOAL #7   Title Patient will be able to lift >/= 10 lbs with proper technique to improve performing of household tasks    Time 6    Period Weeks    Status New    Target Date 03/10/21  Plan - 01/27/21 1723    Clinical Impression Statement Patient tolerated therapy well with no adverse effects. Therapy limited due to patient arriving without appointment which delayed start of session. She was reporting greater low back pain this visit so focused treatment on lumbar spine. She has demonstrated improvement in ambulatory ability, currently not using AD and able to ambulate at community level with minimal limitation. Her gross AROM has improved and she is tolerating progressions in mobility and strengthening well. However, patient continues to report high levels of pain and does have limitations with gait deviations, ability to maintain standing or walking for extended periods without rest break due to pain, and limited with any moderate or higher levels of household tasks. Patient would benefit from continued skilled PT to progress mobility and strength to reduce pain and maximize functional ability.    PT Frequency 1x / week    PT Duration 6 weeks    PT Treatment/Interventions ADLs/Self Care Home Management;Cryotherapy;Electrical Stimulation;Iontophoresis 58m/ml Dexamethasone;Moist Heat;Ultrasound;Traction;Neuromuscular re-education;Balance training;Therapeutic exercise;Therapeutic  activities;Functional mobility training;Stair training;Gait training;Patient/family education;Manual techniques;Dry needling;Passive range of motion;Taping;Spinal Manipulations;Joint Manipulations    PT Next Visit Plan Continue dry needling, continue exercise progression for lumbar and hip region with ROM, stretches and core stabilization    PT Home Exercise Plan [email or web address]    Consulted and Agree with Plan of Care Patient           Patient will benefit from skilled therapeutic intervention in order to improve the following deficits and impairments:  Abnormal gait,Decreased range of motion,Difficulty walking,Pain,Decreased activity tolerance,Postural dysfunction,Decreased strength,Decreased mobility  Visit Diagnosis: Acute bilateral low back pain, unspecified whether sciatica present  Cervicalgia  Pain in left hip  Acute pain of left knee  Other abnormalities of gait and mobility     Problem List Patient Active Problem List   Diagnosis Date Noted  . Post-operative state 01/25/2019  . [place] 04/[place]  . Acute renal failure (ARF) ([place] 04/22/2018  . Hypercalcemia 05/31/2016  . Hypokalemia 05/31/2016  . Hypochloremia 05/31/2016  . Screening for HIV (human immunodeficiency virus) 05/31/2016  . Screen for STD (sexually transmitted disease) 05/31/2016  . Vitamin D deficiency 05/31/2016  . Chronic insomnia 05/31/2016  . Absolute anemia 07/07/2015  . Clinical depression 07/07/2015  . Essential hypertension 07/07/2015  . Acute anxiety 07/07/2015  . Primary fibromyalgia syndrome 07/07/2015  . Embolic stroke ([place] 028/31/[id number]   CHilda Blades PT, DPT, LAT, ATC 01/28/21  8:50 AM Phone: [phone]: 3[facility] [address]Phone: [phone]  Fax:  [phone] Name: MJASHAWNA REEVERMRN: [id number] of Birth: 301/01/1966

## 2021-02-15 ENCOUNTER — Ambulatory Visit: Payer: Medicare Other | Admitting: Family Medicine

## 2021-02-18 ENCOUNTER — Ambulatory Visit: Payer: Medicare Other | Admitting: Physical Therapy

## 2021-02-22 ENCOUNTER — Other Ambulatory Visit: Payer: Self-pay

## 2021-02-22 ENCOUNTER — Ambulatory Visit (INDEPENDENT_AMBULATORY_CARE_PROVIDER_SITE_OTHER): Payer: Medicare Other | Admitting: Family Medicine

## 2021-02-22 ENCOUNTER — Encounter: Payer: Self-pay | Admitting: Physical Therapy

## 2021-02-22 ENCOUNTER — Ambulatory Visit: Payer: Medicare Other | Attending: Family Medicine | Admitting: Physical Therapy

## 2021-02-22 DIAGNOSIS — M25562 Pain in left knee: Secondary | ICD-10-CM

## 2021-02-22 DIAGNOSIS — M545 Low back pain, unspecified: Secondary | ICD-10-CM

## 2021-02-22 DIAGNOSIS — M25552 Pain in left hip: Secondary | ICD-10-CM | POA: Insufficient documentation

## 2021-02-22 DIAGNOSIS — M542 Cervicalgia: Secondary | ICD-10-CM | POA: Diagnosis not present

## 2021-02-22 DIAGNOSIS — R2689 Other abnormalities of gait and mobility: Secondary | ICD-10-CM | POA: Insufficient documentation

## 2021-02-22 NOTE — Therapy (Signed)
[place], [place], [address] Phone: [phone]   Fax:  [phone]  Physical Therapy Treatment  Patient Details  Name: Victoria Guzman MRN: [id number] Date of Birth: 1966/06/13 Referring Provider (PT): Eunice Blase, MD   Encounter Date: 02/22/2021   PT End of Session - 02/22/21 1635    Visit Number 11    Number of Visits 16    Date for PT Re-Evaluation 03/10/21    Authorization Type UHC MCR    PT Start Time 1632    PT Stop Time 1718    PT Time Calculation (min) 46 min    Activity Tolerance Patient tolerated treatment well    Behavior During Therapy [facility] for tasks assessed/performed           Past Medical History:  Diagnosis Date  . Anemia   . Anxiety   . Chronic fatigue   . Chronic insomnia 05/31/2016  . Depression   . Family history of adverse reaction to anesthesia    mother difficult to wake up after CABG  . Fibromyalgia   . Hypertension   . Insomnia   . Panic attack   . Stroke ([place])    tia  . Vitamin D deficiency 05/31/2016    Past Surgical History:  Procedure Laterality Date  . ABDOMINAL HYSTERECTOMY    . COLONOSCOPY WITH PROPOFOL N/A 07/24/2018   Procedure: COLONOSCOPY WITH PROPOFOL;  Surgeon: Toledo, Benay Pike, MD;  Location: [facility];  Service: Gastroenterology;  Laterality: N/A;  . ESOPHAGOGASTRODUODENOSCOPY (EGD) WITH PROPOFOL N/A 07/24/2018   Procedure: ESOPHAGOGASTRODUODENOSCOPY (EGD) WITH PROPOFOL;  Surgeon: Toledo, Benay Pike, MD;  Location: [facility];  Service: Gastroenterology;  Laterality: N/A;  . LAPAROSCOPIC BILATERAL SALPINGECTOMY Bilateral 01/25/2019   Procedure: LAPAROSCOPIC BILATERAL SALPINGECTOMY;  Surgeon: Schermerhorn, Gwen Her, MD;  Location: [facility] ORS;  Service: Gynecology;  Laterality: Bilateral;  . PARATHYROIDECTOMY    . PARTIAL HYSTERECTOMY    . TRACHELECTOMY N/A 01/25/2019   Procedure: TRACHELECTOMY, laparoscopic cervical;  Surgeon: Schermerhorn,  Gwen Her, MD;  Location: [facility] ORS;  Service: Gynecology;  Laterality: N/A;    There were no vitals filed for this visit.   Subjective Assessment - 02/22/21 1637    Subjective "I've not had any physical therapy for over 3 weeks ago. My back, my hip and my L knee has been getting more swelling and I feel like I get some heat in it. I've been doing the exercise but feel like that area on the wide.                             [place] Adult PT Treatment/Exercise - 02/22/21 0001      Lumbar Exercises: Aerobic   Nustep L5 x 3.5 min UE/LE      Lumbar Exercises: Supine   Straight Leg Raise 10 reps   x 2 sets     Knee/Hip Exercises: Stretches   Passive Hamstring Stretch 1 rep;Left;30 seconds   sitting     Manual Therapy   Manual Therapy Joint mobilization    Manual therapy comments skilled palpation and monitoring of pt throughout TPDN    Joint Mobilization LAD RLE grade III with gentle oscillations            Trigger Point Dry Needling - 02/22/21 0001    Consent Given? Yes    Education Handout Provided Previously provided    Muscles Treated Back/Hip Lumbar multifidi    Electrical  Stimulation Performed with Dry Needling Yes    E-stim with Dry Needling Details freq 20, intesity tolerance x 8 min increasing as tolerated    Lumbar multifidi Response Twitch response elicited;Palpable increased muscle length   L side only                 PT Short Term Goals - 01/28/21 0819      PT SHORT TERM GOAL #1   Title Patient will be I with initial HEP to progress with PT    Baseline met for initial HEP    Time 4    Period Weeks    Status Achieved      PT SHORT TERM GOAL #2   Title Patient will be able to ambulate household distances using LRAD to improve independence with going to the bathroom    Baseline Patient able to ambulate throughout household without limitation or AD    Time 4    Period Weeks    Status Achieved      PT SHORT TERM GOAL #3   Title Patient  will be able to stand >/= 10 minutes to improve cooking ability    Baseline Patient reports ability to stand 10-15 minutes    Time 4    Period Weeks    Status Achieved      PT SHORT TERM GOAL #4   Title Patient will report </= 6/10 pain level with activity to reduce functional limitation    Baseline Patient continues to report high levels of pain    Time 3    Period Weeks    Status On-going    Target Date 02/18/21             PT Long Term Goals - 01/27/21 1724      PT LONG TERM GOAL #1   Title Patient will be I with final HEP to maintain progress from PT    Baseline Continuing to update HEP    Time 6    Period Weeks    Status On-going    Target Date 03/10/21      PT LONG TERM GOAL #2   Title Patient will be able to ambulate community level distance with LRAD in order to perform shopping tasks    Baseline Patient ambulating in community without AD but continues to be limited and requires rest breaks due to pain    Time 6    Period Weeks    Status On-going    Target Date 03/10/21      PT LONG TERM GOAL #3   Title Patient will be able to perform all bathing, dressing, and grooming tasks without assistance    Time 8    Period Weeks    Status Achieved      PT LONG TERM GOAL #4   Title Patient will report </= 3/10 pain level with activity to reduce functional limitation    Baseline Patient continues to high levels of pain with activity    Time 6    Period Weeks    Status On-going    Target Date 03/10/21      PT LONG TERM GOAL #5   Title Patient will report improved functional status >/= 49% on FOTO    Baseline Not asssessd due to time limitations    Time 6    Period Weeks    Status On-going    Target Date 03/10/21      Additional Long Term Goals   Additional Long Term Goals  Yes      PT LONG TERM GOAL #6   Title Patient will be able to stand >/= 30 minutes without seated rest break to improve cooking ability    Time 6    Period Weeks    Status New    Target  Date 03/10/21      PT LONG TERM GOAL #7   Title Patient will be able to lift >/= 10 lbs with proper technique to improve performing of household tasks    Time 6    Period Weeks    Status New    Target Date 03/10/21                 Plan - 02/22/21 1705    Clinical Impression Statement pt noted frustration with scheduling noting she hasn't been able to get in but upon further review has a hx of frequent cancellations for various personal reasons. continued TPDN today focusing on the L lumbar parapsinals combined with E-stm which pt kept dosing off throughout treatment. addresses potential SIJ involvement with hamstring stretch and SLR which she responded favorably to noting decreased tension/ pain.  updated HEP to include SLR.    PT Treatment/Interventions ADLs/Self Care Home Management;Cryotherapy;Electrical Stimulation;Iontophoresis 68m/ml Dexamethasone;Moist Heat;Ultrasound;Traction;Neuromuscular re-education;Balance training;Therapeutic exercise;Therapeutic activities;Functional mobility training;Stair training;Gait training;Patient/family education;Manual techniques;Dry needling;Passive range of motion;Taping;Spinal Manipulations;Joint Manipulations    PT Next Visit Plan Continue dry needling, continue exercise progression for lumbar and hip region with ROM, stretches and core stabilization, resonse to DN  and SLR    PT Home Exercise Plan [email or web address]           Patient will benefit from skilled therapeutic intervention in order to improve the following deficits and impairments:  Abnormal gait,Decreased range of motion,Difficulty walking,Pain,Decreased activity tolerance,Postural dysfunction,Decreased strength,Decreased mobility  Visit Diagnosis: Acute bilateral low back pain, unspecified whether sciatica present  Cervicalgia  Pain in left hip  Acute pain of left knee  Other abnormalities of gait and mobility     Problem List Patient Active Problem List   Diagnosis Date  Noted  . Post-operative state 01/25/2019  . AR[place] failure) ([place] 04/24/[place]  . Acute [place] failure (ARF) ([place] 04/22/2018  . Hypercalcemia 05/31/2016  . Hypokalemia 05/31/2016  . Hypochloremia 05/31/2016  . Screening for HIV (human immunodeficiency virus) 05/31/2016  . Screen for STD (sexually transmitted disease) 05/31/2016  . Vitamin D deficiency 05/31/2016  . Chronic insomnia 05/31/2016  . Absolute anemia 07/07/2015  . Clinical depression 07/07/2015  . Essential hypertension 07/07/2015  . Acute anxiety 07/07/2015  . Primary fibromyalgia syndrome 07/07/2015  . Embolic stroke ([place] 032/20/2542  KStarr LakePT, DPT, LAT, ATC  02/22/21  5:26 PM      [facility]athonCDoug[facility] Center[address] [address]Phone: [phone]  Fax:  [phone] Name: MALLENE FURUYAMRN: [id number] of Birth: [birth year]

## 2021-02-22 NOTE — Addendum Note (Signed)
Addended by: Rip Harbour on: 02/22/2021 03:41 PM   Modules accepted: Orders
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
## 2021-03-02 ENCOUNTER — Encounter: Payer: Self-pay | Admitting: Physical Therapy

## 2021-03-02 ENCOUNTER — Ambulatory Visit: Payer: Medicare Other | Admitting: Physical Therapy

## 2021-03-02 ENCOUNTER — Other Ambulatory Visit: Payer: Self-pay

## 2021-03-02 DIAGNOSIS — R2689 Other abnormalities of gait and mobility: Secondary | ICD-10-CM

## 2021-03-02 DIAGNOSIS — M545 Low back pain, unspecified: Secondary | ICD-10-CM | POA: Diagnosis not present

## 2021-03-02 DIAGNOSIS — M25562 Pain in left knee: Secondary | ICD-10-CM

## 2021-03-02 DIAGNOSIS — M542 Cervicalgia: Secondary | ICD-10-CM

## 2021-03-02 DIAGNOSIS — M25552 Pain in left hip: Secondary | ICD-10-CM

## 2021-03-02 NOTE — Patient Instructions (Signed)
Access Code: [email or web address] URL: [email or web address] Date: 03/02/2021 Prepared by: Rosana Hoes  Exercises Hooklying Single Knee to Chest Stretch - 1-2 x daily - 7 x weekly - 2 reps - 20-30 hold Supine Piriformis Stretch with Foot on Ground - 1-2 x daily - 7 x weekly - 2 reps - 20-30 hold Supine Lower Trunk Rotation - 1-2 x daily - 7 x weekly - 10 reps - 5 seconds hold Seated Hamstring Stretch - 1-2 x daily - 7 x weekly - 2 reps - 20-30 seconds hold Bridge - 1 x daily - 7 x weekly - 2 sets - 10 reps Hooklying Clamshell with Resistance - 1 x daily - 7 x weekly - 10 reps - 3 seconds hold - 2 sets Supine Active Straight Leg Raise - 1 x daily - 7 x weekly - 2 sets - 10 reps Sit to Stand - 1 x daily - 7 x weekly - 2 sets - 10 reps Hip Abduction with Resistance Loop - 1 x daily - 7 x weekly - 2 sets - 10 reps Standing Row with Anchored Resistance - 1 x daily - 7 x weekly - 2 sets - 10 reps Seated Cervical Retraction - 2 x daily - 7 x weekly - 1-2 sets - 10 reps - 5 seconds hold Seated Cervical Rotation AROM - 1-2 x daily - 7 x weekly - 1-2 sets - 10 reps Gentle Upper Trap Stretch - 2 x daily - 7 x weekly - 2 reps - 20 seconds hold Gentle Levator Scapulae Stretch - 2 x daily - 7 x weekly - 2 reps - 20 seconds hold

## 2021-03-02 NOTE — Therapy (Signed)
[place], [place], [address] Phone: [phone]   Fax:  [phone]  Physical Therapy Treatment  Patient Details  Name: Victoria Guzman MRN: [id number] Date of Birth: 1966/09/03 Referring Provider (PT): Eunice Blase, MD   Encounter Date: 03/02/2021   PT End of Session - 03/02/21 1659    Visit Number 12    Number of Visits 16    Date for PT Re-Evaluation 03/10/21    Authorization Type U[employer] MCR    Progress Note Due on Visit 20    PT Start Time 1705    PT Stop Time 1745    PT Time Calculation (min) 40 min    Activity Tolerance Patient tolerated treatment well    Behavior During Therapy [facility] for tasks assessed/performed           Past Medical History:  Diagnosis Date  . Anemia   . Anxiety   . Chronic fatigue   . Chronic insomnia 05/31/2016  . Depression   . Family history of adverse reaction to anesthesia    mother difficult to wake up after CABG  . Fibromyalgia   . Hypertension   . Insomnia   . Panic attack   . Stroke ([place])    tia  . Vitamin D deficiency 05/31/2016    Past Surgical History:  Procedure Laterality Date  . ABDOMINAL HYSTERECTOMY    . COLONOSCOPY WITH PROPOFOL N/A 07/24/2018   Procedure: COLONOSCOPY WITH PROPOFOL;  Surgeon: Toledo, Benay Pike, MD;  Location: [facility];  Service: Gastroenterology;  Laterality: N/A;  . ESOPHAGOGASTRODUODENOSCOPY (EGD) WITH PROPOFOL N/A 07/24/2018   Procedure: ESOPHAGOGASTRODUODENOSCOPY (EGD) WITH PROPOFOL;  Surgeon: Toledo, Benay Pike, MD;  Location: [facility];  Service: Gastroenterology;  Laterality: N/A;  . LAPAROSCOPIC BILATERAL SALPINGECTOMY Bilateral 01/25/2019   Procedure: LAPAROSCOPIC BILATERAL SALPINGECTOMY;  Surgeon: Schermerhorn, Gwen Her, MD;  Location: [facility] ORS;  Service: Gynecology;  Laterality: Bilateral;  . PARATHYROIDECTOMY    . PARTIAL HYSTERECTOMY    . TRACHELECTOMY N/A 01/25/2019   Procedure: TRACHELECTOMY, laparoscopic  cervical;  Surgeon: Schermerhorn, Gwen Her, MD;  Location: [facility] ORS;  Service: Gynecology;  Laterality: N/A;    There were no vitals filed for this visit.   Subjective Assessment - 03/02/21 1706    Subjective Patient reports pain mainly located to back and knee. States it is getting better but still hurts.    How long can you walk comfortably? 15-20 minutes (grocery store)    Patient Stated Goals Get life back    Currently in Pain? Yes    Pain Score 6     Pain Location Back    Pain Orientation Lower    Pain Descriptors / Indicators Aching    Pain Type Chronic pain    Pain Onset More than a month ago    Pain Score 5    Pain Location Knee    Pain Orientation Left    Pain Descriptors / Indicators Aching;Throbbing    Pain Type Chronic pain    Pain Onset More than a month ago    Pain Frequency Constant              [facility] PT Assessment - 03/02/21 0001      Assessment   Next MD Visit Patient reports she is going to spinal doctor on 03/15/2021                         [facility] Adult PT Treatment/Exercise - 03/02/21  0001      Lumbar Exercises: Stretches   Lower Trunk Rotation Limitations x 10      Lumbar Exercises: Aerobic   Nustep L4 x 5 min with UE/LE      Lumbar Exercises: Standing   Lifting 10 reps   2 sets   Lifting Weights (lbs) 10    Lifting Limitations deadlift from 8" step    Row 10 reps   2 sets   Theraband Level (Row) Level 2 (Red)    Shoulder Extension 10 reps   2 sets   Theraband Level (Shoulder Extension) Level 2 (Red)    Other Standing Lumbar Exercises Pallof press 2 x 10 with red      Lumbar Exercises: Seated   Long Arc Quad on Chair 10 reps   2 sets   Sit to Stand 10 reps   2 sets     Lumbar Exercises: Supine   Clam 10 reps   2 sets   Clam Limitations red band    Bent Knee Raise 10 reps   2 sets   Bent Knee Raise Limitations 2nd set with red band    Bridge 10 reps   2 sets   Bridge Limitations partial range    Straight Leg Raise 10  reps   2 sets                 PT Education - 03/02/21 1659    Education Details HEP update    Person(s) Educated Patient    Methods Explanation;Demonstration;Verbal cues;Handout    Comprehension Verbalized understanding;Need further instruction;Returned demonstration;Verbal cues required            PT Short Term Goals - 01/28/21 0819      PT SHORT TERM GOAL #1   Title Patient will be I with initial HEP to progress with PT    Baseline met for initial HEP    Time 4    Period Weeks    Status Achieved      PT SHORT TERM GOAL #2   Title Patient will be able to ambulate household distances using LRAD to improve independence with going to the bathroom    Baseline Patient able to ambulate throughout household without limitation or AD    Time 4    Period Weeks    Status Achieved      PT SHORT TERM GOAL #3   Title Patient will be able to stand >/= 10 minutes to improve cooking ability    Baseline Patient reports ability to stand 10-15 minutes    Time 4    Period Weeks    Status Achieved      PT SHORT TERM GOAL #4   Title Patient will report </= 6/10 pain level with activity to reduce functional limitation    Baseline Patient continues to report high levels of pain    Time 3    Period Weeks    Status On-going    Target Date 02/18/21             PT Long Term Goals - 01/27/21 1724      PT LONG TERM GOAL #1   Title Patient will be I with final HEP to maintain progress from PT    Baseline Continuing to update HEP    Time 6    Period Weeks    Status On-going    Target Date 03/10/21      PT LONG TERM GOAL #2   Title Patient will be  able to ambulate community level distance with LRAD in order to perform shopping tasks    Baseline Patient ambulating in community without AD but continues to be limited and requires rest breaks due to pain    Time 6    Period Weeks    Status On-going    Target Date 03/10/21      PT LONG TERM GOAL #3   Title Patient will be able  to perform all bathing, dressing, and grooming tasks without assistance    Time 8    Period Weeks    Status Achieved      PT LONG TERM GOAL #4   Title Patient will report </= 3/10 pain level with activity to reduce functional limitation    Baseline Patient continues to high levels of pain with activity    Time 6    Period Weeks    Status On-going    Target Date 03/10/21      PT LONG TERM GOAL #5   Title Patient will report improved functional status >/= 49% on FOTO    Baseline Not asssessd due to time limitations    Time 6    Period Weeks    Status On-going    Target Date 03/10/21      Additional Long Term Goals   Additional Long Term Goals Yes      PT LONG TERM GOAL #6   Title Patient will be able to stand >/= 30 minutes without seated rest break to improve cooking ability    Time 6    Period Weeks    Status New    Target Date 03/10/21      PT LONG TERM GOAL #7   Title Patient will be able to lift >/= 10 lbs with proper technique to improve performing of household tasks    Time 6    Period Weeks    Status New    Target Date 03/10/21                 Plan - 03/02/21 1700    Clinical Impression Statement Patient tolerated therapy well with no adverse effects. Therapy focused primarily on progressing ther-ex to improve mobility and strength. She reports low back pain with all exercise but it does not increase and patient was able to perform all exercises without taking breaks and with good tolerance. Incorporated band postural strengthening and lifting this visit. HEP updated and patient continued to be encouraged to gradually progress activity level. Patient would benefit from continued skilled PT to progress mobility and strength to reduce pain and maximize functional ability.    PT Treatment/Interventions ADLs/Self Care Home Management;Cryotherapy;Electrical Stimulation;Iontophoresis 69m/ml Dexamethasone;Moist Heat;Ultrasound;Traction;Neuromuscular  re-education;Balance training;Therapeutic exercise;Therapeutic activities;Functional mobility training;Stair training;Gait training;Patient/family education;Manual techniques;Dry needling;Passive range of motion;Taping;Spinal Manipulations;Joint Manipulations    PT Next Visit Plan POC re-cert, continue dry needling, continue therex for postural/core general strengthening and endurance    PT Home Exercise Plan [email or web address]    Consulted and Agree with Plan of Care Patient           Patient will benefit from skilled therapeutic intervention in order to improve the following deficits and impairments:  Abnormal gait,Decreased range of motion,Difficulty walking,Pain,Decreased activity tolerance,Postural dysfunction,Decreased strength,Decreased mobility  Visit Diagnosis: Acute bilateral low back pain, unspecified whether sciatica present  Cervicalgia  Pain in left hip  Acute pain of left knee  Other abnormalities of gait and mobility     Problem List Patient Active Problem List   Diagnosis Date Noted  .  Post-operative state 01/25/2019  . ARF (acute renal failure) ([place]) 04/24/2018  . Acute renal failure (ARF) ([place]) 04/22/2018  . Hypercalcemia 05/31/2016  . Hypokalemia 05/31/2016  . Hypochloremia 05/31/2016  . Screening for HIV (human immunodeficiency virus) 05/31/2016  . Screen for STD (sexually transmitted disease) 05/31/2016  . Vitamin D deficiency 05/31/2016  . Chronic insomnia 05/31/2016  . Absolute anemia 07/07/2015  . Clinical depression 07/07/2015  . Essential hypertension 07/07/2015  . Acute anxiety 07/07/2015  . Primary fibromyalgia syndrome 07/07/2015  . Embolic stroke ([place]) 97/53/0051    Hilda Blades, PT, DPT, LAT, ATC 03/02/21  6:01 PM Phone: [phone] Fax: [place] [facility] [address] [place], [place], [address] Phone: [phone]   Fax:  [phone]  Name: JAZMAINE FUELLING MRN:  [id number] Date of Birth: 22-Jul-1966

## 2021-03-09 ENCOUNTER — Other Ambulatory Visit: Payer: Self-pay

## 2021-03-09 ENCOUNTER — Encounter: Payer: Self-pay | Admitting: Physical Therapy

## 2021-03-09 ENCOUNTER — Ambulatory Visit: Payer: Medicare Other | Attending: Family Medicine | Admitting: Physical Therapy

## 2021-03-09 DIAGNOSIS — M25552 Pain in left hip: Secondary | ICD-10-CM | POA: Diagnosis present

## 2021-03-09 DIAGNOSIS — R2689 Other abnormalities of gait and mobility: Secondary | ICD-10-CM

## 2021-03-09 DIAGNOSIS — M545 Low back pain, unspecified: Secondary | ICD-10-CM

## 2021-03-09 DIAGNOSIS — M542 Cervicalgia: Secondary | ICD-10-CM | POA: Insufficient documentation

## 2021-03-09 DIAGNOSIS — M25562 Pain in left knee: Secondary | ICD-10-CM | POA: Insufficient documentation

## 2021-03-09 NOTE — Therapy (Addendum)
[place], [place], [address] Phone: [phone]   Fax:  [phone]  Physical Therapy Treatment / re-certification / Discharge  Patient Details  Name: Victoria Guzman MRN: [id number] Date of Birth: 26-Jun-1966 Referring Provider (PT): Eunice Blase, MD   Encounter Date: 03/09/2021   PT End of Session - 03/09/21 1642    Visit Number 13    Number of Visits 17    Date for PT Re-Evaluation 04/06/21    Authorization Type [employer] MCR    Progress Note Due on Visit 20    PT Start Time 1630    PT Stop Time 8850    PT Time Calculation (min) 45 min    Activity Tolerance Patient tolerated treatment well    Behavior During Therapy [facility] for tasks assessed/performed           Past Medical History:  Diagnosis Date  . Anemia   . Anxiety   . Chronic fatigue   . Chronic insomnia 05/31/2016  . Depression   . Family history of adverse reaction to anesthesia    mother difficult to wake up after CABG  . Fibromyalgia   . Hypertension   . Insomnia   . Panic attack   . Stroke ([place])    tia  . Vitamin D deficiency 05/31/2016    Past Surgical History:  Procedure Laterality Date  . ABDOMINAL HYSTERECTOMY    . COLONOSCOPY WITH PROPOFOL N/A 07/24/2018   Procedure: COLONOSCOPY WITH PROPOFOL;  Surgeon: Toledo, Benay Pike, MD;  Location: [facility];  Service: Gastroenterology;  Laterality: N/A;  . ESOPHAGOGASTRODUODENOSCOPY (EGD) WITH PROPOFOL N/A 07/24/2018   Procedure: ESOPHAGOGASTRODUODENOSCOPY (EGD) WITH PROPOFOL;  Surgeon: Toledo, Benay Pike, MD;  Location: [facility];  Service: Gastroenterology;  Laterality: N/A;  . LAPAROSCOPIC BILATERAL SALPINGECTOMY Bilateral 01/25/2019   Procedure: LAPAROSCOPIC BILATERAL SALPINGECTOMY;  Surgeon: Schermerhorn, Gwen Her, MD;  Location: [facility] ORS;  Service: Gynecology;  Laterality: Bilateral;  . PARATHYROIDECTOMY    . PARTIAL HYSTERECTOMY    . TRACHELECTOMY N/A 01/25/2019    Procedure: TRACHELECTOMY, laparoscopic cervical;  Surgeon: Schermerhorn, Gwen Her, MD;  Location: [facility] ORS;  Service: Gynecology;  Laterality: N/A;    There were no vitals filed for this visit.   Subjective Assessment - 03/09/21 1635    Subjective "I am still having back and L knee pain that seems to be continuing to get worse with no changes."    Patient Stated Goals Get life back    Currently in Pain? Yes    Pain Score 6     Pain Location Back    Pain Orientation Lower    Pain Onset More than a month ago    Pain Frequency Constant    Aggravating Factors  everything    Pain Score 7    Pain Location Knee    Pain Orientation Left    Aggravating Factors  walking/ standing, lifting    Pain Relieving Factors medication,              [facility] PT Assessment - 03/09/21 0001      Assessment   Medical Diagnosis Acute left-sided low back pain, Pain in left hip, Acute pain of left knee, Neck pain    Referring Provider (PT) Eunice Blase, MD    Onset Date/Surgical Date 10/18/20    Next MD Visit Patient reports she is going to spinal doctor on 03/15/2021  [facility] Adult PT Treatment/Exercise - 03/09/21 0001      Self-Care   Self-Care Other Self-Care Comments    Other Self-Care Comments  PNE pain doesn't equal damange, redirection of focus to calm down pain during activity.      Lumbar Exercises: Seated   Long Arc Quad on Chair 10 reps   with ball squeeze   Sit to Stand 10 reps   with green band around the knees, cues to keep core tight cues to rock forward and reach   Other Seated Lumbar Exercises marching 2 x 10                  PT Education - 03/09/21 1730    Education Details Reviewed HEP and time spent for PNE and benefits of graded exercise.    Person(s) Educated Patient    Methods Explanation;Verbal cues;Handout    Comprehension Verbalized understanding;Verbal cues required            PT Short Term Goals - 03/09/21 1637       PT SHORT TERM GOAL #1   Title Patient will be I with initial HEP to progress with PT    Period Weeks    Status Achieved      PT SHORT TERM GOAL #2   Title Patient will be able to ambulate household distances using LRAD to improve independence with going to the bathroom    Period Weeks    Status Achieved      PT SHORT TERM GOAL #3   Title Patient will be able to stand >/= 10 minutes to improve cooking ability    Status Achieved      PT SHORT TERM GOAL #4   Title Patient will report </= 6/10 pain level with activity to reduce functional limitation    Period Weeks    Status On-going             PT Long Term Goals - 03/09/21 1638      PT LONG TERM GOAL #1   Title Patient will be I with final HEP to maintain progress from PT    Period Weeks    Status On-going      PT LONG TERM GOAL #2   Title Patient will be able to ambulate community level distance with LRAD in order to perform shopping tasks    Period Weeks    Status Achieved      PT LONG TERM GOAL #3   Title Patient will be able to perform all bathing, dressing, and grooming tasks without assistance    Baseline daughter helps of and on    Period Weeks    Status Partially Met      PT LONG TERM GOAL #4   Title Patient will report </= 3/10 pain level with activity to reduce functional limitation    Period Weeks    Status On-going      PT LONG TERM GOAL #5   Title Patient will report improved functional status >/= 49% on FOTO    Period Weeks    Status On-going      PT LONG TERM GOAL #6   Title Patient will be able to stand >/= 30 minutes without seated rest break to improve cooking ability    Baseline 15 min    Period Weeks    Status On-going      PT LONG TERM GOAL #7   Title Patient will be able to lift >/= 10 lbs with proper technique to  improve performing of household tasks    Period Weeks    Status On-going                 Plan - 03/09/21 1725    Clinical Impression Statement pt reports continued  neck, back and L knee pain. She has met most of her STG's and only LTG #2, but is motivated to improve and progres, her FOTO score improved to 36% function. Focused session on PNE techniques and hip/ knee strengthening redirecting focus which she demonstrated improvement in her exercise and noted reduction in pain during activity. pt would benefit from continued physical therapy to work on Pain neuroscience, gross hip/ core strengthening, and maximize her function by addressing the deficits listed.    PT Frequency 1x / week    PT Duration 4 weeks    PT Treatment/Interventions ADLs/Self Care Home Management;Cryotherapy;Electrical Stimulation;Iontophoresis 3m/ml Dexamethasone;Moist Heat;Ultrasound;Traction;Neuromuscular re-education;Balance training;Therapeutic exercise;Therapeutic activities;Functional mobility training;Stair training;Gait training;Patient/family education;Manual techniques;Dry needling;Passive range of motion;Taping;Spinal Manipulations;Joint Manipulations    PT Next Visit Plan continue dry needling, continue therex for postural/core general strengthening and endurance    PT Home Exercise Plan [id number]    Consulted and Agree with Plan of Care Patient           Patient will benefit from skilled therapeutic intervention in order to improve the following deficits and impairments:  Abnormal gait,Decreased range of motion,Difficulty walking,Pain,Decreased activity tolerance,Postural dysfunction,Decreased strength,Decreased mobility  Visit Diagnosis: Acute bilateral low back pain, unspecified whether sciatica present  Cervicalgia  Pain in left hip  Other abnormalities of gait and mobility  Acute pain of left knee     Problem List Patient Active Problem List   Diagnosis Date Noted  . Post-operative state 01/25/2019  . AR[place] (acute renal failure) ([place] 04/24/2018  . Acute renal failure (ARF) ([place] 04/22/2018  . Hypercalcemia 05/31/2016  . Hypokalemia 05/31/2016  .  Hypochloremia 05/31/2016  . Screening for HIV (human immunodeficiency virus) 05/31/2016  . Screen for STD (sexually transmitted disease) 05/31/2016  . Vitamin D deficiency 05/31/2016  . Chronic insomnia 05/31/2016  . Absolute anemia 07/07/2015  . Clinical depression 07/07/2015  . Essential hypertension 07/07/2015  . Acute anxiety 07/07/2015  . Primary fibromyalgia syndrome 07/07/2015  . Embolic stroke ([place] 035/45/6256   KStarr LakePT, DPT, LAT, ATC  03/09/21  5:34 PM      [facility]GVred[place] [address]Phone: [phone]  Fax:  [phone] Name: MVAUNDA GUTTERMANMRN: [id number] of Birth: [birth year]      PHYSICAL THERAPY DISCHARGE SUMMARY  Visits from Start of Care: 13  Current functional level related to goals / functional outcomes: See goals   Remaining deficits: Current status unknown   Education / Equipment: HEP  Plan: Patient agrees to discharge.  Patient goals were not met. Patient is being discharged due to not returning since the last visit.  ?????         Naisha Wisdom PT, DPT, LAT, ATC  04/10/21  4:32 PM

## 2021-03-10 ENCOUNTER — Telehealth: Payer: Self-pay | Admitting: Family Medicine

## 2021-03-10 NOTE — Telephone Encounter (Signed)
I called and reached the patient's voice mail - advised her to call me back or send a message through MyChart and I will try to help her with the issue.

## 2021-03-10 NOTE — Telephone Encounter (Signed)
Patient called requesting a call back from terri concerning physical therapy. Patient was irritated. I explained that I spoke with [place] and she received a hard copy of referral. Pleas call patient for clarification for referral for hands pt. Patient phone number is [phone].

## 2021-03-10 NOTE — Telephone Encounter (Signed)
I spoke with the patient. She has not heard anything from [facility] about an appointment yet. The patient does still have her hard copy, but I did give her the phone number so that she may call them. The order was faxed by [place] on 02/24/21.

## 2021-03-30 ENCOUNTER — Ambulatory Visit: Payer: Medicare Other | Admitting: Physical Therapy

## 2021-04-06 ENCOUNTER — Ambulatory Visit (INDEPENDENT_AMBULATORY_CARE_PROVIDER_SITE_OTHER): Payer: Medicare Other | Admitting: Family Medicine

## 2021-04-06 ENCOUNTER — Encounter: Payer: Self-pay | Admitting: Family Medicine

## 2021-04-06 ENCOUNTER — Other Ambulatory Visit: Payer: Self-pay

## 2021-04-06 DIAGNOSIS — M25562 Pain in left knee: Secondary | ICD-10-CM

## 2021-04-06 DIAGNOSIS — M545 Low back pain, unspecified: Secondary | ICD-10-CM | POA: Diagnosis not present

## 2021-04-06 DIAGNOSIS — M25552 Pain in left hip: Secondary | ICD-10-CM

## 2021-04-06 DIAGNOSIS — M542 Cervicalgia: Secondary | ICD-10-CM

## 2021-04-06 MED ORDER — IBUPROFEN 800 MG PO TABS: 800.0000 mg | ORAL_TABLET | Freq: Three times a day (TID) | ORAL | 3 refills | Status: DC | PRN

## 2021-04-06 MED ORDER — OXYCODONE-ACETAMINOPHEN 5-325 MG PO TABS
1.0000 | ORAL_TABLET | Freq: Three times a day (TID) | ORAL | 0 refills | Status: DC | PRN
Start: 2021-04-06 — End: 2022-06-14
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
## 2021-04-08 ENCOUNTER — Encounter: Payer: Medicare Other | Admitting: Physical Therapy

## 2021-04-08 ENCOUNTER — Telehealth: Payer: Self-pay

## 2021-04-08 NOTE — Telephone Encounter (Signed)
Patient called stated the place she was referred to ([facility]) does not accept her insurance. Would like to know what other options she has. Please advise.  She is aware he is out of the office until Tuesday.

## 2021-04-12 NOTE — Telephone Encounter (Signed)
I notified the patient. Will try getting [facility] in to [facility], as per original plan. Martie Lee is calling them.

## 2021-04-12 NOTE — Telephone Encounter (Signed)
Victoria Guzman just now refaxed the order. They are keeping a lookout for that and will call the patient soon.

## 2021-04-12 NOTE — Telephone Encounter (Signed)
Please advise 

## 2021-04-13 ENCOUNTER — Encounter: Payer: Medicare Other | Admitting: Physical Therapy

## 2021-06-15 ENCOUNTER — Ambulatory Visit: Payer: Medicare Other | Admitting: Family Medicine

## 2021-06-16 ENCOUNTER — Ambulatory Visit: Payer: Medicare Other | Admitting: Family Medicine

## 2021-06-22 ENCOUNTER — Encounter: Payer: Self-pay | Admitting: Family Medicine

## 2021-06-22 ENCOUNTER — Ambulatory Visit (INDEPENDENT_AMBULATORY_CARE_PROVIDER_SITE_OTHER): Payer: Medicare Other | Admitting: Family Medicine

## 2021-06-22 ENCOUNTER — Other Ambulatory Visit: Payer: Self-pay

## 2021-06-22 DIAGNOSIS — M25562 Pain in left knee: Secondary | ICD-10-CM

## 2021-06-22 DIAGNOSIS — M25552 Pain in left hip: Secondary | ICD-10-CM | POA: Diagnosis not present

## 2021-06-22 DIAGNOSIS — M542 Cervicalgia: Secondary | ICD-10-CM

## 2021-06-22 DIAGNOSIS — M545 Low back pain, unspecified: Secondary | ICD-10-CM | POA: Diagnosis not present

## 2021-06-22 MED ORDER — BACLOFEN 10 MG PO TABS: 5.0000 mg | ORAL_TABLET | Freq: Three times a day (TID) | ORAL | 3 refills | Status: DC | PRN
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
## 2021-07-05 ENCOUNTER — Ambulatory Visit (HOSPITAL_BASED_OUTPATIENT_CLINIC_OR_DEPARTMENT_OTHER): Payer: Medicare Other | Attending: Family Medicine | Admitting: Physical Therapy

## 2021-07-05 ENCOUNTER — Encounter (HOSPITAL_BASED_OUTPATIENT_CLINIC_OR_DEPARTMENT_OTHER): Payer: Self-pay | Admitting: Physical Therapy

## 2021-07-05 ENCOUNTER — Other Ambulatory Visit: Payer: Self-pay

## 2021-07-05 DIAGNOSIS — M6281 Muscle weakness (generalized): Secondary | ICD-10-CM

## 2021-07-05 DIAGNOSIS — M542 Cervicalgia: Secondary | ICD-10-CM | POA: Diagnosis not present

## 2021-07-05 DIAGNOSIS — M545 Low back pain, unspecified: Secondary | ICD-10-CM

## 2021-07-05 DIAGNOSIS — M25562 Pain in left knee: Secondary | ICD-10-CM

## 2021-07-05 DIAGNOSIS — M25552 Pain in left hip: Secondary | ICD-10-CM | POA: Diagnosis not present
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
## 2021-07-07 ENCOUNTER — Other Ambulatory Visit: Payer: Self-pay | Admitting: Obstetrics and Gynecology

## 2021-07-07 DIAGNOSIS — Z1231 Encounter for screening mammogram for malignant neoplasm of breast: Secondary | ICD-10-CM

## 2021-07-12 ENCOUNTER — Encounter (HOSPITAL_BASED_OUTPATIENT_CLINIC_OR_DEPARTMENT_OTHER): Payer: Self-pay | Admitting: Physical Therapy

## 2021-07-12 ENCOUNTER — Ambulatory Visit (HOSPITAL_BASED_OUTPATIENT_CLINIC_OR_DEPARTMENT_OTHER): Payer: Medicare Other | Admitting: Physical Therapy

## 2021-07-12 ENCOUNTER — Other Ambulatory Visit: Payer: Self-pay

## 2021-07-12 DIAGNOSIS — M542 Cervicalgia: Secondary | ICD-10-CM | POA: Diagnosis not present

## 2021-07-12 DIAGNOSIS — M6281 Muscle weakness (generalized): Secondary | ICD-10-CM

## 2021-07-12 DIAGNOSIS — M25552 Pain in left hip: Secondary | ICD-10-CM

## 2021-07-12 DIAGNOSIS — M25562 Pain in left knee: Secondary | ICD-10-CM

## 2021-07-12 DIAGNOSIS — M545 Low back pain, unspecified: Secondary | ICD-10-CM

## 2021-07-12 NOTE — Therapy (Signed)
[facility] [address] [place], [place], [address] Phone: [phone]   Fax:  [phone]  Physical Therapy Treatment  Patient Details  Name: Victoria BusmanMelissa L Falconi MRN: [id number] Date of Birth: 1966/08/02 Referring Provider (PT): Lavada MesiMichael Hilts, MD   Encounter Date: 07/12/2021   PT End of Session - 07/12/21 2322     Visit Number 2    Number of Visits 16    Date for PT Re-Evaluation 08/03/21    PT Start Time 1354    PT Stop Time 1439    PT Time Calculation (min) 45 min    Equipment Utilized During Treatment Other (comment)   neck float, squoodles, ankle floats, kickboard   Activity Tolerance Patient tolerated treatment well    [facility] tasks assessed/performed             Past Medical History:  Diagnosis Date   Anemia    Anxiety    Chronic fatigue    Chronic insomnia 05/31/2016   Depression    Family history of adverse reaction to anesthesia    mother difficult to wake up after CABG   Fibromyalgia    Hypertension    Insomnia    Panic attack    Stroke (HCC)    tia   Vitamin D deficiency 05/31/2016    Past Surgical History:  Procedure Laterality Date   ABDOMINAL HYSTERECTOMY     COLONOSCOPY WITH PROPOFOL N/A 07/24/2018   Procedure: COLONOSCOPY WITH PROPOFOL;  Surgeon: Toledo, Boykin Nearingeodoro K, MD;  Location: [facility];  Service: Gastroenterology;  Laterality: N/A;   ESOPHAGOGASTRODUODENOSCOPY (EGD) WITH PROPOFOL N/A 07/24/2018   Procedure: ESOPHAGOGASTRODUODENOSCOPY (EGD) WITH PROPOFOL;  Surgeon: Toledo, Boykin Nearingeodoro K, MD;  Location: [facility];  Service: Gastroenterology;  Laterality: N/A;   LAPAROSCOPIC BILATERAL SALPINGECTOMY Bilateral 01/25/2019   Procedure: LAPAROSCOPIC BILATERAL SALPINGECTOMY;  Surgeon: Schermerhorn, Ihor Austinhomas J, MD;  Location: [facility];  Service: Gynecology;  Laterality: Bilateral;   PARATHYROIDECTOMY     PARTIAL HYSTERECTOMY     TRACHELECTOMY N/A 01/25/2019   Procedure:  TRACHELECTOMY, laparoscopic cervical;  Surgeon: Schermerhorn, Ihor Austinhomas J, MD;  Location: [facility];  Service: Gynecology;  Laterality: N/A;    There were no vitals filed for this visit.   Subjective Assessment - 07/12/21 1355     Subjective Pt reports she was sore after prior Rx.  Pt reports she is compliant with her prior land based HEP.  Pt states she is trying to stay out of her bed and sit more.   Pt has difficulty with sleeping. Pt is very limited with standing and ambulation due to pain. She is very limited with performing household chores including laundry, cleaning, and washing dishes due to pain. Pt is slow with performing stairs and has pain. Pt is limited and painful with grocery shopping. pt unable to hold her grandchild and very limited with playing grandchild. Someitimes she can barely lift herself off the toilet. Limited and painful with turning head. Limited with looking over shoulder while driving. Pt c/o's of constant pain.    Pertinent History TIA in 2015, Fibromyalgia, depression, Grade I anterolisthesis L4-5    How long can you sit comfortably? Very limited, always painful    How long can you stand comfortably? unable to stand comfortably.  has pain immediately with standing, not comfortable with any duration    How long can you walk comfortably? Significantly limited and painful    Diagnostic tests Cervical, lumbar, Thoracic, and Hip X rays.  Lumbar MRI:  Grade I anterolisthesis L4-5, moderate central canal stenosis and neuroforaminal narrowing, L3-4 mild DDD, synovial cyst buried in R ant medial facet jt at L4-5.  L hip MRI in April indicated no acute osseous abnormalities, possible L anterosuperior chondral labral fissure, possible capsular sprain and/or obutartor strain, and probable R glute medius calcific tendinosis.    Currently in Pain? Yes    Pain Score 5     Pain Location --   central lumbar and bilat lumbar flank   Pain Score 6    Pain Location Knee    Pain Orientation  Right;Left    Pain Score 5    Pain Location Neck    Pain Score 7    Pain Location Hip    Pain Orientation Left            Pt seen for aquatic therapy today.  Treatment took place in water 3.5-4.25 ft in depth at the [facility] pool. Temp of water was 94.  Pt entered/exited the pool via stairs independently with bilat rail.   Reviewed response to prior Rx, HEP compliance, pain level, and current function. Introduction to water.  Pt stood at different levels in order to feel the buoyancy.    -Pt ambulated 3 laps, sidestepped 2 laps, and ambulated bwd x 2 laps with verbal and visual instruction for reciprocal arm swing.   -Pt performed:     -marching 2 x 10 reps with bilat UE assist on pool edge.     -Standing Hip flex x10 reps and standing hip abd x10 reps with UE assist on pool edge.      -Kickboard push/pull with TrA contraction 2x10 reps.     -Passive floating with ankle floats, neck float,, and 2 squoodles to promote relaxation and improve pain, tightness, and muscle tension.   Pt requires buoyancy for support and to offload joints with strengthening exercises. Viscosity of the water is needed for resistance of strengthening; water current perturbations provides challenge to standing balance unsupported, requiring increased core activation.       PT Education - 07/12/21 2321     Education Details Educated pt on the properties and benefits of aquatic therapy including buoyance and viscosity for improved core and LE strength, balance, and reduced stress on joints.  Instructed pt in correct form with exercises.    Person(s) Educated Patient    Methods Explanation;Demonstration;Verbal cues    Comprehension Returned demonstration;Verbal cues required;Verbalized understanding              PT Short Term Goals - 07/06/21 0841       PT SHORT TERM GOAL #1   Title Pt will tolerate initiation of aquatic therapy without adverse effects for improved mobility and  tolerance to activity.    Time 2    Period Weeks    Status New    Target Date 07/19/21      PT SHORT TERM GOAL #2   Title Pt will report at least a 25% improvement in pain/sx's and mobility overall.    Time 3    Period Weeks    Status New    Target Date 07/27/21      PT SHORT TERM GOAL #3   Title Pt will report she is able to stand for longer duration including to wash her dishes and perform laundry without sitting down with reduced pain.    Time 4    Period Weeks    Status New    Target Date 08/02/21  PT SHORT TERM GOAL #4   Title Pt will demo improved cervical AROM to 35 deg in L SB'ing and WFL in R rotation and improved lumbar AROM by at least 25% in flex, SB'ing, and rotation for improved daily mobility.    Time 4    Period Weeks    Status New    Target Date 08/02/21      PT SHORT TERM GOAL #5   Title Pt will demo improved speed and ease with sit/stand transfers and report she is able to perform toilet transfers without difficulty.    Time 4    Period Weeks    Status New    Target Date 08/02/21               PT Long Term Goals - 07/06/21 0954       PT LONG TERM GOAL #1   Title Pt will be independent with aquatic HEP for improved pain, strength, tolerance to activity, and mobility.    Time 8    Period Weeks    Status New    Target Date 08/30/21      PT LONG TERM GOAL #2   Title Pt will report she is able to perform her normal daily transfers without difficulty or significant pain.    Time 8    Period Weeks    Status New    Target Date 08/30/21      PT LONG TERM GOAL #3   Title Pt will be able to ambulate community distance including grocery shopping without significant limitation or pain.    Time 8    Period Weeks    Status New    Target Date 08/30/21      PT LONG TERM GOAL #4   Title Pt will be able to stand to perform her normal household chores without significant limitation.    Time 8    Period Weeks    Status New    Target Date  08/30/21      PT LONG TERM GOAL #5   Title Pt will be able to take of her grandchild with improved ease and less pain.    Time 8    Period Weeks    Status New    Target Date 08/30/21      Additional Long Term Goals   Additional Long Term Goals Yes      PT LONG TERM GOAL #6   Title Pt will demo improved LE strength to 4/5 MMT in L hip and knee and 5/5 in R knee for improved tolerance with and performance of functional mobility.    Time 8    Period Weeks    Status New    Target Date 08/30/21                   Plan - 07/12/21 2325     Clinical Impression Statement Initiated Aquatic Therapy today and Pt tolerated Rx well.  PT educatd pt with the properties and benefits of water.  Pt performed aquatic exercises well with instruction and cuing for correct form.  She gave good effort with all exercises.  Pt states she felt good while passive floating.  Pt states she felt good after after Rx and her pain was a little worse.  Pt may benefit from cont skilled PT services/aquatic therapy to address ongoing goals, improve tolerance to activity, and improve functional mobility.    Comorbidities Fibromyalgia, depression    PT Treatment/Interventions ADLs/Self Care Home Management;Aquatic  Therapy;Cryotherapy;Electrical Stimulation;Ultrasound;Moist Heat;Gait training;Stair training;Functional mobility training;Therapeutic activities;Therapeutic exercise;Neuromuscular re-education;Balance training;Manual techniques;Patient/family education;Passive range of motion;Dry needling    PT Next Visit Plan Will give Oswestry and NDI next visit.  Cont with Aquatic therapy.    PT Home Exercise Plan Pt has an extensive HEP from her prior land based PT.    Consulted and Agree with Plan of Care Patient             Patient will benefit from skilled therapeutic intervention in order to improve the following deficits and impairments:  Decreased activity tolerance, Decreased mobility, Decreased endurance,  Decreased range of motion, Decreased strength, Difficulty walking, Increased fascial restricitons, Postural dysfunction, Pain  Visit Diagnosis: Low back pain, unspecified back pain laterality, unspecified chronicity, unspecified whether sciatica present  Cervicalgia  Muscle weakness (generalized)  Pain in left hip  Left knee pain, unspecified chronicity     Problem List Patient Active Problem List   Diagnosis Date Noted   Post-operative state 01/25/2019   ARF (acute renal failure) (HCC) 04/24/2018   Acute renal failure (ARF) (HCC) 04/22/2018   Hypercalcemia 05/31/2016   Hypokalemia 05/31/2016   Hypochloremia 05/31/2016   Screening for HIV (human immunodeficiency virus) 05/31/2016   Screen for STD (sexually transmitted disease) 05/31/2016   Vitamin D deficiency 05/31/2016   Chronic insomnia 05/31/2016   Absolute anemia 07/07/2015   Clinical depression 07/07/2015   Essential hypertension 07/07/2015   Acute anxiety 07/07/2015   Primary fibromyalgia syndrome 07/07/2015   Embolic stroke (HCC) 07/17/2014    Audie Clear III PT, DPT 07/12/21 11:38 PM   [facility] [address] [place], [place], [address] Phone: [phone]   Fax:  [phone]  Name: SHAKIA SEBASTIANO MRN: [id number] Date of Birth: 01/04/1966

## 2021-07-13 ENCOUNTER — Ambulatory Visit
Admission: RE | Admit: 2021-07-13 | Discharge: 2021-07-13 | Disposition: A | Discharge status: Home / Self Care | Payer: Medicare Other | Source: Ambulatory Visit | Attending: Obstetrics and Gynecology | Admitting: Obstetrics and Gynecology

## 2021-07-13 DIAGNOSIS — Z1231 Encounter for screening mammogram for malignant neoplasm of breast: Secondary | ICD-10-CM | POA: Insufficient documentation

## 2021-07-14 ENCOUNTER — Other Ambulatory Visit: Payer: Self-pay

## 2021-07-14 ENCOUNTER — Ambulatory Visit (HOSPITAL_BASED_OUTPATIENT_CLINIC_OR_DEPARTMENT_OTHER): Payer: Medicare Other | Admitting: Physical Therapy

## 2021-07-14 DIAGNOSIS — M542 Cervicalgia: Secondary | ICD-10-CM

## 2021-07-14 DIAGNOSIS — M6281 Muscle weakness (generalized): Secondary | ICD-10-CM

## 2021-07-14 DIAGNOSIS — M25552 Pain in left hip: Secondary | ICD-10-CM

## 2021-07-14 DIAGNOSIS — M25562 Pain in left knee: Secondary | ICD-10-CM

## 2021-07-14 DIAGNOSIS — M545 Low back pain, unspecified: Secondary | ICD-10-CM

## 2021-07-14 NOTE — Therapy (Signed)
[facility] [address] [place], [place], [address] Phone: [phone]   Fax:  [phone]  Physical Therapy Treatment  Patient Details  Name: Victoria Guzman MRN: [id number] Date of Birth: 11/12/1966 Referring Provider (PT): Lavada Mesi, MD   Encounter Date: 07/14/2021   PT End of Session - 07/14/21 1710     Visit Number 3    Number of Visits 16    Date for PT Re-Evaluation 08/03/21    PT Start Time 1616    PT Stop Time 1701    PT Time Calculation (min) 45 min    Equipment Utilized During Treatment Other (comment)   neck float, squoodles, ankle floats, kickboard   Activity Tolerance Patient tolerated treatment well    Behavior During Therapy [facility] for tasks assessed/performed             Past Medical History:  Diagnosis Date   Anemia    Anxiety    Chronic fatigue    Chronic insomnia 05/31/2016   Depression    Family history of adverse reaction to anesthesia    mother difficult to wake up after CABG   Fibromyalgia    Hypertension    Insomnia    Panic attack    Stroke (HCC)    tia   Vitamin D deficiency 05/31/2016    Past Surgical History:  Procedure Laterality Date   ABDOMINAL HYSTERECTOMY     COLONOSCOPY WITH PROPOFOL N/A 07/24/2018   Procedure: COLONOSCOPY WITH PROPOFOL;  Surgeon: Toledo, Boykin Nearing, MD;  Location: [facility];  Service: Gastroenterology;  Laterality: N/A;   ESOPHAGOGASTRODUODENOSCOPY (EGD) WITH PROPOFOL N/A 07/24/2018   Procedure: ESOPHAGOGASTRODUODENOSCOPY (EGD) WITH PROPOFOL;  Surgeon: Toledo, Boykin Nearing, MD;  Location: [facility];  Service: Gastroenterology;  Laterality: N/A;   LAPAROSCOPIC BILATERAL SALPINGECTOMY Bilateral 01/25/2019   Procedure: LAPAROSCOPIC BILATERAL SALPINGECTOMY;  Surgeon: Schermerhorn, Ihor Austin, MD;  Location: [facility];  Service: Gynecology;  Laterality: Bilateral;   PARATHYROIDECTOMY     PARTIAL HYSTERECTOMY     TRACHELECTOMY N/A 01/25/2019   Procedure:  TRACHELECTOMY, laparoscopic cervical;  Surgeon: Schermerhorn, Ihor Austin, MD;  Location: [facility];  Service: Gynecology;  Laterality: N/A;    There were no vitals filed for this visit.   Subjective Assessment - 07/14/21 1617     Subjective Pt states she was sore after prior Rx though had no increased pain.  Pt states "This was a good thing" in reference to aquatic therapy.  She states it was a good Rx for her and she felt like she could do more after last Rx.  Pt reports it was a little easier performing toilet transfers and stairs after prior Rx.  Pt went to grocery store and picked up a couple of items after prior Rx.  Pt is very limited with standing and ambulation due to pain. She is very limited with performing household chores including laundry, cleaning, and washing dishes due to pain. Pt is slow with performing stairs and has pain. Pt is limited and painful with grocery shopping. pt unable to hold her grandchild and very limited with playing grandchild.  Pt is limited with looking over shoulder while driving.  Pt states she is performing her prior land based HEP.    Pertinent History TIA in 2015, Fibromyalgia, depression, Grade I anterolisthesis L4-5    Diagnostic tests Cervical, lumbar, Thoracic, and Hip X rays.  Lumbar MRI:  Grade I anterolisthesis L4-5, moderate central canal stenosis and neuroforaminal narrowing, L3-4 mild DDD, synovial cyst buried  in R ant medial facet jt at L4-5.  L hip MRI in April indicated no acute osseous abnormalities, possible L anterosuperior chondral labral fissure, possible capsular sprain and/or obutartor strain, and probable R glute medius calcific tendinosis.    Currently in Pain? Yes    Pain Score 4     Pain Location --   central lumbar and bilat lumbar flank   Pain Score 4    Pain Location Knee    Pain Orientation Left;Right    Pain Score 5    Pain Location Neck    Pain Score 4    Pain Location Hip    Pain Orientation Left              Pt seen  for aquatic therapy today.  Treatment took place in water 3.5-4 ft in depth at the [facility] pool. Temp of water was 94.  Pt entered/exited the pool via stairs independently with bilat rail.   Reviewed response to prior Rx, HEP compliance, pain level, and current function.   -Pt ambulated 3 laps, sidestepped 2 laps, and ambulated bwd x 2 laps with verbal and visual instruction for reciprocal arm swing.   -Pt performed:     -marching 2 x 10 reps with bilat UE assist on pool edge.     -Standing Hip flex 2x10 reps and standing hip abd 2x10 reps with UE assist on pool edge.      -Kickboard push/pull with TrA contraction 2x10 reps.     -seated bicycles x 20 reps     -Passive floating with ankle floats, neck float,, and 2 squoodles to promote relaxation and improve pain, tightness, and muscle tension.    Pt requires buoyancy for support and to offload joints with strengthening exercises. Viscosity of the water is needed for resistance of strengthening; water current perturbations provides challenge to standing balance unsupported, requiring increased core activation.        PT Education - 07/14/21 1709     Education Details Instructed pt in correct form with exercises.  Educated pt in POC.    Person(s) Educated Patient    Methods Explanation;Demonstration;Verbal cues    Comprehension Returned demonstration;Verbalized understanding              PT Short Term Goals - 07/06/21 0841       PT SHORT TERM GOAL #1   Title Pt will tolerate initiation of aquatic therapy without adverse effects for improved mobility and tolerance to activity.    Time 2    Period Weeks    Status New    Target Date 07/19/21      PT SHORT TERM GOAL #2   Title Pt will report at least a 25% improvement in pain/sx's and mobility overall.    Time 3    Period Weeks    Status New    Target Date 07/27/21      PT SHORT TERM GOAL #3   Title Pt will report she is able to stand for longer duration  including to wash her dishes and perform laundry without sitting down with reduced pain.    Time 4    Period Weeks    Status New    Target Date 08/02/21      PT SHORT TERM GOAL #4   Title Pt will demo improved cervical AROM to 35 deg in L SB'ing and WFL in R rotation and improved lumbar AROM by at least 25% in flex, SB'ing, and rotation for improved daily  mobility.    Time 4    Period Weeks    Status New    Target Date 08/02/21      PT SHORT TERM GOAL #5   Title Pt will demo improved speed and ease with sit/stand transfers and report she is able to perform toilet transfers without difficulty.    Time 4    Period Weeks    Status New    Target Date 08/02/21               PT Long Term Goals - 07/06/21 0954       PT LONG TERM GOAL #1   Title Pt will be independent with aquatic HEP for improved pain, strength, tolerance to activity, and mobility.    Time 8    Period Weeks    Status New    Target Date 08/30/21      PT LONG TERM GOAL #2   Title Pt will report she is able to perform her normal daily transfers without difficulty or significant pain.    Time 8    Period Weeks    Status New    Target Date 08/30/21      PT LONG TERM GOAL #3   Title Pt will be able to ambulate community distance including grocery shopping without significant limitation or pain.    Time 8    Period Weeks    Status New    Target Date 08/30/21      PT LONG TERM GOAL #4   Title Pt will be able to stand to perform her normal household chores without significant limitation.    Time 8    Period Weeks    Status New    Target Date 08/30/21      PT LONG TERM GOAL #5   Title Pt will be able to take of her grandchild with improved ease and less pain.    Time 8    Period Weeks    Status New    Target Date 08/30/21      Additional Long Term Goals   Additional Long Term Goals Yes      PT LONG TERM GOAL #6   Title Pt will demo improved LE strength to 4/5 MMT in L hip and knee and 5/5 in R knee  for improved tolerance with and performance of functional mobility.    Time 8    Period Weeks    Status New    Target Date 08/30/21                   Plan - 07/14/21 1710     Clinical Impression Statement Pt was pleased with prior aquatic Rx and her response.  She responded well to prior aquatic Rx as evidenced by subjective reports.  She reports improved mobility and tolerance to activity after prior Rx.  Progressed aquatic exercise intensity with increasing reps/sets with selected exercises and adding seated bicycles.  Pt performed exercises well with cuing for correct form.  Pt responded well to Rx having no c/o's after Rx.  Pt should benefit from cont skilled PT services/aquatic therapy to improve function and tolerance to activity and to address ongoing goals.    PT Treatment/Interventions ADLs/Self Care Home Management;Aquatic Therapy;Cryotherapy;Electrical Stimulation;Ultrasound;Moist Heat;Gait training;Stair training;Functional mobility training;Therapeutic activities;Therapeutic exercise;Neuromuscular re-education;Balance training;Manual techniques;Patient/family education;Passive range of motion;Dry needling    PT Next Visit Plan Will give Oswestry and NDI next visit.  Cont with Aquatic therapy.    PT Home Exercise  Plan Pt has an extensive HEP from her prior land based PT.    Consulted and Agree with Plan of Care Patient             Patient will benefit from skilled therapeutic intervention in order to improve the following deficits and impairments:  Decreased activity tolerance, Decreased mobility, Decreased endurance, Decreased range of motion, Decreased strength, Difficulty walking, Increased fascial restricitons, Postural dysfunction, Pain  Visit Diagnosis: Low back pain, unspecified back pain laterality, unspecified chronicity, unspecified whether sciatica present  Cervicalgia  Muscle weakness (generalized)  Pain in left hip  Left knee pain, unspecified  chronicity     Problem List Patient Active Problem List   Diagnosis Date Noted   Post-operative state 01/25/2019   ARF (acute renal failure) (HCC) 04/24/2018   Acute renal failure (ARF) (HCC) 04/22/2018   Hypercalcemia 05/31/2016   Hypokalemia 05/31/2016   Hypochloremia 05/31/2016   Screening for HIV (human immunodeficiency virus) 05/31/2016   Screen for STD (sexually transmitted disease) 05/31/2016   Vitamin D deficiency 05/31/2016   Chronic insomnia 05/31/2016   Absolute anemia 07/07/2015   Clinical depression 07/07/2015   Essential hypertension 07/07/2015   Acute anxiety 07/07/2015   Primary fibromyalgia syndrome 07/07/2015   Embolic stroke (HCC) 07/17/2014    Audie Clear III PT, DPT 07/14/21 5:19 PM   [facility] [address] [place], [place], [address] Phone: [phone]   Fax:  [phone]  Name: TILLIE VIVERETTE MRN: [id number] Date of Birth: Sep 05, 1966

## 2021-07-20 ENCOUNTER — Other Ambulatory Visit: Payer: Self-pay

## 2021-07-20 ENCOUNTER — Encounter (HOSPITAL_BASED_OUTPATIENT_CLINIC_OR_DEPARTMENT_OTHER): Payer: Self-pay | Admitting: Physical Therapy

## 2021-07-20 ENCOUNTER — Ambulatory Visit (HOSPITAL_BASED_OUTPATIENT_CLINIC_OR_DEPARTMENT_OTHER): Payer: Medicare Other | Admitting: Physical Therapy

## 2021-07-20 DIAGNOSIS — M6281 Muscle weakness (generalized): Secondary | ICD-10-CM

## 2021-07-20 DIAGNOSIS — M25562 Pain in left knee: Secondary | ICD-10-CM

## 2021-07-20 DIAGNOSIS — M545 Low back pain, unspecified: Secondary | ICD-10-CM

## 2021-07-20 DIAGNOSIS — M542 Cervicalgia: Secondary | ICD-10-CM

## 2021-07-20 DIAGNOSIS — M25552 Pain in left hip: Secondary | ICD-10-CM

## 2021-07-20 NOTE — Therapy (Signed)
[facility] [address] [place], [place], [address] Phone: [phone]   Fax:  [phone]  Physical Therapy Treatment  Patient Details  Name: Victoria Guzman MRN: [id number] Date of Birth: 22-Sep-1966 Referring Provider (PT): Lavada Mesi, MD   Encounter Date: 07/20/2021   PT End of Session - 07/20/21 1640     Visit Number 4    Number of Visits 16    Date for PT Re-Evaluation 08/03/21    PT Start Time 1617    PT Stop Time 1703    PT Time Calculation (min) 46 min    Equipment Utilized During Treatment Other (comment)   neck float, squoodles, ankle floats, and kickboard   Activity Tolerance Patient tolerated treatment well    Behavior During Therapy [facility] for tasks assessed/performed             Past Medical History:  Diagnosis Date   Anemia    Anxiety    Chronic fatigue    Chronic insomnia 05/31/2016   Depression    Family history of adverse reaction to anesthesia    mother difficult to wake up after CABG   Fibromyalgia    Hypertension    Insomnia    Panic attack    Stroke (HCC)    tia   Vitamin D deficiency 05/31/2016    Past Surgical History:  Procedure Laterality Date   ABDOMINAL HYSTERECTOMY     COLONOSCOPY WITH PROPOFOL N/A 07/24/2018   Procedure: COLONOSCOPY WITH PROPOFOL;  Surgeon: Toledo, Boykin Nearing, MD;  Location: [facility];  Service: Gastroenterology;  Laterality: N/A;   ESOPHAGOGASTRODUODENOSCOPY (EGD) WITH PROPOFOL N/A 07/24/2018   Procedure: ESOPHAGOGASTRODUODENOSCOPY (EGD) WITH PROPOFOL;  Surgeon: Toledo, Boykin Nearing, MD;  Location: [facility];  Service: Gastroenterology;  Laterality: N/A;   LAPAROSCOPIC BILATERAL SALPINGECTOMY Bilateral 01/25/2019   Procedure: LAPAROSCOPIC BILATERAL SALPINGECTOMY;  Surgeon: Schermerhorn, Ihor Austin, MD;  Location: [facility];  Service: Gynecology;  Laterality: Bilateral;   PARATHYROIDECTOMY     PARTIAL HYSTERECTOMY     TRACHELECTOMY N/A 01/25/2019    Procedure: TRACHELECTOMY, laparoscopic cervical;  Surgeon: Schermerhorn, Ihor Austin, MD;  Location: [facility];  Service: Gynecology;  Laterality: N/A;    There were no vitals filed for this visit.   Subjective Assessment - 07/20/21 1630     Subjective Pt states she felt good after prior Rx.  Pt is very limited with standing duration and has increased pain with all standing activities including preparing food and cooking.  Pt states she is able to shower independently though has increased pain.  She is limited and has increased pain with ambulation.  Pt had pain walking into [employer] to pick up some items.  Pt states she is performing her prior land based HEP.    Pertinent History TIA in 2015, Fibromyalgia, depression, Grade I anterolisthesis L4-5    Diagnostic tests Cervical, lumbar, Thoracic, and Hip X rays.  Lumbar MRI:  Grade I anterolisthesis L4-5, moderate central canal stenosis and neuroforaminal narrowing, L3-4 mild DDD, synovial cyst buried in R ant medial facet jt at L4-5.  L hip MRI in April indicated no acute osseous abnormalities, possible L anterosuperior chondral labral fissure, possible capsular sprain and/or obutartor strain, and probable R glute medius calcific tendinosis.    Currently in Pain? Yes    Pain Score --   4-5/10   Pain Location --   central lumbar and bilat lumbar flank   Pain Score 5    Pain Location Knee  Pain Orientation Right;Left    Pain Score 5    Pain Location Neck    Pain Score 5    Pain Location Hip    Pain Orientation Left                [facility] PT Assessment - 07/20/21 0001       Observation/Other Assessments   Other Surveys  Modified Oswestry;Neck Disability Index    Modified Oswestry 62%    Neck Disability Index  26               Pt seen for aquatic therapy today.  Treatment took place in water 3.5-4 ft in depth at the [facility] pool. Temp of water was 93.  Pt entered/exited the pool via stairs independently with  bilat rail.   Reviewed response to prior Rx, HEP compliance, pain level, and current function.   -Pt ambulated 3 laps, sidestepped 2 laps, and ambulated bwd x 2 laps with verbal and visual instruction for reciprocal arm swing.   -Pt performed:     -marching 2 x 10 reps with bilat UE assist on pool edge.     -Standing Hip flex 2x10 reps and standing hip abd 2x10 reps with UE assist on pool edge.      -Kickboard push/pull with TrA contraction 2x10 reps.     -Standing LE noodle pushdowns x 20 rep each with 1 UE assist on pool edge.     -seated bicycles x 20 reps     -Passive floating with ankle floats, neck float,, and 2 squoodles to promote relaxation and improve pain, tightness, and muscle tension.    Pt requires buoyancy for support and to offload joints with strengthening exercises. Viscosity of the water is needed for resistance of strengthening; water current perturbations provides challenge to standing balance unsupported, requiring increased core activation.       PT Education - 07/20/21 1710     Education Details Instructed pt in correct form with exercises.  Instructed pt to cont with her land based HEP.    Person(s) Educated Patient    Methods Explanation;Demonstration;Verbal cues    Comprehension Verbalized understanding;Returned demonstration              PT Short Term Goals - 07/06/21 0841       PT SHORT TERM GOAL #1   Title Pt will tolerate initiation of aquatic therapy without adverse effects for improved mobility and tolerance to activity.    Time 2    Period Weeks    Status New    Target Date 07/19/21      PT SHORT TERM GOAL #2   Title Pt will report at least a 25% improvement in pain/sx's and mobility overall.    Time 3    Period Weeks    Status New    Target Date 07/27/21      PT SHORT TERM GOAL #3   Title Pt will report she is able to stand for longer duration including to wash her dishes and perform laundry without sitting down with reduced pain.     Time 4    Period Weeks    Status New    Target Date 08/02/21      PT SHORT TERM GOAL #4   Title Pt will demo improved cervical AROM to 35 deg in L SB'ing and WFL in R rotation and improved lumbar AROM by at least 25% in flex, SB'ing, and rotation for improved daily mobility.  Time 4    Period Weeks    Status New    Target Date 08/02/21      PT SHORT TERM GOAL #5   Title Pt will demo improved speed and ease with sit/stand transfers and report she is able to perform toilet transfers without difficulty.    Time 4    Period Weeks    Status New    Target Date 08/02/21               PT Long Term Goals - 07/06/21 0954       PT LONG TERM GOAL #1   Title Pt will be independent with aquatic HEP for improved pain, strength, tolerance to activity, and mobility.    Time 8    Period Weeks    Status New    Target Date 08/30/21      PT LONG TERM GOAL #2   Title Pt will report she is able to perform her normal daily transfers without difficulty or significant pain.    Time 8    Period Weeks    Status New    Target Date 08/30/21      PT LONG TERM GOAL #3   Title Pt will be able to ambulate community distance including grocery shopping without significant limitation or pain.    Time 8    Period Weeks    Status New    Target Date 08/30/21      PT LONG TERM GOAL #4   Title Pt will be able to stand to perform her normal household chores without significant limitation.    Time 8    Period Weeks    Status New    Target Date 08/30/21      PT LONG TERM GOAL #5   Title Pt will be able to take of her grandchild with improved ease and less pain.    Time 8    Period Weeks    Status New    Target Date 08/30/21      Additional Long Term Goals   Additional Long Term Goals Yes      PT LONG TERM GOAL #6   Title Pt will demo improved LE strength to 4/5 MMT in L hip and knee and 5/5 in R knee for improved tolerance with and performance of functional mobility.    Time 8    Period  Weeks    Status New    Target Date 08/30/21                   Plan - 07/20/21 1713     Clinical Impression Statement Pt is progressing well with aquatic exercises and gives good effort with all exercises.  She performed aquatic exercises well with cuing and instruction for correct form.  Added LE noodle pushdowns today.  Pt completed NDI and Oswestry today; see assessment flowsheet.  Pt continues to have pain and limitations with ADLs/IADLs and functional mobility skills.   Pt responded well to Rx reporting some throbbing in her knees after Rx.  She denies any significant increase in pain and states she could tell she had a good workout.  Pt should benefit well from skilled PT services/aquatic therapy to improve function, address ongoing goals, and to improve tolerance to activity.    Comorbidities Fibromyalgia, depression    PT Treatment/Interventions ADLs/Self Care Home Management;Aquatic Therapy;Cryotherapy;Electrical Stimulation;Ultrasound;Moist Heat;Gait training;Stair training;Functional mobility training;Therapeutic activities;Therapeutic exercise;Neuromuscular re-education;Balance training;Manual techniques;Patient/family education;Passive range of motion;Dry needling    PT  Next Visit Plan Cont with Aquatic therapy.    PT Home Exercise Plan Pt has an extensive HEP from her prior land based PT.    Consulted and Agree with Plan of Care Patient             Patient will benefit from skilled therapeutic intervention in order to improve the following deficits and impairments:  Decreased activity tolerance, Decreased mobility, Decreased endurance, Decreased range of motion, Decreased strength, Difficulty walking, Increased fascial restricitons, Postural dysfunction, Pain  Visit Diagnosis: Low back pain, unspecified back pain laterality, unspecified chronicity, unspecified whether sciatica present  Cervicalgia  Muscle weakness (generalized)  Pain in left hip  Left knee pain,  unspecified chronicity     Problem List Patient Active Problem List   Diagnosis Date Noted   Post-operative state 01/25/2019   ARF (acute renal failure) (HCC) 04/24/2018   Acute renal failure (ARF) (HCC) 04/22/2018   Hypercalcemia 05/31/2016   Hypokalemia 05/31/2016   Hypochloremia 05/31/2016   Screening for HIV (human immunodeficiency virus) 05/31/2016   Screen for STD (sexually transmitted disease) 05/31/2016   Vitamin D deficiency 05/31/2016   Chronic insomnia 05/31/2016   Absolute anemia 07/07/2015   Clinical depression 07/07/2015   Essential hypertension 07/07/2015   Acute anxiety 07/07/2015   Primary fibromyalgia syndrome 07/07/2015   Embolic stroke (HCC) 07/17/2014    Audie Clear III PT, DPT 07/20/21 5:24 PM   [facility] [address] [place], [place], [address] Phone: [phone]   Fax:  [phone]  Name: Victoria Guzman MRN: [id number] Date of Birth: 1966-04-08

## 2021-07-22 ENCOUNTER — Other Ambulatory Visit: Payer: Self-pay

## 2021-07-22 ENCOUNTER — Encounter (HOSPITAL_BASED_OUTPATIENT_CLINIC_OR_DEPARTMENT_OTHER): Payer: Self-pay | Admitting: Physical Therapy

## 2021-07-22 ENCOUNTER — Ambulatory Visit (HOSPITAL_BASED_OUTPATIENT_CLINIC_OR_DEPARTMENT_OTHER): Payer: Medicare Other | Admitting: Physical Therapy

## 2021-07-22 DIAGNOSIS — M6281 Muscle weakness (generalized): Secondary | ICD-10-CM

## 2021-07-22 DIAGNOSIS — M542 Cervicalgia: Secondary | ICD-10-CM

## 2021-07-22 DIAGNOSIS — M25552 Pain in left hip: Secondary | ICD-10-CM

## 2021-07-22 DIAGNOSIS — M25562 Pain in left knee: Secondary | ICD-10-CM

## 2021-07-22 DIAGNOSIS — M545 Low back pain, unspecified: Secondary | ICD-10-CM

## 2021-07-22 NOTE — Therapy (Signed)
[facility] [address] [place], [place], [address] Phone: [phone]   Fax:  [phone]  Physical Therapy Treatment  Patient Details  Name: Victoria BusmanMelissa L Nierenberg MRN: [id number] Date of Birth: 04-Jun-1966 Referring Provider (PT): Lavada MesiMichael Hilts, MD   Encounter Date: 07/22/2021   PT End of Session - 07/22/21 1721     Visit Number 5    Number of Visits 16    Date for PT Re-Evaluation 08/03/21    PT Start Time 1601    PT Stop Time 1646    PT Time Calculation (min) 45 min    Equipment Utilized During Treatment Other (comment)   neck float, squoodles, ankle floats, and kickboard   Activity Tolerance Patient tolerated treatment well    Behavior [facility] for tasks assessed/performed             Past Medical History:  Diagnosis Date   Anemia    Anxiety    Chronic fatigue    Chronic insomnia 05/31/2016   Depression    Family history of adverse reaction to anesthesia    mother difficult to wake up after CABG   Fibromyalgia    Hypertension    Insomnia    Panic attack    Stroke (HCC)    tia   Vitamin D deficiency 05/31/2016    Past Surgical History:  Procedure Laterality Date   ABDOMINAL HYSTERECTOMY     COLONOSCOPY WITH PROPOFOL N/A 07/24/2018   Procedure: COLONOSCOPY WITH PROPOFOL;  Surgeon: Toledo, Boykin Nearingeodoro K, MD;  Location: [facility];  Service: Gastroenterology;  Laterality: N/A;   ESOPHAGOGASTRODUODENOSCOPY (EGD) WITH PROPOFOL N/A 07/24/2018   Procedure: ESOPHAGOGASTRODUODENOSCOPY (EGD) WITH PROPOFOL;  Surgeon: Toledo, Boykin Nearingeodoro K, MD;  Location: [facility];  Service: Gastroenterology;  Laterality: N/A;   LAPAROSCOPIC BILATERAL SALPINGECTOMY Bilateral 01/25/2019   Procedure: LAPAROSCOPIC BILATERAL SALPINGECTOMY;  Surgeon: Schermerhorn, Ihor Austinhomas J, MD;  Location: [facility];  Service: Gynecology;  Laterality: Bilateral;   PARATHYROIDECTOMY     PARTIAL HYSTERECTOMY     TRACHELECTOMY N/A 01/25/2019    Procedure: TRACHELECTOMY, laparoscopic cervical;  Surgeon: Schermerhorn, Ihor Austinhomas J, MD;  Location: [facility];  Service: Gynecology;  Laterality: N/A;    There were no vitals filed for this visit.   Subjective Assessment - 07/22/21 1603     Subjective Pt reports increased bilat knee pain and lumbar pain and stiffness after prior Rx.  Pt was able to stand to make a tuna fish salad though did have pain.  She is very limited with standing duration and has increased pain with all standing activities.  Pt states she is able to shower independently though has increased pain.  She is limited and has increased pain with ambulation.  Pt states she is performing her prior land based HEP.  Pt reports improved tolerance to activity including being able to sit for longer duration in her chair at home instead of lying down as much.  Pt also states she walked her cul-de-sac twice yesterday.    Pertinent History TIA in 2015, Fibromyalgia, depression, Grade I anterolisthesis L4-5    Currently in Pain? Yes    Pain Score 5     Pain Location --   lumbar   Pain Score 5    Pain Location Knee    Pain Orientation Left;Right    Pain Score 4    Pain Location Neck    Pain Score 3    Pain Location Hip    Pain Orientation Left  Pt seen for aquatic therapy today.  Treatment took place in water 3.5-4 ft in depth at the [facility] pool. Temp of water was 92.  Pt entered/exited the pool via stairs independently with bilat rail.   Reviewed response to prior Rx, HEP compliance, pain level, and current function.   -Pt ambulated 4 laps, sidestepped 3 laps with shoulder abd/add, and ambulated bwd x 3 laps with verbal and visual instruction for reciprocal arm swing.   -Pt performed:     -marching 2 x 10 reps with yellow noodle.     -Standing Hip flex 2x10 reps and standing hip abd 2x10 reps with UE assist on pool edge.      -Kickboard push/pull with TrA contraction 2x10 reps.     -Standing LE  noodle pushdowns x 20 rep each with 1 UE assist on pool edge.     -seated bicycles 2 x 20 reps     -Passive floating with ankle floats, neck float,, and 2 squoodles to promote relaxation and improve pain, tightness, and muscle tension.    Pt requires buoyancy for support and to offload joints with strengthening exercises. Viscosity of the water is needed for resistance of strengthening; water current perturbations provides challenge to standing balance unsupported, requiring increased core activation.         PT Education - 07/22/21 1720     Education Details Educated pt concerning POC and instructed pt to cont to progress land based functional activity including increasing walking distance and performing walking consistenly.  Instructed pt in correct form with exercises.    Person(s) Educated Patient    Methods Explanation;Demonstration;Verbal cues    Comprehension Returned demonstration;Verbalized understanding              PT Short Term Goals - 07/06/21 0841       PT SHORT TERM GOAL #1   Title Pt will tolerate initiation of aquatic therapy without adverse effects for improved mobility and tolerance to activity.    Time 2    Period Weeks    Status New    Target Date 07/19/21      PT SHORT TERM GOAL #2   Title Pt will report at least a 25% improvement in pain/sx's and mobility overall.    Time 3    Period Weeks    Status New    Target Date 07/27/21      PT SHORT TERM GOAL #3   Title Pt will report she is able to stand for longer duration including to wash her dishes and perform laundry without sitting down with reduced pain.    Time 4    Period Weeks    Status New    Target Date 08/02/21      PT SHORT TERM GOAL #4   Title Pt will demo improved cervical AROM to 35 deg in L SB'ing and WFL in R rotation and improved lumbar AROM by at least 25% in flex, SB'ing, and rotation for improved daily mobility.    Time 4    Period Weeks    Status New    Target Date 08/02/21       PT SHORT TERM GOAL #5   Title Pt will demo improved speed and ease with sit/stand transfers and report she is able to perform toilet transfers without difficulty.    Time 4    Period Weeks    Status New    Target Date 08/02/21  PT Long Term Goals - 07/06/21 0954       PT LONG TERM GOAL #1   Title Pt will be independent with aquatic HEP for improved pain, strength, tolerance to activity, and mobility.    Time 8    Period Weeks    Status New    Target Date 08/30/21      PT LONG TERM GOAL #2   Title Pt will report she is able to perform her normal daily transfers without difficulty or significant pain.    Time 8    Period Weeks    Status New    Target Date 08/30/21      PT LONG TERM GOAL #3   Title Pt will be able to ambulate community distance including grocery shopping without significant limitation or pain.    Time 8    Period Weeks    Status New    Target Date 08/30/21      PT LONG TERM GOAL #4   Title Pt will be able to stand to perform her normal household chores without significant limitation.    Time 8    Period Weeks    Status New    Target Date 08/30/21      PT LONG TERM GOAL #5   Title Pt will be able to take of her grandchild with improved ease and less pain.    Time 8    Period Weeks    Status New    Target Date 08/30/21      Additional Long Term Goals   Additional Long Term Goals Yes      PT LONG TERM GOAL #6   Title Pt will demo improved LE strength to 4/5 MMT in L hip and knee and 5/5 in R knee for improved tolerance with and performance of functional mobility.    Time 8    Period Weeks    Status New    Target Date 08/30/21                   Plan - 07/22/21 1716     Clinical Impression Statement Pt is progressing well with aquatic exercises and gives good effort with all exercises. She performed aquatic exercises well with cuing and instruction for correct form.  Pt states this is the most activity she has done  since she was in the [facility] in November.  Pt is making some progress with tolerance to activity as evidenced by subjective reports.  She states she is not lying down as much and is able to sit in her chair longer at home.  She was able to stand to make her a tuna fish salad and and able to walk her cul-de-sac twice.  Pt responded well to Rx reportning no increased pain after Rx just felt "heavy".  Pt should benefit from cont skilled skilled PT services/aquatic therapy to address ongoing goals, improve functional tolerance to activity, and to assist in maximizing functional mobility.    Comorbidities Fibromyalgia, depression    PT Treatment/Interventions ADLs/Self Care Home Management;Aquatic Therapy;Cryotherapy;Electrical Stimulation;Ultrasound;Moist Heat;Gait training;Stair training;Functional mobility training;Therapeutic activities;Therapeutic exercise;Neuromuscular re-education;Balance training;Manual techniques;Patient/family education;Passive range of motion;Dry needling    PT Next Visit Plan Cont with Aquatic therapy.    PT Home Exercise Plan Pt has an extensive HEP from her prior land based PT.    Consulted and Agree with Plan of Care Patient             Patient will benefit from skilled therapeutic intervention  in order to improve the following deficits and impairments:  Decreased activity tolerance, Decreased mobility, Decreased endurance, Decreased range of motion, Decreased strength, Difficulty walking, Increased fascial restricitons, Postural dysfunction, Pain  Visit Diagnosis: Low back pain, unspecified back pain laterality, unspecified chronicity, unspecified whether sciatica present  Cervicalgia  Muscle weakness (generalized)  Pain in left hip  Left knee pain, unspecified chronicity     Problem List Patient Active Problem List   Diagnosis Date Noted   Post-operative state 01/25/2019   ARF (acute renal failure) (HCC) 04/24/2018   Acute renal failure (ARF) (HCC)  04/22/2018   Hypercalcemia 05/31/2016   Hypokalemia 05/31/2016   Hypochloremia 05/31/2016   Screening for HIV (human immunodeficiency virus) 05/31/2016   Screen for STD (sexually transmitted disease) 05/31/2016   Vitamin D deficiency 05/31/2016   Chronic insomnia 05/31/2016   Absolute anemia 07/07/2015   Clinical depression 07/07/2015   Essential hypertension 07/07/2015   Acute anxiety 07/07/2015   Primary fibromyalgia syndrome 07/07/2015   Embolic stroke (HCC) 07/17/2014    Audie Clear III PT, DPT 07/22/21 5:25 PM   [facility] [address] [place], [place], [address] Phone: [phone]   Fax:  [phone]  Name: BREANNAH KRATT MRN: [id number] Date of Birth: 10-01-1966

## 2021-07-27 ENCOUNTER — Ambulatory Visit (HOSPITAL_BASED_OUTPATIENT_CLINIC_OR_DEPARTMENT_OTHER): Payer: Medicare Other | Admitting: Physical Therapy

## 2021-07-27 ENCOUNTER — Encounter (HOSPITAL_BASED_OUTPATIENT_CLINIC_OR_DEPARTMENT_OTHER): Payer: Self-pay | Admitting: Physical Therapy

## 2021-07-27 ENCOUNTER — Other Ambulatory Visit: Payer: Self-pay

## 2021-07-27 DIAGNOSIS — M545 Low back pain, unspecified: Secondary | ICD-10-CM

## 2021-07-27 DIAGNOSIS — M25562 Pain in left knee: Secondary | ICD-10-CM

## 2021-07-27 DIAGNOSIS — M6281 Muscle weakness (generalized): Secondary | ICD-10-CM

## 2021-07-27 DIAGNOSIS — M25552 Pain in left hip: Secondary | ICD-10-CM

## 2021-07-27 DIAGNOSIS — M542 Cervicalgia: Secondary | ICD-10-CM

## 2021-07-28 NOTE — Therapy (Signed)
[facility] [address] [place], [place], [address] Phone: [phone]   Fax:  [phone]  Physical Therapy Treatment  Patient Details  Name: Victoria Guzman MRN: [id number] Date of Birth: [birth year] Referring Provider (PT): Lavada MesiMichael Hilts, MD   Encounter Date: 07/27/2021   PT End of Session - 07/27/21 1641     Visit Number 6    Number of Visits 16    Date for PT Re-Evaluation 08/03/21    PT Start Time 1615    PT Stop Time 1700    PT Time Calculation (min) 45 min    Equipment Utilized During Treatment Other (comment)   neck float, squoodles, ankle floats, and kickboard   Activity Tolerance Patient tolerated treatment well    [facility] tasks assessed/performed             Past Medical History:  Diagnosis Date   Anemia    Anxiety    Chronic fatigue    Chronic insomnia 05/31/2016   Depression    Family history of adverse reaction to anesthesia    mother difficult to wake up after CABG   Fibromyalgia    Hypertension    Insomnia    Panic attack    Stroke (HCC)    tia   Vitamin D deficiency 05/31/2016    Past Surgical History:  Procedure Laterality Date   ABDOMINAL HYSTERECTOMY     COLONOSCOPY WITH PROPOFOL N/A 07/24/2018   Procedure: COLONOSCOPY WITH PROPOFOL;  Surgeon: Toledo, Boykin Nearingeodoro K, MD;  Location: [facility];  Service: Gastroenterology;  Laterality: N/A;   ESOPHAGOGASTRODUODENOSCOPY (EGD) WITH PROPOFOL N/A 07/24/2018   Procedure: ESOPHAGOGASTRODUODENOSCOPY (EGD) WITH PROPOFOL;  Surgeon: Toledo, Boykin Nearingeodoro K, MD;  Location: [facility];  Service: Gastroenterology;  Laterality: N/A;   LAPAROSCOPIC BILATERAL SALPINGECTOMY Bilateral 01/25/2019   Procedure: LAPAROSCOPIC BILATERAL SALPINGECTOMY;  Surgeon: Schermerhorn, Ihor Austinhomas J, MD;  Location: [facility] ORS;  Service: Gynecology;  Laterality: Bilateral;   PARATHYROIDECTOMY     PARTIAL HYSTERECTOMY     TRACHELECTOMY N/A 01/25/2019    Procedure: TRACHELECTOMY, laparoscopic cervical;  Surgeon: Schermerhorn, Ihor Austinhomas J, MD;  Location: [facility] ORS;  Service: Gynecology;  Laterality: N/A;    There were no vitals filed for this visit.   Subjective Assessment - 07/27/21 1637     Subjective Pt states she felt fine after prior Rx though did have increased pain the following day.  Pt states she had pain over the weekend whih did bother her sitting.  She woke up this AM with her neck bothering her.  Pt was able to stand long enough to wash and season chicken and put it in the oven which is a large improvement for her.  Pt states she feels like she is doing more than she has in a long time.  Pt reports she was able to sit outside which she hasn't done in a long time.  Pt is sitting more and trying to stay out of her bed.    Limitations Sitting;Walking;Standing;Lifting;House hold activities    Diagnostic tests Cervical, lumbar, Thoracic, and Hip X rays.  Lumbar MRI:  Grade I anterolisthesis L4-5, moderate central canal stenosis and neuroforaminal narrowing, L3-4 mild DDD, synovial cyst buried in R ant medial facet jt at L4-5.  L hip MRI in April indicated no acute osseous abnormalities, possible L anterosuperior chondral labral fissure, possible capsular sprain and/or obutartor strain, and probable R glute medius calcific tendinosis.    Currently in Pain? Yes    Pain Score  4     Pain Location Neck    Pain Score 5    Pain Location Knee    Pain Orientation Left;Right    Pain Score 4    Pain Location Back             Pt seen for aquatic therapy today.  Treatment took place in water 3.5-4.5 ft in depth at the [facility] pool.  Pt entered/exited the pool via stairs independently with bilat rail.   Reviewed response to prior Rx, HEP compliance, pain level, and current function.   -Pt ambulated 4 laps, sidestepped 3 laps with shoulder abd/add, and ambulated bwd x 3 laps with verbal and visual instruction for reciprocal arm swing.    -Pt performed:     -marching 2 x 10 reps with yellow noodle.     -Standing Hip flex 2x10 reps and standing hip abd 2x10 reps with UE assist on pool edge.      -Kickboard push/pull with TrA contraction 2x10 reps.     -Standing LE noodle pushdowns x 20 rep each with 1 UE assist on pool edge.     -seated bicycles 2 x 20 reps     -standing HS stretch at step 2x20-30 seconds bilat     -Passive floating with ankle floats, neck float, and 2 squoodles to promote relaxation and improve pain, tightness, and muscle tension.    Pt requires buoyancy for support and to offload joints with strengthening exercises. Viscosity of the water is needed for resistance of strengthening; water current perturbations provides challenge to standing balance unsupported, requiring increased core activation      PT Education - 07/28/21 0940     Education Details Instructed pt in correct form with exercises.    Person(s) Educated Patient    Methods Explanation;Demonstration;Verbal cues    Comprehension Returned demonstration;Verbalized understanding              PT Short Term Goals - 07/06/21 0841       PT SHORT TERM GOAL #1   Title Pt will tolerate initiation of aquatic therapy without adverse effects for improved mobility and tolerance to activity.    Time 2    Period Weeks    Status New    Target Date 07/19/21      PT SHORT TERM GOAL #2   Title Pt will report at least a 25% improvement in pain/sx's and mobility overall.    Time 3    Period Weeks    Status New    Target Date 07/27/21      PT SHORT TERM GOAL #3   Title Pt will report she is able to stand for longer duration including to wash her dishes and perform laundry without sitting down with reduced pain.    Time 4    Period Weeks    Status New    Target Date 08/02/21      PT SHORT TERM GOAL #4   Title Pt will demo improved cervical AROM to 35 deg in L SB'ing and WFL in R rotation and improved lumbar AROM by at least 25% in flex,  SB'ing, and rotation for improved daily mobility.    Time 4    Period Weeks    Status New    Target Date 08/02/21      PT SHORT TERM GOAL #5   Title Pt will demo improved speed and ease with sit/stand transfers and report she is able to perform toilet transfers without difficulty.  Time 4    Period Weeks    Status New    Target Date 08/02/21               PT Long Term Goals - 07/06/21 0954       PT LONG TERM GOAL #1   Title Pt will be independent with aquatic HEP for improved pain, strength, tolerance to activity, and mobility.    Time 8    Period Weeks    Status New    Target Date 08/30/21      PT LONG TERM GOAL #2   Title Pt will report she is able to perform her normal daily transfers without difficulty or significant pain.    Time 8    Period Weeks    Status New    Target Date 08/30/21      PT LONG TERM GOAL #3   Title Pt will be able to ambulate community distance including grocery shopping without significant limitation or pain.    Time 8    Period Weeks    Status New    Target Date 08/30/21      PT LONG TERM GOAL #4   Title Pt will be able to stand to perform her normal household chores without significant limitation.    Time 8    Period Weeks    Status New    Target Date 08/30/21      PT LONG TERM GOAL #5   Title Pt will be able to take of her grandchild with improved ease and less pain.    Time 8    Period Weeks    Status New    Target Date 08/30/21      Additional Long Term Goals   Additional Long Term Goals Yes      PT LONG TERM GOAL #6   Title Pt will demo improved LE strength to 4/5 MMT in L hip and knee and 5/5 in R knee for improved tolerance with and performance of functional mobility.    Time 8    Period Weeks    Status New    Target Date 08/30/21                   Plan - 07/28/21 0014     Clinical Impression Statement Pt is progressing well with aquatic exercises and gives good effort with all exercises. She  performed aquatic exercises well with cuing and instruction for correct form. Pt is making some progress with tolerance to activity as evidenced by subjective reports.  She states she is not lying down as much and is able to sit in her chair longer at home and was able to sit outside.  She also reports being able to stand to prepare food.  Pt responded well to Rx having no increased pain after Rx.  She should continue to benefit from cont skilled PT services to address ongoing goals and maximize functional mobility.    Comorbidities Fibromyalgia, depression    PT Treatment/Interventions ADLs/Self Care Home Management;Aquatic Therapy;Cryotherapy;Electrical Stimulation;Ultrasound;Moist Heat;Gait training;Stair training;Functional mobility training;Therapeutic activities;Therapeutic exercise;Neuromuscular re-education;Balance training;Manual techniques;Patient/family education;Passive range of motion;Dry needling    PT Next Visit Plan Cont with Aquatic therapy.    PT Home Exercise Plan Pt has an extensive HEP from her prior land based PT.    Consulted and Agree with Plan of Care Patient             Patient will benefit from skilled therapeutic intervention in order to  improve the following deficits and impairments:  Decreased activity tolerance, Decreased mobility, Decreased endurance, Decreased range of motion, Decreased strength, Difficulty walking, Increased fascial restricitons, Postural dysfunction, Pain  Visit Diagnosis: Low back pain, unspecified back pain laterality, unspecified chronicity, unspecified whether sciatica present  Cervicalgia  Muscle weakness (generalized)  Pain in left hip  Left knee pain, unspecified chronicity     Problem List Patient Active Problem List   Diagnosis Date Noted   Post-operative state 01/25/2019   ARF (acute renal failure) (HCC) 04/24/2018   Acute renal failure (ARF) (HCC) 04/22/2018   Hypercalcemia 05/31/2016   Hypokalemia 05/31/2016    Hypochloremia 05/31/2016   Screening for HIV (human immunodeficiency virus) 05/31/2016   Screen for STD (sexually transmitted disease) 05/31/2016   Vitamin D deficiency 05/31/2016   Chronic insomnia 05/31/2016   Absolute anemia 07/07/2015   Clinical depression 07/07/2015   Essential hypertension 07/07/2015   Acute anxiety 07/07/2015   Primary fibromyalgia syndrome 07/07/2015   Embolic stroke (HCC) 07/17/2014    Audie Clear III PT, DPT 07/28/21 9:46 AM   [facility] [address] [place], [place], [address] Phone: [phone]   Fax:  [phone]  Name: Victoria Guzman MRN: [id number] Date of Birth: 1966/10/10

## 2021-07-29 ENCOUNTER — Ambulatory Visit (HOSPITAL_BASED_OUTPATIENT_CLINIC_OR_DEPARTMENT_OTHER): Payer: Medicare Other | Admitting: Physical Therapy

## 2021-08-02 ENCOUNTER — Ambulatory Visit (HOSPITAL_BASED_OUTPATIENT_CLINIC_OR_DEPARTMENT_OTHER): Payer: Self-pay | Admitting: Physical Therapy

## 2021-08-05 ENCOUNTER — Ambulatory Visit (HOSPITAL_BASED_OUTPATIENT_CLINIC_OR_DEPARTMENT_OTHER): Payer: Medicare Other | Attending: Family Medicine | Admitting: Physical Therapy

## 2021-08-05 ENCOUNTER — Other Ambulatory Visit: Payer: Self-pay

## 2021-08-05 ENCOUNTER — Encounter (HOSPITAL_BASED_OUTPATIENT_CLINIC_OR_DEPARTMENT_OTHER): Payer: Self-pay | Admitting: Physical Therapy

## 2021-08-05 DIAGNOSIS — M25562 Pain in left knee: Secondary | ICD-10-CM

## 2021-08-05 DIAGNOSIS — M545 Low back pain, unspecified: Secondary | ICD-10-CM

## 2021-08-05 DIAGNOSIS — M542 Cervicalgia: Secondary | ICD-10-CM | POA: Insufficient documentation

## 2021-08-05 DIAGNOSIS — M6281 Muscle weakness (generalized): Secondary | ICD-10-CM | POA: Diagnosis present

## 2021-08-05 DIAGNOSIS — M25552 Pain in left hip: Secondary | ICD-10-CM | POA: Diagnosis present

## 2021-08-05 NOTE — Therapy (Signed)
[place] [address] [place], [place], [address] Phone: [phone]   Fax:  [phone]  Physical Therapy Treatment/Progress Note  Patient Details  Name: Victoria Guzman MRN: [id number] Date of Birth: 07-Jun-1966 Referring Provider (PT): Eunice Blase, MD   Encounter Date: 08/05/2021   PT End of Session - 08/05/21 1709     Visit Number 7    Number of Visits 16    Date for PT Re-Evaluation 08/30/21    PT Start Time 8416    PT Stop Time 1703    PT Time Calculation (min) 48 min    Activity Tolerance Patient tolerated treatment well    Behavior During Therapy [facility] for tasks assessed/performed             Past Medical History:  Diagnosis Date   Anemia    Anxiety    Chronic fatigue    Chronic insomnia 05/31/2016   Depression    Family history of adverse reaction to anesthesia    mother difficult to wake up after CABG   Fibromyalgia    Hypertension    Insomnia    Panic attack    Stroke [facility])    tia   Vitamin D deficiency 05/31/2016    Past Surgical History:  Procedure Laterality Date   ABDOMINAL HYSTERECTOMY     COLONOSCOPY WITH PROPOFOL N/A 07/24/2018   Procedure: COLONOSCOPY WITH PROPOFOL;  Surgeon: Toledo, Benay Pike, MD;  Location: [facility];  Service: Gastroenterology;  Laterality: N/A;   ESOPHAGOGASTRODUODENOSCOPY (EGD) WITH PROPOFOL N/A 07/24/2018   Procedure: ESOPHAGOGASTRODUODENOSCOPY (EGD) WITH PROPOFOL;  Surgeon: Toledo, Benay Pike, MD;  Location: [facility];  Service: Gastroenterology;  Laterality: N/A;   LAPAROSCOPIC BILATERAL SALPINGECTOMY Bilateral 01/25/2019   Procedure: LAPAROSCOPIC BILATERAL SALPINGECTOMY;  Surgeon: Schermerhorn, Gwen Her, MD;  Location: [facility] ORS;  Service: Gynecology;  Laterality: Bilateral;   PARATHYROIDECTOMY     PARTIAL HYSTERECTOMY     TRACHELECTOMY N/A 01/25/2019   Procedure: TRACHELECTOMY, laparoscopic cervical;  Surgeon: Schermerhorn, Gwen Her, MD;  Location: [facility]  ORS;  Service: Gynecology;  Laterality: N/A;    There were no vitals filed for this visit.   Subjective Assessment - 08/05/21 1630     Subjective "I can tell the water therapy is helping.  I think this water therapy is really helping me".  Pt does have some soreness after Rx though generally feels better after aquatic treatments.  Pt states she had soreness after prior Rx.  Pt states she is able to ambulate increased distance and is walking more in the grocery store.  Pt is able to sit for at least 45 mins - 1 hr and has been trying to get OOB more and sit for longer duration.  Pt walked twice in her cul-de-sac.  Pt states she is able to stand for longer duration to prepare her food including washing and seasoning chicken and putting it in the oven.  Pt states she can't stand long to prepare food though is better.  Pt able to stand and brush her teeth with reduced pain.  Pt reports 20-25% improvement in pain, sx's, and mobility overall.  Pt states she was crying a lot because she was depressed, but not she is not not crying as much.  pt feels hope becauses she sees improvement.    Pertinent History TIA in 2015, Fibromyalgia, depression, Grade I anterolisthesis L4-5    How long can you sit comfortably? Very limited, always painful though has improved.    How long  can you stand comfortably? unable to stand comfortably.  very limited though has improved    How long can you walk comfortably? Significantly limited and painful    Diagnostic tests Cervical, lumbar, Thoracic, and Hip X rays.  Lumbar MRI:  Grade I anterolisthesis L4-5, moderate central canal stenosis and neuroforaminal narrowing, L3-4 mild DDD, synovial cyst buried in R ant medial facet jt at L4-5.  L hip MRI in April indicated no acute osseous abnormalities, possible L anterosuperior chondral labral fissure, possible capsular sprain and/or obutartor strain, and probable R glute medius calcific tendinosis.    Currently in Pain? Yes    Pain  Score 5     Pain Location Knee    Pain Score 3    Pain Location Knee    Pain Score 5    Pain Location Back    Pain Score 4    Pain Location Hip    Pain Orientation Left                [facility] PT Assessment - 08/05/21 0001       Observation/Other Assessments   Modified Oswestry 56   1-3; 2-2; 3-4; 4-3; 5-2; 6-3; 7-2 8-3; 9-3; 10-3.   Neck Disability Index  25   1-3; 2-2; 3-4; 4-3; 5-0; 6-1; 7-4; 8-2; 9,10-3.     AROM   Right Shoulder Flexion --   [facility] without back pain   Right Shoulder ABduction --   [facility] without back pain   Left Shoulder Flexion --   WFL with back pain   Left Shoulder ABduction --   WFL with back pain   Cervical Flexion WFL   with pain   Cervical Extension 59   with pain   Cervical - Right Side Bend 33   with pain   Cervical - Left Side Bend 26   with pain   Cervical - Right Rotation WFL   with pain   Cervical - Left Rotation 75%   with pain   Lumbar Flexion 60% with pain    Lumbar - Right Side Bend WFL with pain    Lumbar - Left Side Bend 60% with pain    Lumbar - Right Rotation 60% with pain    Lumbar - Left Rotation 40% with pain      Strength   Right Hip Flexion 4+/5    Right Hip Extension 4/5    Right Hip External Rotation  5/5    Right Hip ABduction 5/5    Left Hip Flexion 4/5    Left Hip External Rotation 4/5    Left Hip ABduction 4/5    Right Knee Flexion 5/5    Right Knee Extension 4/5    Left Knee Flexion 4+/5    Left Knee Extension 4-/5      Palpation   Palpation comment TTP:  L UT and cervical paraspinals, slightly in R.  bilat lumbar flanks and SP's of lumbar.  Pt has soft tissue tightness in bilat lumbar paraspinals.      Ambulation/Gait   Gait Comments reduced guarding and improved speed gait. improved pelvic rotatoin.  limited foot clearance bilat.   pain with ambulating 20 ft in the clinic                                  PT Education - 08/05/21 2130     Education Details Educated pt in objective  findings and in  comparison to her initial/prior findings.  Educated pt in goal progress and POC.    Person(s) Educated Patient    Methods Explanation    Comprehension Verbalized understanding              PT Short Term Goals - 08/05/21 1639       PT SHORT TERM GOAL #1   Title Pt will tolerate initiation of aquatic therapy without adverse effects for improved mobility and tolerance to activity.    Time 2    Period Weeks    Status Achieved    Target Date 07/19/21      PT SHORT TERM GOAL #2   Title Pt will report at least a 25% improvement in pain/sx's and mobility overall.    Time 3    Period Weeks    Status Partially Met    Target Date 07/27/21      PT SHORT TERM GOAL #3   Title Pt will report she is able to stand for longer duration including to wash her dishes and perform laundry without sitting down with reduced pain.    Time 4    Period Weeks    Status On-going    Target Date 08/02/21      PT SHORT TERM GOAL #4   Title Pt will demo improved cervical AROM to 35 deg in L SB'ing and WFL in R rotation and improved lumbar AROM by at least 25% in flex, SB'ing, and rotation for improved daily mobility.    Time 4    Period Weeks    Status Partially Met    Target Date 08/02/21      PT SHORT TERM GOAL #5   Title Pt will demo improved speed and ease with sit/stand transfers and report she is able to perform toilet transfers without difficulty.    Time 4    Period Weeks    Status On-going               PT Long Term Goals - 08/05/21 1705       PT LONG TERM GOAL #1   Title Pt will be independent with aquatic HEP for improved pain, strength, tolerance to activity, and mobility.    Time 8    Period Weeks    Status On-going    Target Date 08/30/21      PT LONG TERM GOAL #2   Title Pt will report she is able to perform her normal daily transfers without difficulty or significant pain.    Time 8    Period Weeks    Status Not Met    Target Date 08/30/21      PT  LONG TERM GOAL #3   Title Pt will be able to ambulate community distance including grocery shopping without significant limitation or pain.    Time 8    Period Weeks    Status On-going    Target Date 08/30/21      PT LONG TERM GOAL #4   Title Pt will be able to stand to perform her normal household chores without significant limitation.    Time 8    Period Weeks    Status On-going      PT LONG TERM GOAL #5   Title Pt will be able to take of her grandchild with improved ease and less pain.    Time 8    Period Weeks    Status On-going    Target Date 08/30/21      PT  LONG TERM GOAL #6   Title Pt will demo improved LE strength to 4/5 MMT in L hip and knee and 5/5 in R knee for improved tolerance with and performance of functional mobility.    Time 8    Period Weeks    Status Partially Met    Target Date 08/30/21                   Plan - 08/05/21 1646     Clinical Impression Statement Pt enjoys aquatic therapy and reports significant improvement since beginning aquatic therapy.  Pt reports she ambulating increased distance including walking more in the grocery store.  Pt has been OOB more and is trying to sit more at home.  She has improved sitting tolerance.  Pt walked twice outside in her cul-de-sac.  Pt states she is able to stand for longer duration to prepare her food, but is unable to stand for long periods to cook/prepare food.  Pt reports 20-25% improvement in pain, sx's, and mobility overall.  Though she is improving, she has continued significant deficits in ADLs/IADLs and functional mobility.  She has constant pain and is limited with standing duration and performing household chores.  Pt demonstrates improved cervical extension and worse R SB AROM.  She demonstrates improved lumbar AROM in flexion, R SB, and R rot.  Pt has improved in strength with L hip flexion and knee extension, bilat hip ER and extension, and bilat knee flexion.  Pt demonstrate improved self  perceived disability by  6% with Oswestry and 1 pt in the NDI though not clinically significant improvement.  Pt gives good effort with all aquatic exercises and is progressing well in the pool. Pt is progressing slowly toward goals.  She should benefit from cont skilled PT services to address ongoing goals, improve tolerance to activity, and maximize functional mobility.    Comorbidities Fibromyalgia, depression    Rehab Potential Good   for goals   PT Frequency 2x / week    PT Duration 4 weeks    PT Treatment/Interventions ADLs/Self Care Home Management;Aquatic Therapy;Cryotherapy;Electrical Stimulation;Ultrasound;Moist Heat;Gait training;Stair training;Functional mobility training;Therapeutic activities;Therapeutic exercise;Neuromuscular re-education;Balance training;Manual techniques;Patient/family education;Passive range of motion;Dry needling    PT Next Visit Plan Cont with Aquatic therapy.             Patient will benefit from skilled therapeutic intervention in order to improve the following deficits and impairments:  Decreased activity tolerance, Decreased mobility, Decreased endurance, Decreased range of motion, Decreased strength, Difficulty walking, Increased fascial restricitons, Postural dysfunction, Pain  Visit Diagnosis: Low back pain, unspecified back pain laterality, unspecified chronicity, unspecified whether sciatica present  Cervicalgia  Muscle weakness (generalized)  Pain in left hip  Left knee pain, unspecified chronicity     Problem List Patient Active Problem List   Diagnosis Date Noted   Post-operative state 01/25/2019   ARF (acute renal failure) ([place]) 04/24/2018   Acute renal failure (ARF) ([place]) 04/22/2018   Hypercalcemia 05/31/2016   Hypokalemia 05/31/2016   Hypochloremia 05/31/2016   Screening for HIV (human immunodeficiency virus) 05/31/2016   Screen for STD (sexually transmitted disease) 05/31/2016   Vitamin D deficiency 05/31/2016   Chronic  insomnia 05/31/2016   Absolute anemia 07/07/2015   Clinical depression 07/07/2015   Essential hypertension 07/07/2015   Acute anxiety 07/07/2015   Primary fibromyalgia syndrome 56/25/6389   Embolic stroke ([place]) 37/34/2876    Selinda Michaels III PT, DPT 08/05/21 9:50 PM   [facility] [phone]  [place], [place], [address] Phone: [phone]   Fax:  [phone]  Name: Victoria Guzman MRN: [id number] Date of Birth: 09/04/1966

## 2021-08-12 ENCOUNTER — Encounter (HOSPITAL_BASED_OUTPATIENT_CLINIC_OR_DEPARTMENT_OTHER): Payer: Self-pay | Admitting: Physical Therapy

## 2021-08-12 ENCOUNTER — Other Ambulatory Visit: Payer: Self-pay

## 2021-08-12 ENCOUNTER — Ambulatory Visit (HOSPITAL_BASED_OUTPATIENT_CLINIC_OR_DEPARTMENT_OTHER): Payer: Medicare Other | Admitting: Physical Therapy

## 2021-08-12 DIAGNOSIS — M545 Low back pain, unspecified: Secondary | ICD-10-CM | POA: Diagnosis not present

## 2021-08-12 DIAGNOSIS — M25562 Pain in left knee: Secondary | ICD-10-CM

## 2021-08-12 DIAGNOSIS — M542 Cervicalgia: Secondary | ICD-10-CM

## 2021-08-12 DIAGNOSIS — M6281 Muscle weakness (generalized): Secondary | ICD-10-CM

## 2021-08-12 DIAGNOSIS — M25552 Pain in left hip: Secondary | ICD-10-CM

## 2021-08-12 NOTE — Therapy (Signed)
[facility] [address] [place], [place], [address] Phone: [phone]   Fax:  [phone]  Physical Therapy Treatment  Patient Details  Name: Victoria Guzman MRN: [id number] Date of Birth: [birth year] Referring Provider (PT): Eunice Blase, MD   Encounter Date: 08/12/2021   PT End of Session - 08/12/21 1632     Visit Number 8    Number of Visits 16    Date for PT Re-Evaluation 08/30/21    PT Start Time 6010    PT Stop Time 1654    PT Time Calculation (min) 45 min    Equipment Utilized During Treatment Other (comment)   neck float, squoodles, ankle floats, and kickboard   Activity Tolerance Patient tolerated treatment well    Behavior During Therapy [facility] for tasks assessed/performed             Past Medical History:  Diagnosis Date   Anemia    Anxiety    Chronic fatigue    Chronic insomnia 05/31/2016   Depression    Family history of adverse reaction to anesthesia    mother difficult to wake up after CABG   Fibromyalgia    Hypertension    Insomnia    Panic attack    Stroke ([place])    tia   Vitamin D deficiency 05/31/2016    Past Surgical History:  Procedure Laterality Date   ABDOMINAL HYSTERECTOMY     COLONOSCOPY WITH PROPOFOL N/A 07/24/2018   Procedure: COLONOSCOPY WITH PROPOFOL;  Surgeon: Toledo, Benay Pike, MD;  Location: [facility];  Service: Gastroenterology;  Laterality: N/A;   ESOPHAGOGASTRODUODENOSCOPY (EGD) WITH PROPOFOL N/A 07/24/2018   Procedure: ESOPHAGOGASTRODUODENOSCOPY (EGD) WITH PROPOFOL;  Surgeon: Toledo, Benay Pike, MD;  Location: [facility];  Service: Gastroenterology;  Laterality: N/A;   LAPAROSCOPIC BILATERAL SALPINGECTOMY Bilateral 01/25/2019   Procedure: LAPAROSCOPIC BILATERAL SALPINGECTOMY;  Surgeon: Schermerhorn, Gwen Her, MD;  Location: [facility];  Service: Gynecology;  Laterality: Bilateral;   PARATHYROIDECTOMY     PARTIAL HYSTERECTOMY     TRACHELECTOMY N/A 01/25/2019    Procedure: TRACHELECTOMY, laparoscopic cervical;  Surgeon: Schermerhorn, Gwen Her, MD;  Location: [facility];  Service: Gynecology;  Laterality: N/A;    There were no vitals filed for this visit.   Subjective Assessment - 08/12/21 1611     Subjective "I'm hurting today"  Pt c/o's of significant pain in L hip and L sided back pain.  Pt states she has been hurting worse since she tried to sweep her kitchen.  Pt states she has had a setback.  Pt reports she had to take pain meds and didnt' want to get OOB yesterday.  Pt reports no recent functional improvements, feels about the same.  Pt denies any adverse effects after prior Rx.    Pertinent History TIA in 2015, Fibromyalgia, depression, Grade I anterolisthesis L4-5    Diagnostic tests Cervical, lumbar, Thoracic, and Hip X rays.  Lumbar MRI:  Grade I anterolisthesis L4-5, moderate central canal stenosis and neuroforaminal narrowing, L3-4 mild DDD, synovial cyst buried in R ant medial facet jt at L4-5.  L hip MRI in April indicated no acute osseous abnormalities, possible L anterosuperior chondral labral fissure, possible capsular sprain and/or obutartor strain, and probable R glute medius calcific tendinosis.    Currently in Pain? Yes    Pain Score 5     Pain Location Knee   bilat   Pain Score 9    Pain Location --   L sided lumbar   Pain Score  9    Pain Location Hip    Pain Orientation Left            Pt seen for aquatic therapy today.  Treatment took place in water 3.5-4.5 ft in depth at the [facility].  Temperature of the pool was 94 deg.  Pt entered/exited the pool via stairs independently with bilat rail.   Reviewed response to prior Rx, HEP compliance, pain level, and current function.   -Pt ambulated 5 laps and sidestepped 3 laps with shoulder abd/add.   -Pt performed:     -marching 2 x 10 reps with UE assist on pool wall.     -Standing Hip flex 2x10 reps and standing hip abd 2x10 reps with UE assist on pool edge.       -Kickboard push/pull with TrA contraction 2x10 reps.     -Standing LE noodle pushdowns x 20 rep each with 1 UE assist on pool edge.     -seated bicycles 2 x 20 reps     -Passive floating with ankle floats, neck float, and 2 squoodles to promote relaxation and improve pain, tightness, and muscle tension.  PT also provided side to side perturbations for improved core strength.   Pt requires buoyancy for support and to offload joints with strengthening exercises. Viscosity of the water is needed for resistance of strengthening; water current perturbations provides challenge to standing balance unsupported, requiring increased core activation   Pt sat in the jacuzzi after Rx and stated she felt good in the jacuzzi.        PT Education - 08/12/21 2239     Education Details Educated pt in correct form with exercises.  Provided encouragement to pt concerning pt's sx's and increased pain level.  Instructed pt to continue to be mobile.    Person(s) Educated Patient    Methods Explanation;Demonstration;Verbal cues    Comprehension Returned demonstration;Verbalized understanding              PT Short Term Goals - 08/05/21 1639       PT SHORT TERM GOAL #1   Title Pt will tolerate initiation of aquatic therapy without adverse effects for improved mobility and tolerance to activity.    Time 2    Period Weeks    Status Achieved    Target Date 07/19/21      PT SHORT TERM GOAL #2   Title Pt will report at least a 25% improvement in pain/sx's and mobility overall.    Time 3    Period Weeks    Status Partially Met    Target Date 07/27/21      PT SHORT TERM GOAL #3   Title Pt will report she is able to stand for longer duration including to wash her dishes and perform laundry without sitting down with reduced pain.    Time 4    Period Weeks    Status On-going    Target Date 08/02/21      PT SHORT TERM GOAL #4   Title Pt will demo improved cervical AROM to 35 deg in L SB'ing and WFL in  R rotation and improved lumbar AROM by at least 25% in flex, SB'ing, and rotation for improved daily mobility.    Time 4    Period Weeks    Status Partially Met    Target Date 08/02/21      PT SHORT TERM GOAL #5   Title Pt will demo improved speed and ease with sit/stand transfers and  report she is able to perform toilet transfers without difficulty.    Time 4    Period Weeks    Status On-going               PT Long Term Goals - 08/05/21 1705       PT LONG TERM GOAL #1   Title Pt will be independent with aquatic HEP for improved pain, strength, tolerance to activity, and mobility.    Time 8    Period Weeks    Status On-going    Target Date 08/30/21      PT LONG TERM GOAL #2   Title Pt will report she is able to perform her normal daily transfers without difficulty or significant pain.    Time 8    Period Weeks    Status Not Met    Target Date 08/30/21      PT LONG TERM GOAL #3   Title Pt will be able to ambulate community distance including grocery shopping without significant limitation or pain.    Time 8    Period Weeks    Status On-going    Target Date 08/30/21      PT LONG TERM GOAL #4   Title Pt will be able to stand to perform her normal household chores without significant limitation.    Time 8    Period Weeks    Status On-going      PT LONG TERM GOAL #5   Title Pt will be able to take of her grandchild with improved ease and less pain.    Time 8    Period Weeks    Status On-going    Target Date 08/30/21      PT LONG TERM GOAL #6   Title Pt will demo improved LE strength to 4/5 MMT in L hip and knee and 5/5 in R knee for improved tolerance with and performance of functional mobility.    Time 8    Period Weeks    Status Partially Met    Target Date 08/30/21                   Plan - 08/12/21 1623     Clinical Impression Statement Pt presents to Rx c/o'ing of increased L hip and lumbar pain and reports having increased pain since she  tried to sweep her kitchen.  Pt is discouraged with increased pain and states she is having a setback.  Pt seemed to have pain with ambulating in the pool as evidenced by facial grimacing.  PT had pt perform ambulation and sidestepping in deeper water in order to unweight joints more and to reduce pain with gait activities.  Pt gave good effort with all exercises.  She responded well to Rx reporting improved pain after Rx though continued to have pain.  Pt should benefit from cont skilled PT services to address ongoing goals, improve tolerance to activity, and improve functional mobility.    Comorbidities Fibromyalgia, depression    PT Treatment/Interventions ADLs/Self Care Home Management;Aquatic Therapy;Cryotherapy;Electrical Stimulation;Ultrasound;Moist Heat;Gait training;Stair training;Functional mobility training;Therapeutic activities;Therapeutic exercise;Neuromuscular re-education;Balance training;Manual techniques;Patient/family education;Passive range of motion;Dry needling    PT Next Visit Plan Cont with Aquatic therapy.    PT Home Exercise Plan Pt has an extensive HEP from her prior land based PT.    Consulted and Agree with Plan of Care Patient             Patient will benefit from skilled therapeutic intervention in order  to improve the following deficits and impairments:  Decreased activity tolerance, Decreased mobility, Decreased endurance, Decreased range of motion, Decreased strength, Difficulty walking, Increased fascial restricitons, Postural dysfunction, Pain  Visit Diagnosis: Low back pain, unspecified back pain laterality, unspecified chronicity, unspecified whether sciatica present  Cervicalgia  Muscle weakness (generalized)  Pain in left hip  Left knee pain, unspecified chronicity     Problem List Patient Active Problem List   Diagnosis Date Noted   Post-operative state 01/25/2019   ARF (acute renal failure) ([place]) 04/24/2018   Acute renal failure (ARF) ([place])  04/22/2018   Hypercalcemia 05/31/2016   Hypokalemia 05/31/2016   Hypochloremia 05/31/2016   Screening for HIV (human immunodeficiency virus) 05/31/2016   Screen for STD (sexually transmitted disease) 05/31/2016   Vitamin D deficiency 05/31/2016   Chronic insomnia 05/31/2016   Absolute anemia 07/07/2015   Clinical depression 07/07/2015   Essential hypertension 07/07/2015   Acute anxiety 07/07/2015   Primary fibromyalgia syndrome 49/97/1820   Embolic stroke ([place]) 99/05/8933    Selinda Michaels III PT, DPT 08/12/21 11:09 PM   [facility] [address] [place], [place], [address] Phone: [phone]   Fax:  [phone]  Name: DENITRA DONAGHEY MRN: [id number] Date of Birth: 1966/06/17

## 2021-08-17 ENCOUNTER — Other Ambulatory Visit: Payer: Self-pay

## 2021-08-17 ENCOUNTER — Ambulatory Visit (HOSPITAL_BASED_OUTPATIENT_CLINIC_OR_DEPARTMENT_OTHER): Payer: Medicare Other | Admitting: Physical Therapy

## 2021-08-17 DIAGNOSIS — M25552 Pain in left hip: Secondary | ICD-10-CM

## 2021-08-17 DIAGNOSIS — M545 Low back pain, unspecified: Secondary | ICD-10-CM | POA: Diagnosis not present

## 2021-08-17 DIAGNOSIS — M6281 Muscle weakness (generalized): Secondary | ICD-10-CM

## 2021-08-17 DIAGNOSIS — M542 Cervicalgia: Secondary | ICD-10-CM

## 2021-08-17 DIAGNOSIS — M25562 Pain in left knee: Secondary | ICD-10-CM
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
## 2021-08-19 ENCOUNTER — Other Ambulatory Visit: Payer: Self-pay

## 2021-08-19 ENCOUNTER — Ambulatory Visit (HOSPITAL_BASED_OUTPATIENT_CLINIC_OR_DEPARTMENT_OTHER): Payer: Medicare Other | Admitting: Physical Therapy

## 2021-08-19 ENCOUNTER — Encounter (HOSPITAL_BASED_OUTPATIENT_CLINIC_OR_DEPARTMENT_OTHER): Payer: Self-pay | Admitting: Physical Therapy

## 2021-08-19 DIAGNOSIS — M545 Low back pain, unspecified: Secondary | ICD-10-CM

## 2021-08-19 DIAGNOSIS — M25552 Pain in left hip: Secondary | ICD-10-CM

## 2021-08-19 DIAGNOSIS — M25562 Pain in left knee: Secondary | ICD-10-CM

## 2021-08-19 DIAGNOSIS — M6281 Muscle weakness (generalized): Secondary | ICD-10-CM

## 2021-08-19 DIAGNOSIS — M542 Cervicalgia: Secondary | ICD-10-CM
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
## 2021-08-23 ENCOUNTER — Ambulatory Visit (HOSPITAL_BASED_OUTPATIENT_CLINIC_OR_DEPARTMENT_OTHER): Payer: Medicare Other | Admitting: Physical Therapy

## 2021-08-23 ENCOUNTER — Other Ambulatory Visit: Payer: Self-pay

## 2021-08-23 DIAGNOSIS — M545 Low back pain, unspecified: Secondary | ICD-10-CM | POA: Diagnosis not present

## 2021-08-23 DIAGNOSIS — M6281 Muscle weakness (generalized): Secondary | ICD-10-CM

## 2021-08-23 DIAGNOSIS — M25562 Pain in left knee: Secondary | ICD-10-CM

## 2021-08-23 DIAGNOSIS — M542 Cervicalgia: Secondary | ICD-10-CM

## 2021-08-23 DIAGNOSIS — M25552 Pain in left hip: Secondary | ICD-10-CM

## 2021-08-23 NOTE — Therapy (Signed)
[facility] [address] [place], [place], [address] Phone: [phone]   Fax:  [phone]  Physical Therapy Treatment  Patient Details  Name: Victoria Guzman MRN: [id number] Date of Birth: August 24, 1966 Referring Provider (PT): Eunice Blase, MD   Encounter Date: 08/23/2021   PT End of Session - 08/23/21 1706     Visit Number 11    Number of Visits 16    Date for PT Re-Evaluation 08/30/21    PT Start Time 1612    PT Stop Time 1657    PT Time Calculation (min) 45 min    Equipment Utilized During Treatment Other (comment)   neck float, squoodles, ankle floats, and kickboard   Activity Tolerance Patient tolerated treatment well    Behavior During Therapy [facility] for tasks assessed/performed             Past Medical History:  Diagnosis Date   Anemia    Anxiety    Chronic fatigue    Chronic insomnia 05/31/2016   Depression    Family history of adverse reaction to anesthesia    mother difficult to wake up after CABG   Fibromyalgia    Hypertension    Insomnia    Panic attack    Stroke ([place])    tia   Vitamin D deficiency 05/31/2016    Past Surgical History:  Procedure Laterality Date   ABDOMINAL HYSTERECTOMY     COLONOSCOPY WITH PROPOFOL N/A 07/24/2018   Procedure: COLONOSCOPY WITH PROPOFOL;  Surgeon: Toledo, Benay Pike, MD;  Location: [facility];  Service: Gastroenterology;  Laterality: N/A;   ESOPHAGOGASTRODUODENOSCOPY (EGD) WITH PROPOFOL N/A 07/24/2018   Procedure: ESOPHAGOGASTRODUODENOSCOPY (EGD) WITH PROPOFOL;  Surgeon: Toledo, Benay Pike, MD;  Location: [facility];  Service: Gastroenterology;  Laterality: N/A;   LAPAROSCOPIC BILATERAL SALPINGECTOMY Bilateral 01/25/2019   Procedure: LAPAROSCOPIC BILATERAL SALPINGECTOMY;  Surgeon: Schermerhorn, Gwen Her, MD;  Location: [facility];  Service: Gynecology;  Laterality: Bilateral;   PARATHYROIDECTOMY     PARTIAL HYSTERECTOMY     TRACHELECTOMY N/A 01/25/2019    Procedure: TRACHELECTOMY, laparoscopic cervical;  Surgeon: Schermerhorn, Gwen Her, MD;  Location: [facility];  Service: Gynecology;  Laterality: N/A;    There were no vitals filed for this visit.   Subjective Assessment - 08/23/21 1614     Subjective Pt states she had increased pain after prior Rx.  Pt states she felt rough after Rx though pain returned to her baseline the following day.  Pt has been taking more pain meds recently.  She is having difficulty sleeping.  Pt went to [employer] with a friend 3 days straight.  She states she performed 2 exercises there, lat pulldowns and torso rotation, but mainly went there to use a massage chair and a Lawyer.  Pt felt good for the 1st 2 days though had increased pain and felt rough on the 3rd day.    Pertinent History TIA in 2015, Fibromyalgia, depression, Grade I anterolisthesis L4-5    Limitations Sitting;Walking;Standing;Lifting;House hold activities    Diagnostic tests Cervical, lumbar, Thoracic, and Hip X rays.  Lumbar MRI:  Grade I anterolisthesis L4-5, moderate central canal stenosis and neuroforaminal narrowing, L3-4 mild DDD, synovial cyst buried in R ant medial facet jt at L4-5.  L hip MRI in April indicated no acute osseous abnormalities, possible L anterosuperior chondral labral fissure, possible capsular sprain and/or obutartor strain, and probable R glute medius calcific tendinosis.    Pain Score 8     Pain Location --  bilat lumbar flanks and L hip   Pain Score 5    Pain Location Knee    Pain Orientation Right;Left               Pt seen for aquatic therapy today.  Treatment took place in water 3.5-4.5 ft in depth at the [facility].  Temperature of the pool was 94 deg.  Pt entered/exited the pool via stairs independently with bilat rail.   Reviewed response to prior Rx, HEP compliance, pain level, and current function.   -Pt ambulated fwd and bwd and sidestepped with shoulder abd/add.   -Pt  performed:     -marching 2 x 10 reps with noodle for support.     -Standing Hip flex 2x10 reps and standing hip abd 2x10 reps with UE assist on pool edge.      -Kickboard push/pull with TrA contraction x30 reps.     -Noodle pooldowns (shoulder ext) 2x10 reps     -Standing LE noodle pushdowns x 30 rep each with 1 UE assist on pool edge.     -seated bicycles and flutter kicks 2 x 20 reps     -Pt performed fast ambulation to increase resistance in water with cuing for reciprocal arm swing     -Passive floating with ankle floats, neck float, and 1 squoodle to promote relaxation and improve pain, tightness, and muscle tension.  PT also provided side to side perturbations and push/pull for improved core strength.   Pt requires buoyancy for support and to offload joints with strengthening exercises. Viscosity of the water is needed for resistance of strengthening; water current perturbations provides challenge to standing balance unsupported, requiring increased core activation   Pt sat in the jacuzzi after Rx (non-billed) and stated she felt good in the jacuzzi.       PT Education - 08/23/21 1706     Education Details Instructed pt in correct form with exercises.    Person(s) Educated Patient    Methods Explanation;Demonstration;Verbal cues    Comprehension Returned demonstration;Verbalized understanding              PT Short Term Goals - 08/05/21 1639       PT SHORT TERM GOAL #1   Title Pt will tolerate initiation of aquatic therapy without adverse effects for improved mobility and tolerance to activity.    Time 2    Period Weeks    Status Achieved    Target Date 07/19/21      PT SHORT TERM GOAL #2   Title Pt will report at least a 25% improvement in pain/sx's and mobility overall.    Time 3    Period Weeks    Status Partially Met    Target Date 07/27/21      PT SHORT TERM GOAL #3   Title Pt will report she is able to stand for longer duration including to wash her dishes  and perform laundry without sitting down with reduced pain.    Time 4    Period Weeks    Status On-going    Target Date 08/02/21      PT SHORT TERM GOAL #4   Title Pt will demo improved cervical AROM to 35 deg in L SB'ing and WFL in R rotation and improved lumbar AROM by at least 25% in flex, SB'ing, and rotation for improved daily mobility.    Time 4    Period Weeks    Status Partially Met    Target Date 08/02/21  PT SHORT TERM GOAL #5   Title Pt will demo improved speed and ease with sit/stand transfers and report she is able to perform toilet transfers without difficulty.    Time 4    Period Weeks    Status On-going               PT Long Term Goals - 08/05/21 1705       PT LONG TERM GOAL #1   Title Pt will be independent with aquatic HEP for improved pain, strength, tolerance to activity, and mobility.    Time 8    Period Weeks    Status On-going    Target Date 08/30/21      PT LONG TERM GOAL #2   Title Pt will report she is able to perform her normal daily transfers without difficulty or significant pain.    Time 8    Period Weeks    Status Not Met    Target Date 08/30/21      PT LONG TERM GOAL #3   Title Pt will be able to ambulate community distance including grocery shopping without significant limitation or pain.    Time 8    Period Weeks    Status On-going    Target Date 08/30/21      PT LONG TERM GOAL #4   Title Pt will be able to stand to perform her normal household chores without significant limitation.    Time 8    Period Weeks    Status On-going      PT LONG TERM GOAL #5   Title Pt will be able to take of her grandchild with improved ease and less pain.    Time 8    Period Weeks    Status On-going    Target Date 08/30/21      PT LONG TERM GOAL #6   Title Pt will demo improved LE strength to 4/5 MMT in L hip and knee and 5/5 in R knee for improved tolerance with and performance of functional mobility.    Time 8    Period Weeks     Status Partially Met    Target Date 08/30/21                   Plan - 08/23/21 1707     Clinical Impression Statement Pt continues to c/o of increased pain since sweeping her kitchen.  Pt continues to report high levels of pain and is limited with functional mobility and tolerance to activity.  Overall she has improved with tolerance to activity and mobility.  Pt has difficulty sleeping due to pain.  Pt gives good effort with all exercises and performed exercises well with cuing for correct form.  Decreased support with floating by only performing with 1 squoodle today.  Pt responded well to Rx reporting no change in pain after Rx. Pt should benefit from cont skilled PT services/aquatic therapy to address ongoing goals, improve tolerance to activity, and improve functional mobility    Comorbidities Fibromyalgia, depression    PT Treatment/Interventions ADLs/Self Care Home Management;Aquatic Therapy;Cryotherapy;Electrical Stimulation;Ultrasound;Moist Heat;Gait training;Stair training;Functional mobility training;Therapeutic activities;Therapeutic exercise;Neuromuscular re-education;Balance training;Manual techniques;Patient/family education;Passive range of motion;Dry needling    PT Next Visit Plan Cont with Aquatic therapy.    PT Home Exercise Plan Pt has an extensive HEP from her prior land based PT.    Consulted and Agree with Plan of Care Patient             Patient  will benefit from skilled therapeutic intervention in order to improve the following deficits and impairments:  Decreased activity tolerance, Decreased mobility, Decreased endurance, Decreased range of motion, Decreased strength, Difficulty walking, Increased fascial restricitons, Postural dysfunction, Pain  Visit Diagnosis: Low back pain, unspecified back pain laterality, unspecified chronicity, unspecified whether sciatica present  Cervicalgia  Muscle weakness (generalized)  Pain in left hip  Left knee pain,  unspecified chronicity     Problem List Patient Active Problem List   Diagnosis Date Noted   Post-operative state 01/25/2019   ARF (acute renal failure) ([place]) 04/24/2018   Acute renal failure (ARF) ([place]) 04/22/2018   Hypercalcemia 05/31/2016   Hypokalemia 05/31/2016   Hypochloremia 05/31/2016   Screening for HIV (human immunodeficiency virus) 05/31/2016   Screen for STD (sexually transmitted disease) 05/31/2016   Vitamin D deficiency 05/31/2016   Chronic insomnia 05/31/2016   Absolute anemia 07/07/2015   Clinical depression 07/07/2015   Essential hypertension 07/07/2015   Acute anxiety 07/07/2015   Primary fibromyalgia syndrome 30/73/5430   Embolic stroke ([place]) 14/84/0397    Selinda Michaels III PT, DPT 08/23/21 5:17 PM   [facility] [address] [place], [place], [address] Phone: [phone]   Fax:  [phone]  Name: RANDILYN FOISY MRN: [id number] Date of Birth: 18-Jul-1966

## 2021-08-26 ENCOUNTER — Ambulatory Visit (HOSPITAL_BASED_OUTPATIENT_CLINIC_OR_DEPARTMENT_OTHER): Payer: Medicare Other | Admitting: Physical Therapy

## 2021-08-26 ENCOUNTER — Encounter (HOSPITAL_BASED_OUTPATIENT_CLINIC_OR_DEPARTMENT_OTHER): Payer: Self-pay | Admitting: Physical Therapy

## 2021-08-26 ENCOUNTER — Other Ambulatory Visit: Payer: Self-pay

## 2021-08-26 DIAGNOSIS — M25552 Pain in left hip: Secondary | ICD-10-CM

## 2021-08-26 DIAGNOSIS — M545 Low back pain, unspecified: Secondary | ICD-10-CM | POA: Diagnosis not present

## 2021-08-26 DIAGNOSIS — M25562 Pain in left knee: Secondary | ICD-10-CM

## 2021-08-26 DIAGNOSIS — M542 Cervicalgia: Secondary | ICD-10-CM

## 2021-08-26 DIAGNOSIS — M6281 Muscle weakness (generalized): Secondary | ICD-10-CM

## 2021-08-26 NOTE — Therapy (Signed)
[place], [place], [address] Phone: [phone]   Fax:  [phone]  Physical Therapy Treatment  Patient Details  Name: Victoria Guzman MRN: [id number] Date of Birth: May 25, 1966 Referring Provider (PT): Eunice Blase, MD   Encounter Date: 08/26/2021   PT End of Session - 08/26/21 1621     Visit Number 12    Number of Visits 16    Date for PT Re-Evaluation 08/30/21    PT Start Time 9563    PT Stop Time 1658    PT Time Calculation (min) 45 min    Equipment Utilized During Treatment Other (comment)   neck float, squoodles, ankle floats, and kickboard   Activity Tolerance Patient tolerated treatment well    Behavior During Therapy [facility] for tasks assessed/performed             Past Medical History:  Diagnosis Date   Anemia    Anxiety    Chronic fatigue    Chronic insomnia 05/31/2016   Depression    Family history of adverse reaction to anesthesia    mother difficult to wake up after CABG   Fibromyalgia    Hypertension    Insomnia    Panic attack    Stroke ([place])    tia   Vitamin D deficiency 05/31/2016    Past Surgical History:  Procedure Laterality Date   ABDOMINAL HYSTERECTOMY     COLONOSCOPY WITH PROPOFOL N/A 07/24/2018   Procedure: COLONOSCOPY WITH PROPOFOL;  Surgeon: Toledo, Benay Pike, MD;  Location: [facility];  Service: Gastroenterology;  Laterality: N/A;   ESOPHAGOGASTRODUODENOSCOPY (EGD) WITH PROPOFOL N/A 07/24/2018   Procedure: ESOPHAGOGASTRODUODENOSCOPY (EGD) WITH PROPOFOL;  Surgeon: Toledo, Benay Pike, MD;  Location: [facility];  Service: Gastroenterology;  Laterality: N/A;   LAPAROSCOPIC BILATERAL SALPINGECTOMY Bilateral 01/25/2019   Procedure: LAPAROSCOPIC BILATERAL SALPINGECTOMY;  Surgeon: Schermerhorn, Gwen Her, MD;  Location: [facility];  Service: Gynecology;  Laterality: Bilateral;   PARATHYROIDECTOMY     PARTIAL HYSTERECTOMY     TRACHELECTOMY N/A 01/25/2019    Procedure: TRACHELECTOMY, laparoscopic cervical;  Surgeon: Schermerhorn, Gwen Her, MD;  Location: [facility] OR[facility];  Service: Gynecology;  Laterality: N/A;    There were no vitals filed for this visit.   Subjective Assessment - 08/26/21 2210     Subjective Pt states she is always in pain though didn't have increased pain after prior Rx.  Pt has been taking more pain meds recently.  She is having difficulty sleeping.  Pt went back to [employer]  and used the massage chair and a Lawyer and states she felt fine using afterwards.  Pt continues to have constant pain and limitations with mobility.  Pt report she feels the same and has had no recent improvements.    Pertinent History TIA in 2015, Fibromyalgia, depression, Grade I anterolisthesis L4-5    Diagnostic tests Cervical, lumbar, Thoracic, and Hip X rays.  Lumbar MRI:  Grade I anterolisthesis L4-5, moderate central canal stenosis and neuroforaminal narrowing, L3-4 mild DDD, synovial cyst buried in R ant medial facet jt at L4-5.  L hip MRI in April indicated no acute osseous abnormalities, possible L anterosuperior chondral labral fissure, possible capsular sprain and/or obutartor strain, and probable R glute medius calcific tendinosis.    Currently in Pain? Yes    Pain Score 6     Pain Location Knee    Pain Orientation Right;Left    Pain Score 6    Pain Location --   lumbar  Pain Score 7    Pain Location Hip    Pain Orientation Left               Pt seen for aquatic therapy today.  Treatment took place in water 3.5-4.5 ft in depth at the [facility].  Temperature of the pool was 94 deg.  Pt entered/exited the pool via stairs independently with bilat rail.   Reviewed response to prior Rx, HEP compliance, pain level, and current function.   -Pt ambulated fwd and bwd and sidestepped with shoulder abd/add x 5 laps each.   -Pt performed:     -marching 2 x 10 reps with noodle for support.     -Standing Hip flex 2x10  reps and standing hip abd 2x10 reps with UE assist on pool edge.      -Kickboard push/pull with TrA contraction x30 reps.     -Noodle pooldowns (shoulder ext) 2x10 reps     -Standing LE noodle pushdowns x 30 rep each with 1 UE assist on pool edge.     -seated bicycles 2 x 20 reps     -Pt performed fast walking x 2 laps to increase resistance in water with cuing for reciprocal arm swing     -Standing HS stretch on step 2x20-30 seconds bilat     -Passive floating with ankle floats, neck float, and 1 squoodle to promote relaxation and improve pain, tightness, and muscle tension.  PT also provided side to side perturbations and push/pull for improved core strength.   Pt requires buoyancy for support and to offload joints with strengthening exercises. Viscosity of the water is needed for resistance of strengthening; water current perturbations provides challenge to standing balance unsupported, requiring increased core activation   Pt sat in the jacuzzi after Rx (non-billed) and stated she felt good in the jacuzzi.        PT Education - 08/26/21 2221     Education Details Educated pt concerning POC    Person(s) Educated Patient    Methods Explanation    Comprehension Verbalized understanding              PT Short Term Goals - 08/05/21 1639       PT SHORT TERM GOAL #1   Title Pt will tolerate initiation of aquatic therapy without adverse effects for improved mobility and tolerance to activity.    Time 2    Period Weeks    Status Achieved    Target Date 07/19/21      PT SHORT TERM GOAL #2   Title Pt will report at least a 25% improvement in pain/sx's and mobility overall.    Time 3    Period Weeks    Status Partially Met    Target Date 07/27/21      PT SHORT TERM GOAL #3   Title Pt will report she is able to stand for longer duration including to wash her dishes and perform laundry without sitting down with reduced pain.    Time 4    Period Weeks    Status On-going     Target Date 08/02/21      PT SHORT TERM GOAL #4   Title Pt will demo improved cervical AROM to 35 deg in L SB'ing and WFL in R rotation and improved lumbar AROM by at least 25% in flex, SB'ing, and rotation for improved daily mobility.    Time 4    Period Weeks    Status Partially Met  Target Date 08/02/21      PT SHORT TERM GOAL #5   Title Pt will demo improved speed and ease with sit/stand transfers and report she is able to perform toilet transfers without difficulty.    Time 4    Period Weeks    Status On-going               PT Long Term Goals - 08/05/21 1705       PT LONG TERM GOAL #1   Title Pt will be independent with aquatic HEP for improved pain, strength, tolerance to activity, and mobility.    Time 8    Period Weeks    Status On-going    Target Date 08/30/21      PT LONG TERM GOAL #2   Title Pt will report she is able to perform her normal daily transfers without difficulty or significant pain.    Time 8    Period Weeks    Status Not Met    Target Date 08/30/21      PT LONG TERM GOAL #3   Title Pt will be able to ambulate community distance including grocery shopping without significant limitation or pain.    Time 8    Period Weeks    Status On-going    Target Date 08/30/21      PT LONG TERM GOAL #4   Title Pt will be able to stand to perform her normal household chores without significant limitation.    Time 8    Period Weeks    Status On-going      PT LONG TERM GOAL #5   Title Pt will be able to take of her grandchild with improved ease and less pain.    Time 8    Period Weeks    Status On-going    Target Date 08/30/21      PT LONG TERM GOAL #6   Title Pt will demo improved LE strength to 4/5 MMT in L hip and knee and 5/5 in R knee for improved tolerance with and performance of functional mobility.    Time 8    Period Weeks    Status Partially Met    Target Date 08/30/21                   Plan - 08/26/21 2221     Clinical  Impression Statement Pt continues to have high levels of constant pain.  She is limited with mobility and peformance of IADLs including household chores due to pain.  Pt has limited tolerance to activity due to pain.  Pt reports no recent improvements including functional improvements.  Pt gives good effort with all aquatic exercises and performed aquatic exercises well.  She continues to progress with aquatic exercises as evidenced by increasing repetitions of exercises.  Pt responded well to Rx having no increased pain after Rx.    Comorbidities Fibromyalgia, depression    PT Treatment/Interventions ADLs/Self Care Home Management;Aquatic Therapy;Cryotherapy;Electrical Stimulation;Ultrasound;Moist Heat;Gait training;Stair training;Functional mobility training;Therapeutic activities;Therapeutic exercise;Neuromuscular re-education;Balance training;Manual techniques;Patient/family education;Passive range of motion;Dry needling    PT Next Visit Plan Progress Note on [place] next Rx.    PT Home Exercise Plan Pt has an extensive HEP from her prior land based PT.    Consulted and Agree with Plan of Care Patient             Patient will benefit from skilled therapeutic intervention in order to improve the following deficits and impairments:  Decreased  activity tolerance, Decreased mobility, Decreased endurance, Decreased range of motion, Decreased strength, Difficulty walking, Increased fascial restricitons, Postural dysfunction, Pain  Visit Diagnosis: Low back pain, unspecified back pain laterality, unspecified chronicity, unspecified whether sciatica present  Cervicalgia  Muscle weakness (generalized)  Pain in left hip  Left knee pain, unspecified chronicity     Problem List Patient Active Problem List   Diagnosis Date Noted   Post-operative state 01/25/2019   ARF (acute renal failure) ([place]) 04/24/2018   Acute renal failure (ARF) ([place]) 04/22/2018   Hypercalcemia 05/31/2016   Hypokalemia  05/31/2016   Hypochloremia 05/31/2016   Screening for HIV (human immunodeficiency virus) 05/31/2016   Screen for STD (sexually transmitted disease) 05/31/2016   Vitamin D deficiency 05/31/2016   Chronic insomnia 05/31/2016   Absolute anemia 07/07/2015   Clinical depression 07/07/2015   Essential hypertension 07/07/2015   Acute anxiety 07/07/2015   Primary fibromyalgia syndrome 32/44/0102   Embolic stroke ([place]) 72/53/6644    Selinda Michaels III PT, DPT 08/26/21 10:37 PM   [facility] [address] [place], [place], [address] Phone: [phone]   Fax:  [phone]  Name: MARKISHA MEDING MRN: [id number] Date of Birth: 1966/02/03

## 2021-08-27 ENCOUNTER — Telehealth: Payer: Self-pay | Admitting: Family Medicine

## 2021-08-27 NOTE — Telephone Encounter (Signed)
Pt called stating Dr.Hilts referred her to a Dr. Suzie Portela name starts with a N but she's not sure if its [facility] or [place]. Pt would like a CB o she can get an appt scheduled. Pt states she's being seen for back, hip and neck pain. Pt would like a CB.   [phone]

## 2021-08-31 ENCOUNTER — Other Ambulatory Visit: Payer: Self-pay

## 2021-08-31 ENCOUNTER — Ambulatory Visit (HOSPITAL_BASED_OUTPATIENT_CLINIC_OR_DEPARTMENT_OTHER): Payer: Medicare Other | Admitting: Physical Therapy

## 2021-08-31 ENCOUNTER — Encounter (HOSPITAL_BASED_OUTPATIENT_CLINIC_OR_DEPARTMENT_OTHER): Payer: Self-pay | Admitting: Physical Therapy

## 2021-08-31 DIAGNOSIS — M542 Cervicalgia: Secondary | ICD-10-CM

## 2021-08-31 DIAGNOSIS — M545 Low back pain, unspecified: Secondary | ICD-10-CM | POA: Diagnosis not present

## 2021-08-31 DIAGNOSIS — M25552 Pain in left hip: Secondary | ICD-10-CM

## 2021-08-31 DIAGNOSIS — M25562 Pain in left knee: Secondary | ICD-10-CM

## 2021-08-31 DIAGNOSIS — M6281 Muscle weakness (generalized): Secondary | ICD-10-CM

## 2021-08-31 NOTE — Therapy (Signed)
[place], [place], [address] Phone: [phone]   Fax:  [phone]  Physical Therapy Treatment/Progress Note  Progress Note Reporting Period 08/05/2021 to 08/31/2021  See note below for Objective Data and Assessment of Progress/Goals.      Patient Details  Name: Victoria Guzman MRN: [id number] Date of Birth: 06/28/1966 Referring Provider (PT): Eunice Blase, MD   Encounter Date: 08/31/2021   PT End of Session - 08/31/21 1715     Visit Number 13    Number of Visits 18    Date for PT Re-Evaluation 09/30/21    Progress Note Due on Visit --   09/30/2021   PT Start Time 1615    PT Stop Time 1703    PT Time Calculation (min) 48 min    Activity Tolerance Patient tolerated treatment well    Behavior During Therapy [facility] for tasks assessed/performed             Past Medical History:  Diagnosis Date   Anemia    Anxiety    Chronic fatigue    Chronic insomnia 05/31/2016   Depression    Family history of adverse reaction to anesthesia    mother difficult to wake up after CABG   Fibromyalgia    Hypertension    Insomnia    Panic attack    Stroke ([place])    tia   Vitamin D deficiency 05/31/2016    Past Surgical History:  Procedure Laterality Date   ABDOMINAL HYSTERECTOMY     COLONOSCOPY WITH PROPOFOL N/A 07/24/2018   Procedure: COLONOSCOPY WITH PROPOFOL;  Surgeon: Toledo, Benay Pike, MD;  Location: [facility];  Service: Gastroenterology;  Laterality: N/A;   ESOPHAGOGASTRODUODENOSCOPY (EGD) WITH PROPOFOL N/A 07/24/2018   Procedure: ESOPHAGOGASTRODUODENOSCOPY (EGD) WITH PROPOFOL;  Surgeon: Toledo, Benay Pike, MD;  Location: [facility];  Service: Gastroenterology;  Laterality: N/A;   LAPAROSCOPIC BILATERAL SALPINGECTOMY Bilateral 01/25/2019   Procedure: LAPAROSCOPIC BILATERAL SALPINGECTOMY;  Surgeon: Schermerhorn, Gwen Her, MD;  Location: [facility];  Service: Gynecology;  Laterality: Bilateral;    PARATHYROIDECTOMY     PARTIAL HYSTERECTOMY     TRACHELECTOMY N/A 01/25/2019   Procedure: TRACHELECTOMY, laparoscopic cervical;  Surgeon: Schermerhorn, Gwen Her, MD;  Location: [facility];  Service: Gynecology;  Laterality: N/A;    There were no vitals filed for this visit.   Subjective Assessment - 08/31/21 1616     Subjective Pt has been feeling better since having the setback.  Pt felt good after prior Rx.  Pt did a lot of walking on Saturday.  Pt was able to go to a football game this past weekend.  Pt held on her friend's arm because there was a lot of people there.  Pt didn't enter the game but stayed at the tailgate and watched the game.  Pt was able to go out and be social after the game though she did not dance.   Pt states she was unable to move her L hip well the following day.  She is having difficulty sleeping.  Pt has been going to [employer] and has been working out and using the [employer].  Pt states she has felt fine going to [employer].  Pt has been walking for 30 mins on TM and takes 1 min breaks when she needs it.  pt was able to ambulate 40 mins on TM one time.  Pt has been using deep blue and cryoderm which imroves pain.  Pt reports improved sitting duration.  Pt is limited with standing activities and household chores.  Pt continues to have difficulty with walking into B/R and toilet transfers though some days are more manageable.  Pt continues to have constant pain and limitations with mobility    Pt will be not seeing Dr. Junius Roads anymore since he left the practice.  Pt states she found out today that the MD following her will be Dr. Louanne Skye.  Pt sees Dr. Louanne Skye on 10/04/2021.  pt states she does have pain with colder weather.    Pertinent History TIA in 2015, Fibromyalgia, depression, Grade I anterolisthesis L4-5    Diagnostic tests Cervical, lumbar, Thoracic, and Hip X rays.  Lumbar MRI:  Grade I anterolisthesis L4-5, moderate central canal stenosis and neuroforaminal  narrowing, L3-4 mild DDD, synovial cyst buried in R ant medial facet jt at L4-5.  L hip MRI in April indicated no acute osseous abnormalities, possible L anterosuperior chondral labral fissure, possible capsular sprain and/or obutartor strain, and probable R glute medius calcific tendinosis.    Currently in Pain? Yes   worst pain:  8-9/10   Pain Score 3     Pain Location Knee    Pain Orientation Right;Left    Pain Score 5    Pain Location --   lumbar   Pain Score 4    Pain Location Hip    Pain Orientation Left    Pain Score 2    Pain Location --   cervical               [facility] PT Assessment - 08/31/21 0001       Observation/Other Assessments   Modified Oswestry 65%   1:4, 2:2, 3:4 4:2, 5:2, 6:3, 7:7, 8:4, 9:4, 10:2.5   Neck Disability Index  25   1:3, 2:1, 3:4, 4:4, 5:0, 6:1, 7:5, 8:3, 9"3, 10:2     AROM   Cervical - Right Side Bend 38    Cervical - Left Side Bend 32 with pain    Cervical - Right Rotation WFL without pain    Cervical - Left Rotation 90% with pain    Lumbar Flexion 75% with pain    Lumbar - Right Side Bend 90% with pain    Lumbar - Left Side Bend 75% with pain    Lumbar - Right Rotation 75% with pain    Lumbar - Left Rotation 60% with pain      Strength   Right Hip Flexion 5/5    Right Hip Extension 4+/5    Right Hip External Rotation  5/5    Right Hip ABduction 5/5    Left Hip Flexion 5/5    Left Hip Extension 4+/5    Left Hip External Rotation 4+/5    Left Hip ABduction 5/5    Right Knee Flexion 5/5    Right Knee Extension 5/5    Left Knee Flexion 5/5    Left Knee Extension 5/5      Palpation   Palpation comment TTP:  L > R UT and cervical paraspinals, slightly in R and significant tenderness in L      Ambulation/Gait   Gait Comments Pt ambulated with a normalized gait without limping.   pain with ambulating 20 ft in the clinic                                   PT Education - 08/31/21 2300  Education Details  Educated pt concerning POC.  Answered Pt's questions.  Educated pt concerning objective findings and progress.    Person(s) Educated Patient    Methods Explanation    Comprehension Verbalized understanding              PT Short Term Goals - 08/31/21 1631       PT SHORT TERM GOAL #1   Title Pt will tolerate initiation of aquatic therapy without adverse effects for improved mobility and tolerance to activity.    Time 2    Period Weeks    Status Achieved    Target Date 07/19/21      PT SHORT TERM GOAL #2   Title Pt will report at least a 25% improvement in pain/sx's and mobility overall.    Baseline Pt reports 40% improvement    Time 3    Period Weeks    Status Achieved    Target Date 07/27/21      PT SHORT TERM GOAL #3   Title Pt will report she is able to stand for longer duration including to wash her dishes and perform laundry without sitting down with reduced pain.    Time 4    Period Weeks    Status Not Met    Target Date 08/02/21      PT SHORT TERM GOAL #4   Title Pt will demo improved cervical AROM to 35 deg in L SB'ing and WFL in R rotation and improved lumbar AROM by at least 25% in flex, SB'ing, and rotation for improved daily mobility.    Time 4    Period Weeks    Status Partially Met    Target Date 08/02/21      PT SHORT TERM GOAL #5   Title Pt will demo improved speed and ease with sit/stand transfers and report she is able to perform toilet transfers without difficulty.    Time 4    Period Weeks    Status Partially Met    Target Date 08/02/21               PT Long Term Goals - 08/31/21 1654       PT LONG TERM GOAL #1   Title Pt will be independent with aquatic HEP for improved pain, strength, tolerance to activity, and mobility.    Time 4    Period Weeks    Status On-going    Target Date 09/28/21      PT LONG TERM GOAL #2   Title Pt will report she is able to perform her normal daily transfers without difficulty or significant pain.     Time 4    Period Weeks    Status Not Met    Target Date 09/28/21      PT LONG TERM GOAL #3   Title Pt will be able to ambulate community distance including grocery shopping without significant limitation or pain.    Time 4    Period Weeks    Status On-going    Target Date 09/28/21      PT LONG TERM GOAL #4   Title Pt will be able to stand to perform her normal household chores without significant limitation.    Time 4    Period Weeks    Status Not Met    Target Date 09/28/21      PT LONG TERM GOAL #5   Title Pt will be able to take of her grandchild with improved ease and less  pain.    Baseline Pt hasn't attempted though states she is not able to pick her up.  Will remove this goal next visit.    Status Not Met      PT LONG TERM GOAL #6   Title Pt will demo improved LE strength to 4/5 MMT in L hip and knee and 5/5 in R knee for improved tolerance with and performance of functional mobility.    Time 8    Period Weeks    Status Achieved    Target Date 08/30/21                   Plan - 08/31/21 1640     Clinical Impression Statement Pt had a setback after sweeping the floor a few weeks ago and was experiencing increased pain.  She presents to Rx today doing better.  She has been going to [employer], using a Lawyer, and also increasing her workouts slowly.  Pt has been ambulating on a TM.  She reports much improvement over this past weekend including increased ambulation distance and sitting duration.  Overall Pt has improved with tolerance to activity and functional mobility skills including ambulation and sitting tolerance.  She continues to be limited with ambulation and ADLs/IADLs.  She is still very limited with standing duration and performaning household chores and cleaning.  Pt continues to have difficulty with toilet transfers though has improved overall with transfers.  Pt demonstates improved gait.  Pt demonstrates improved cervical and lumbar AROM  except a little worse in lumbar R SB'ing.  Pt demonstrates a good improvement in bilat knee and hip strength as evidenced by MMT.  She continues to have constant pain which affects her daily activities and mobility.  Pt is slowly progressing toward goals though has made some progress.  See above for goal progress.  Pt should benefit from cont skilled PT services to address ongoing goals, improve function, and establish independence with aquatic HEP.    Comorbidities Fibromyalgia, depression    PT Frequency --   2x this week and 1x/wk afterward   PT Duration --   2-4 weeks   PT Treatment/Interventions ADLs/Self Care Home Management;Aquatic Therapy;Cryotherapy;Electrical Stimulation;Ultrasound;Moist Heat;Gait training;Stair training;Functional mobility training;Therapeutic activities;Therapeutic exercise;Neuromuscular re-education;Balance training;Manual techniques;Patient/family education;Passive range of motion;Dry needling    PT Next Visit Plan Cont with aquatic therapy    PT Home Exercise Plan Pt has an extensive HEP from her prior land based PT.    Consulted and Agree with Plan of Care Patient             Patient will benefit from skilled therapeutic intervention in order to improve the following deficits and impairments:  Decreased activity tolerance, Decreased mobility, Decreased endurance, Decreased range of motion, Decreased strength, Difficulty walking, Increased fascial restricitons, Postural dysfunction, Pain  Visit Diagnosis: Low back pain, unspecified back pain laterality, unspecified chronicity, unspecified whether sciatica present  Cervicalgia  Muscle weakness (generalized)  Pain in left hip  Left knee pain, unspecified chronicity     Problem List Patient Active Problem List   Diagnosis Date Noted   Post-operative state 01/25/2019   ARF (acute renal failure) ([place]) 04/24/2018   Acute renal failure (ARF) ([place]) 04/22/2018   Hypercalcemia 05/31/2016   Hypokalemia  05/31/2016   Hypochloremia 05/31/2016   Screening for HIV (human immunodeficiency virus) 05/31/2016   Screen for STD (sexually transmitted disease) 05/31/2016   Vitamin D deficiency 05/31/2016   Chronic insomnia 05/31/2016   Absolute anemia 07/07/2015  Clinical depression 07/07/2015   Essential hypertension 07/07/2015   Acute anxiety 07/07/2015   Primary fibromyalgia syndrome 02/16/9457   Embolic stroke ([place]) 59/29/2446    Selinda Michaels III PT, DPT 08/31/21 11:21 PM   [facility] [address] [place], [place], [address] Phone: [phone]   Fax:  [phone]  Name: Victoria Guzman MRN: [id number] Date of Birth: 11-05-1966

## 2021-09-02 ENCOUNTER — Ambulatory Visit (HOSPITAL_BASED_OUTPATIENT_CLINIC_OR_DEPARTMENT_OTHER): Payer: Medicare Other | Admitting: Physical Therapy

## 2021-09-14 ENCOUNTER — Encounter (HOSPITAL_BASED_OUTPATIENT_CLINIC_OR_DEPARTMENT_OTHER): Payer: Self-pay | Admitting: Physical Therapy

## 2021-09-14 ENCOUNTER — Ambulatory Visit (HOSPITAL_BASED_OUTPATIENT_CLINIC_OR_DEPARTMENT_OTHER): Payer: Medicare Other | Attending: Family Medicine | Admitting: Physical Therapy

## 2021-09-14 ENCOUNTER — Other Ambulatory Visit: Payer: Self-pay

## 2021-09-14 DIAGNOSIS — M545 Low back pain, unspecified: Secondary | ICD-10-CM | POA: Insufficient documentation

## 2021-09-14 DIAGNOSIS — M6281 Muscle weakness (generalized): Secondary | ICD-10-CM | POA: Insufficient documentation

## 2021-09-14 DIAGNOSIS — M542 Cervicalgia: Secondary | ICD-10-CM | POA: Insufficient documentation

## 2021-09-14 DIAGNOSIS — M25562 Pain in left knee: Secondary | ICD-10-CM | POA: Insufficient documentation

## 2021-09-14 DIAGNOSIS — M25552 Pain in left hip: Secondary | ICD-10-CM | POA: Insufficient documentation

## 2021-09-14 NOTE — Therapy (Signed)
[facility] [address] [place], [place], [address] Phone: [phone]   Fax:  [phone]  Physical Therapy Treatment  Patient Details  Name: Victoria Guzman MRN: [id number] Date of Birth: 12-10-1965 Referring Provider (PT): Eunice Blase, MD   Encounter Date: 09/14/2021   PT End of Session - 09/14/21 2103     Visit Number 13    Number of Visits 18    Date for PT Re-Evaluation 09/30/21             Past Medical History:  Diagnosis Date   Anemia    Anxiety    Chronic fatigue    Chronic insomnia 05/31/2016   Depression    Family history of adverse reaction to anesthesia    mother difficult to wake up after CABG   Fibromyalgia    Hypertension    Insomnia    Panic attack    Stroke [facility])    tia   Vitamin D deficiency 05/31/2016    Past Surgical History:  Procedure Laterality Date   ABDOMINAL HYSTERECTOMY     COLONOSCOPY WITH PROPOFOL N/A 07/24/2018   Procedure: COLONOSCOPY WITH PROPOFOL;  Surgeon: Toledo, Benay Pike, MD;  Location: [facility];  Service: Gastroenterology;  Laterality: N/A;   ESOPHAGOGASTRODUODENOSCOPY (EGD) WITH PROPOFOL N/A 07/24/2018   Procedure: ESOPHAGOGASTRODUODENOSCOPY (EGD) WITH PROPOFOL;  Surgeon: Toledo, Benay Pike, MD;  Location: [facility];  Service: Gastroenterology;  Laterality: N/A;   LAPAROSCOPIC BILATERAL SALPINGECTOMY Bilateral 01/25/2019   Procedure: LAPAROSCOPIC BILATERAL SALPINGECTOMY;  Surgeon: Schermerhorn, Gwen Her, MD;  Location: [facility] ORS;  Service: Gynecology;  Laterality: Bilateral;   PARATHYROIDECTOMY     PARTIAL HYSTERECTOMY     TRACHELECTOMY N/A 01/25/2019   Procedure: TRACHELECTOMY, laparoscopic cervical;  Surgeon: Schermerhorn, Gwen Her, MD;  Location: [facility];  Service: Gynecology;  Laterality: N/A;    There were no vitals filed for this visit.   Subjective Assessment - 09/14/21 1620     Subjective Pt reports she has been having some female/urinary  issues.  She had a regular gynecologist appt and informed MD about those issues.  Pt states she was dx'd with lichen sclerosus and bacterial vaginosis and was prescribed a steroid cream and antibiotic.  Pt states gynecologist informed her that the pool and clorinated water will worsen these issues and aggravate sx's.  Pt states he also informed her that the cool water in the jacuzzi is not good for these issues.  He  instructed her to discontinue aquatic therapy.                                          PT Short Term Goals - 08/31/21 1631       PT SHORT TERM GOAL #1   Title Pt will tolerate initiation of aquatic therapy without adverse effects for improved mobility and tolerance to activity.    Time 2    Period Weeks    Status Achieved    Target Date 07/19/21      PT SHORT TERM GOAL #2   Title Pt will report at least a 25% improvement in pain/sx's and mobility overall.    Baseline Pt reports 40% improvement    Time 3    Period Weeks    Status Achieved    Target Date 07/27/21      PT SHORT TERM GOAL #3   Title Pt will report she is  able to stand for longer duration including to wash her dishes and perform laundry without sitting down with reduced pain.    Time 4    Period Weeks    Status Not Met    Target Date 08/02/21      PT SHORT TERM GOAL #4   Title Pt will demo improved cervical AROM to 35 deg in L SB'ing and WFL in R rotation and improved lumbar AROM by at least 25% in flex, SB'ing, and rotation for improved daily mobility.    Time 4    Period Weeks    Status Partially Met    Target Date 08/02/21      PT SHORT TERM GOAL #5   Title Pt will demo improved speed and ease with sit/stand transfers and report she is able to perform toilet transfers without difficulty.    Time 4    Period Weeks    Status Partially Met    Target Date 08/02/21               PT Long Term Goals - 08/31/21 1654       PT LONG TERM GOAL #1   Title Pt will be  independent with aquatic HEP for improved pain, strength, tolerance to activity, and mobility.    Time 4    Period Weeks    Status On-going    Target Date 09/28/21      PT LONG TERM GOAL #2   Title Pt will report she is able to perform her normal daily transfers without difficulty or significant pain.    Time 4    Period Weeks    Status Not Met    Target Date 09/28/21      PT LONG TERM GOAL #3   Title Pt will be able to ambulate community distance including grocery shopping without significant limitation or pain.    Time 4    Period Weeks    Status On-going    Target Date 09/28/21      PT LONG TERM GOAL #4   Title Pt will be able to stand to perform her normal household chores without significant limitation.    Time 4    Period Weeks    Status Not Met    Target Date 09/28/21      PT LONG TERM GOAL #5   Title Pt will be able to take of her grandchild with improved ease and less pain.    Baseline Pt hasn't attempted though states she is not able to pick her up.  Will remove this goal next visit.    Status Not Met      PT LONG TERM GOAL #6   Title Pt will demo improved LE strength to 4/5 MMT in L hip and knee and 5/5 in R knee for improved tolerance with and performance of functional mobility.    Time 8    Period Weeks    Status Achieved    Target Date 08/30/21                   Plan - 09/14/21 2103     Clinical Impression Statement Pt spoke with Pt about current health issues and MD orders.  PT Rx not performed today due to MD orders.  Pt is unable to go into the pool due to health issues and MD orders.  Pt will be discharged from skilled PT services.    PT Next Visit Plan Pt is discharged.  pt will  cont with land based HEP and going to [employer].    Consulted and Agree with Plan of Care Patient             Patient will benefit from skilled therapeutic intervention in order to improve the following deficits and impairments:     Visit Diagnosis: Low  back pain, unspecified back pain laterality, unspecified chronicity, unspecified whether sciatica present  Cervicalgia  Muscle weakness (generalized)  Pain in left hip  Left knee pain, unspecified chronicity     Problem List Patient Active Problem List   Diagnosis Date Noted   Post-operative state 01/25/2019   ARF (acute renal failure) ([facility]) 04/24/2018   Acute renal failure (ARF) ([facility]) 04/22/2018   Hypercalcemia 05/31/2016   Hypokalemia 05/31/2016   Hypochloremia 05/31/2016   Screening for HIV (human immunodeficiency virus) 05/31/2016   Screen for STD (sexually transmitted disease) 05/31/2016   Vitamin D deficiency 05/31/2016   Chronic insomnia 05/31/2016   Absolute anemia 07/07/2015   Clinical depression 07/07/2015   Essential hypertension 07/07/2015   Acute anxiety 07/07/2015   Primary fibromyalgia syndrome 09/28/8526   Embolic stroke ([facility]) 78/24/2353    PHYSICAL THERAPY DISCHARGE SUMMARY  Visits from Start of Care: 13  Current functional level related to goals / functional outcomes: See above   Remaining deficits: Not assessed today   Education / Equipment: PT POC   Patient agrees to discharge. Patient goals were not assessed today. Patient is being discharged due to MD orders.  See above   Selinda Michaels III PT, DPT 09/14/21 9:09 PM   [facility] [place], [place], [address] Phone: [phone]   Fax:  [phone]  Name: MYESHA STILLION MRN: [id number] Date of Birth: [birth year]

## 2021-09-21 ENCOUNTER — Ambulatory Visit (HOSPITAL_BASED_OUTPATIENT_CLINIC_OR_DEPARTMENT_OTHER): Payer: Medicare Other | Admitting: Physical Therapy

## 2021-09-28 ENCOUNTER — Ambulatory Visit (HOSPITAL_BASED_OUTPATIENT_CLINIC_OR_DEPARTMENT_OTHER): Payer: Medicare Other | Admitting: Physical Therapy

## 2021-10-04 ENCOUNTER — Ambulatory Visit (INDEPENDENT_AMBULATORY_CARE_PROVIDER_SITE_OTHER): Payer: Medicare Other | Admitting: Specialist

## 2021-10-04 ENCOUNTER — Other Ambulatory Visit: Payer: Self-pay

## 2021-10-04 ENCOUNTER — Encounter: Payer: Self-pay | Admitting: Specialist

## 2021-10-04 VITALS — Ht 66.0 in | Wt 199.0 lb

## 2021-10-04 DIAGNOSIS — M4316 Spondylolisthesis, lumbar region: Secondary | ICD-10-CM

## 2021-10-04 DIAGNOSIS — M5136 Other intervertebral disc degeneration, lumbar region: Secondary | ICD-10-CM | POA: Diagnosis not present

## 2021-10-04 DIAGNOSIS — M542 Cervicalgia: Secondary | ICD-10-CM | POA: Diagnosis not present

## 2021-10-04 NOTE — Patient Instructions (Addendum)
Plan: Avoid bending, stooping and avoid lifting weights greater than 10 lbs. Avoid prolong standing and walking. Avoid frequent bending and stooping  No lifting greater than 10 lbs. May use ice or moist heat for pain. Weight loss is of benefit. Handicap license is approved.  On return redo xrays of the cervical spine and the lumbar spine.
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
## 2021-10-05 ENCOUNTER — Ambulatory Visit (HOSPITAL_BASED_OUTPATIENT_CLINIC_OR_DEPARTMENT_OTHER): Payer: Medicare Other | Admitting: Physical Therapy

## 2022-06-09 NOTE — Discharge Instructions (Addendum)
[facility] ENDOSCOPIC SINUS SURGERY [facility]  What is Functional Endoscopic Sinus Surgery?  The Surgery involves making the natural openings of the sinuses larger by removing the bony partitions that separate the sinuses from the nasal cavity.  The natural sinus lining is preserved as much as possible to allow the sinuses to resume normal function after the surgery.  In some patients nasal polyps (excessively swollen lining of the sinuses) may be removed to relieve obstruction of the sinus openings.  The surgery is performed through the nose using lighted scopes, which eliminates the need for incisions on the face.  A septoplasty is a different procedure which is sometimes performed with sinus surgery.  It involves straightening the boy partition that separates the two sides of your nose.  A crooked or deviated septum may need repair if is obstructing the sinuses or nasal airflow.  Turbinate reduction is also often performed during sinus surgery.  The turbinates are bony proturberances from the side walls of the nose which swell and can obstruct the nose in patients with sinus and allergy problems.  Their size can be surgically reduced to help relieve nasal obstruction.  What Can Sinus Surgery Do For Me?  Sinus surgery can reduce the frequency of sinus infections requiring antibiotic treatment.  This can provide improvement in nasal congestion, post-nasal drainage, facial pressure and nasal obstruction.  Surgery will NOT prevent you from ever having an infection again, so it usually only for patients who get infections 4 or more times yearly requiring antibiotics, or for infections that do not clear with antibiotics.  It will not cure nasal allergies, so patients with allergies may still require medication to treat their allergies after surgery. Surgery may improve headaches related to sinusitis, however, some people will continue to  require medication to control sinus headaches related to allergies.  Surgery will do nothing for other forms of headache (migraine, tension or cluster).  What Are the Risks of Endoscopic Sinus Surgery?  Current techniques allow surgery to be performed safely with little risk, however, there are rare complications that patients should be aware of.  Because the sinuses are located around the eyes, there is risk of eye injury, including blindness, though again, this would be quite rare. This is usually a result of bleeding behind the eye during surgery, which can effect vision, though there are treatments to protect the vision and prevent permanent injury. More serious complications would include bleeding inside the brain cavity or damage to the brain.This happens when the fluid around the brain leaks out into the sinus cavity.  Again, all of these complications are uncommon, and spinal fluid leaks can be safely managed surgically if they occur.  The most common complication of sinus surgery is bleeding from the nose, which may require packing or cauterization of the nose.  Patients with polyps may experience recurrence of the polyps that would require revision surgery.  Alterations of sense of smell or injury to the tear ducts are also rare complications.   What is the Surgery Like, and what is the Recovery?  The Surgery usually takes a couple of hours to perform, and is usually performed under a general anesthetic (completely asleep).  Patients are usually discharged home after a couple of hours.  Sometimes during surgery it is necessary to pack the nose to control bleeding, and the packing is left in place for 24 - 48 hours, and removed by your surgeon.  If   a septoplasty was performed during the procedure, there is often a splint placed which must be removed after 5-7 days.   Discomfort: Pain is usually mild to moderate, and can be controlled by prescription pain medication or acetaminophen (Tylenol).   Aspirin, Ibuprofen (Advil, Motrin), or Naprosyn (Aleve) should be avoided, as they can cause increased bleeding.  Most patients feel sinus pressure like they have a bad head cold for several days.  Sleeping with your head elevated can help reduce swelling and facial pressure, as can ice packs over the face.  A humidifier may be helpful to keep the mucous and blood from drying in the nose.   Diet: There are no specific diet restrictions, however, you should generally start with clear liquids and a light diet of bland foods because the anesthetic can cause some nausea.  Advance your diet depending on how your stomach feels.  Taking your pain medication with food will often help reduce stomach upset which pain medications can cause.  Nasal Saline Irrigation: It is important to remove blood clots and dried mucous from the nose as it is healing.  This is done by having you irrigate the nose at least 3 - 4 times daily with a salt water solution.  We recommend using NeilMed Sinus Rinse (available at the drug store).  Fill the squeeze bottle with the solution, bend over a sink, and insert the tip of the squeeze bottle into the nose  of an inch.  Point the tip of the squeeze bottle towards the inside corner of the eye on the same side your irrigating.  Squeeze the bottle and gently irrigate the nose.  If you bend forward as you do this, most of the fluid will flow back out of the nose, instead of down your throat.   The solution should be warm, near body temperature, when you irrigate.   Each time you irrigate, you should use a full squeeze bottle.   Note that if you are instructed to use Nasal Steroid Sprays at any time after your surgery, irrigate with saline BEFORE using the steroid spray, so you do not wash it all out of the nose. Another product, Nasal Saline Gel (such as AYR Nasal Saline Gel) can be applied in each nostril 3 - 4 times daily to moisture the nose and reduce scabbing or crusting.  Bleeding:   Bloody drainage from the nose can be expected for several days, and patients are instructed to irrigate their nose frequently with salt water to help remove mucous and blood clots.  The drainage may be dark red or brown, though some fresh blood may be seen intermittently, especially after irrigation.  Do not blow you nose, as bleeding may occur. If you must sneeze, keep your mouth open to allow air to escape through your mouth.  If heavy bleeding occurs: Irrigate the nose with saline to rinse out clots, then spray the nose 3 - 4 times with Afrin Nasal Decongestant Spray.  The spray will constrict the blood vessels to slow bleeding.  Pinch the lower half of your nose shut to apply pressure, and lay down with your head elevated.  Ice packs over the nose may help as well. If bleeding persists despite these measures, you should notify your doctor.  Do not use the Afrin routinely to control nasal congestion after surgery, as it can result in worsening congestion and may affect healing.     Activity: Return to work varies among patients. Most patients will be out   of work at least 5 - 7 days to recover.  Patient may return to work after they are off of narcotic pain medication, and feeling well enough to perform the functions of their job.  Patients must avoid heavy lifting (over 10 pounds) or strenuous physical for 2 weeks after surgery, so your employer may need to assign you to light duty, or keep you out of work longer if light duty is not possible.  NOTE: you should not drive, operate dangerous machinery, do any mentally demanding tasks or make any important legal or financial decisions while on narcotic pain medication and recovering from the general anesthetic.    Call Your Doctor Immediately if You Have Any of the Following: Bleeding that you cannot control with the above measures Loss of vision, double vision, bulging of the eye or black eyes. Fever over 101 degrees Neck stiffness with severe headache,  fever, nausea and change in mental state. You are always encouraged to call anytime with concerns, however, please call with requests for pain medication refills during office hours.  Office Endoscopy: During follow-up visits your doctor will remove any packing or splints that may have been placed and evaluate and clean your sinuses endoscopically.  Topical anesthetic will be used to make this as comfortable as possible, though you may want to take your pain medication prior to the visit.  How often this will need to be done varies from patient to patient.  After complete recovery from the surgery, you may need follow-up endoscopy from time to time, particularly if there is concern of recurrent infection or nasal polyps.  

## 2022-06-10 ENCOUNTER — Encounter: Payer: Self-pay | Admitting: Otolaryngology

## 2022-06-14 ENCOUNTER — Other Ambulatory Visit: Payer: Self-pay

## 2022-06-14 ENCOUNTER — Encounter: Payer: Self-pay | Admitting: Otolaryngology

## 2022-06-14 ENCOUNTER — Encounter
Admission: RE | Disposition: A | Discharge status: Home / Self Care | Payer: Self-pay | Source: Home / Self Care | Attending: Otolaryngology

## 2022-06-14 ENCOUNTER — Ambulatory Visit
Admission: RE | Admit: 2022-06-14 | Discharge: 2022-06-14 | Disposition: A | Discharge status: Home / Self Care | Payer: Medicare Other | Attending: Otolaryngology | Admitting: Otolaryngology

## 2022-06-14 ENCOUNTER — Ambulatory Visit: Payer: Medicare Other | Admitting: Anesthesiology

## 2022-06-14 DIAGNOSIS — I10 Essential (primary) hypertension: Secondary | ICD-10-CM | POA: Insufficient documentation

## 2022-06-14 DIAGNOSIS — Z8673 Personal history of transient ischemic attack (TIA), and cerebral infarction without residual deficits: Secondary | ICD-10-CM | POA: Insufficient documentation

## 2022-06-14 DIAGNOSIS — F419 Anxiety disorder, unspecified: Secondary | ICD-10-CM | POA: Insufficient documentation

## 2022-06-14 DIAGNOSIS — J342 Deviated nasal septum: Secondary | ICD-10-CM | POA: Insufficient documentation

## 2022-06-14 DIAGNOSIS — M797 Fibromyalgia: Secondary | ICD-10-CM | POA: Diagnosis not present

## 2022-06-14 DIAGNOSIS — J343 Hypertrophy of nasal turbinates: Secondary | ICD-10-CM | POA: Diagnosis not present

## 2022-06-14 DIAGNOSIS — D649 Anemia, unspecified: Secondary | ICD-10-CM | POA: Diagnosis not present

## 2022-06-14 DIAGNOSIS — Z9889 Other specified postprocedural states: Secondary | ICD-10-CM

## 2022-06-14 HISTORY — PX: SEPTOPLASTY: SHX2393

## 2022-06-14 HISTORY — PX: NASAL TURBINATE REDUCTION: SHX2072

## 2022-06-14 SURGERY — SEPTOPLASTY, NOSE
Anesthesia: General | Site: Nose

## 2022-06-14 MED ORDER — LACTATED RINGERS IV SOLN: INTRAVENOUS | Status: DC

## 2022-06-14 MED ORDER — DEXAMETHASONE SODIUM PHOSPHATE 4 MG/ML IJ SOLN
INTRAMUSCULAR | Status: DC | PRN
  Administered 2022-06-14: 8 mg via INTRAVENOUS

## 2022-06-14 MED ORDER — OXYCODONE HCL 5 MG PO TABS: 5.0000 mg | ORAL_TABLET | Freq: Once | ORAL | Status: AC | PRN

## 2022-06-14 MED ORDER — ACETAMINOPHEN 10 MG/ML IV SOLN
1000.0000 mg | Freq: Once | INTRAVENOUS | Status: AC
  Administered 2022-06-14: 1000 mg via INTRAVENOUS

## 2022-06-14 MED ORDER — OXYCODONE HCL 5 MG/5ML PO SOLN
5.0000 mg | Freq: Once | ORAL | Status: AC | PRN
  Administered 2022-06-14: 5 mg via ORAL

## 2022-06-14 MED ORDER — PHENYLEPHRINE HCL 0.5 % NA SOLN
NASAL | Status: DC | PRN
  Administered 2022-06-14: 15 mL via TOPICAL

## 2022-06-14 MED ORDER — FENTANYL CITRATE (PF) 100 MCG/2ML IJ SOLN
INTRAMUSCULAR | Status: DC | PRN
  Administered 2022-06-14: 100 ug via INTRAVENOUS

## 2022-06-14 MED ORDER — FENTANYL CITRATE PF 50 MCG/ML IJ SOSY
25.0000 ug | PREFILLED_SYRINGE | INTRAMUSCULAR | Status: DC | PRN
  Administered 2022-06-14 (×2): 50 ug via INTRAVENOUS

## 2022-06-14 MED ORDER — SCOPOLAMINE 1 MG/3DAYS TD PT72
1.0000 | MEDICATED_PATCH | TRANSDERMAL | Status: DC
  Administered 2022-06-14: 1.5 mg via TRANSDERMAL

## 2022-06-14 MED ORDER — CEPHALEXIN 500 MG PO CAPS: 500.0000 mg | ORAL_CAPSULE | Freq: Two times a day (BID) | ORAL | 0 refills | Status: DC

## 2022-06-14 MED ORDER — SUCCINYLCHOLINE CHLORIDE 200 MG/10ML IV SOSY
PREFILLED_SYRINGE | INTRAVENOUS | Status: DC | PRN
  Administered 2022-06-14: 100 mg via INTRAVENOUS

## 2022-06-14 MED ORDER — PROMETHAZINE HCL 25 MG/ML IJ SOLN: 6.2500 mg | INTRAMUSCULAR | Status: DC | PRN

## 2022-06-14 MED ORDER — OXYMETAZOLINE HCL 0.05 % NA SOLN
2.0000 | Freq: Once | NASAL | Status: AC
  Administered 2022-06-14: 2 via NASAL

## 2022-06-14 MED ORDER — DEXTROSE 5 % IV SOLN
2000.0000 mg | Freq: Once | INTRAVENOUS | Status: AC
  Administered 2022-06-14: 2000 mg via INTRAVENOUS

## 2022-06-14 MED ORDER — HYDROCODONE-ACETAMINOPHEN 5-325 MG PO TABS: 1.0000 | ORAL_TABLET | Freq: Four times a day (QID) | ORAL | 0 refills | Status: AC | PRN

## 2022-06-14 MED ORDER — PREDNISONE 10 MG PO TABS: ORAL_TABLET | ORAL | 0 refills | Status: DC

## 2022-06-14 MED ORDER — LIDOCAINE-EPINEPHRINE 1 %-1:100000 IJ SOLN
INTRAMUSCULAR | Status: DC | PRN
  Administered 2022-06-14: 5 mL

## 2022-06-14 MED ORDER — LIDOCAINE HCL (CARDIAC) PF 100 MG/5ML IV SOSY
PREFILLED_SYRINGE | INTRAVENOUS | Status: DC | PRN
  Administered 2022-06-14: 80 mg via INTRAVENOUS

## 2022-06-14 MED ORDER — PROPOFOL 10 MG/ML IV BOLUS
INTRAVENOUS | Status: DC | PRN
  Administered 2022-06-14: 200 mg via INTRAVENOUS
  Administered 2022-06-14: 200 ug/kg/min via INTRAVENOUS

## 2022-06-14 MED ORDER — MEPERIDINE HCL 25 MG/ML IJ SOLN: 6.2500 mg | INTRAMUSCULAR | Status: DC | PRN

## 2022-06-14 MED ORDER — MIDAZOLAM HCL 5 MG/5ML IJ SOLN
INTRAMUSCULAR | Status: DC | PRN
  Administered 2022-06-14: 2 mg via INTRAVENOUS

## 2022-06-14 SURGICAL SUPPLY — 26 items
CANISTER SUCT 1200ML W/VALVE (MISCELLANEOUS) ×3 IMPLANT
COAGULATOR SUCT 8FR VV (MISCELLANEOUS) ×3 IMPLANT
ELECT REM PT RETURN 9FT ADLT (ELECTROSURGICAL) ×3
ELECTRODE REM PT RTRN 9FT ADLT (ELECTROSURGICAL) ×2 IMPLANT
GLOVE SURG GAMMEX PI TX LF 7.5 (GLOVE) ×6 IMPLANT
GOWN STRL REUS W/ TWL LRG LVL3 (GOWN DISPOSABLE) ×2 IMPLANT
GOWN STRL REUS W/TWL LRG LVL3 (GOWN DISPOSABLE) ×3
KIT TURNOVER KIT A (KITS) ×3 IMPLANT
NDL HYPO 27GX1-1/4 (NEEDLE) ×2 IMPLANT
NEEDLE HYPO 27GX1-1/4 (NEEDLE) ×3 IMPLANT
NS IRRIG 500ML POUR BTL (IV SOLUTION) ×3 IMPLANT
PACK ENT CUSTOM (PACKS) ×3 IMPLANT
PACKING NASAL EPIS 4X2.4 XEROG (MISCELLANEOUS) ×3 IMPLANT
PATTIES SURGICAL .5 X3 (DISPOSABLE) ×3 IMPLANT
SOL ANTI-FOG 6CC FOG-OUT (MISCELLANEOUS) ×2 IMPLANT
SOL FOG-OUT ANTI-FOG 6CC (MISCELLANEOUS) ×1
SPLINT NASAL SEPTAL BLV .50 ST (MISCELLANEOUS) ×3 IMPLANT
STRAP BODY AND KNEE 60X3 (MISCELLANEOUS) ×3 IMPLANT
SUT CHROMIC 3-0 (SUTURE) ×3
SUT CHROMIC 3-0 KS 27XMFL CR (SUTURE) ×2
SUT ETHILON 3-0 KS 30 BLK (SUTURE) ×3 IMPLANT
SUT PLAIN GUT 4-0 (SUTURE) ×3 IMPLANT
SUTURE CHRMC 3-0 KS 27XMFL CR (SUTURE) IMPLANT
SYR 3ML LL SCALE MARK (SYRINGE) ×3 IMPLANT
TOWEL OR 17X26 4PK STRL BLUE (TOWEL DISPOSABLE) ×4 IMPLANT
WATER STERILE IRR 250ML POUR (IV SOLUTION) ×4 IMPLANT

## 2022-06-14 NOTE — Anesthesia Procedure Notes (Signed)
Procedure Name: Intubation Date/Time: 06/14/2022 7:48 AM  Performed by: Silvana Newness, CRNAPre-anesthesia Checklist: Patient identified, Emergency Drugs available, Suction available, Patient being monitored and Timeout performed Patient Re-evaluated:Patient Re-evaluated prior to induction Oxygen Delivery Method: Circle system utilized Preoxygenation: Pre-oxygenation with 100% oxygen Induction Type: IV induction Ventilation: Mask ventilation without difficulty Laryngoscope Size: Mac and 3 Grade View: Grade II Tube type: Oral Rae Tube size: 7.0 mm Number of attempts: 1 Airway Equipment and Method: Stylet Placement Confirmation: ETT inserted through vocal cords under direct vision, positive ETCO2 and breath sounds checked- equal and bilateral Secured at: 21 cm Tube secured with: Tape Dental Injury: Teeth and Oropharynx as per pre-operative assessment

## 2022-06-14 NOTE — H&P (Signed)
H&P has been reviewed and patient reevaluated, no changes necessary. To be downloaded later.  

## 2022-06-14 NOTE — Anesthesia Postprocedure Evaluation (Signed)
Anesthesia Post Note  Patient: Victoria Guzman  Procedure(s) Performed: SEPTOPLASTY (Nose) INFERIOR TURBINATE REDUCTION (Bilateral: Nose)     Patient location during evaluation: PACU Anesthesia Type: General Level of consciousness: awake and alert Pain management: pain level controlled Vital Signs Assessment: post-procedure vital signs reviewed and stable Respiratory status: spontaneous breathing, nonlabored ventilation, respiratory function stable and patient connected to nasal cannula oxygen Cardiovascular status: blood pressure returned to baseline and stable Postop Assessment: no apparent nausea or vomiting Anesthetic complications: no   No notable events documented.  Zerick Prevette A  Ruther Ephraim

## 2022-06-14 NOTE — Op Note (Signed)
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  There is a large organized clot on the left side of the quadrangular plate but the right side did not have a clot.  The ethmoid plate was buckled to the left side some in the vomer was partially to the left.  The inferior turbinates were enlarged.  Procedure: With the patient in a comfortable supine position,  general orotracheal anesthesia was induced without difficulty.     The patient received preoperative Afrin spray for topical decongestion and vasoconstriction.  Intravenous prophylactic antibiotics were administered.  At an appropriate level, the patient was placed in a semi-sitting position.  Nasal vibrissae were trimmed.   1% Xylocaine with 1:100,000 epinephrine, 5 cc's, was infiltrated into the anterior floor of the nose, into the nasal spine region, into the membranous columella, and finally into the submucoperichondrial plane of the septum on both sides.  Several minutes were allowed for this to take effect.  Cottoniod pledgetts soaked in Afrin and 4% Xylocaine were placed into both nasal cavities and left while the patient was prepped and draped in the standard fashion.  The materials were removed from the nose and observed to be intact and correct in number.  The nose was inspected with a headlight and zero degree scope with the findings as described above.  A left hemitransfixion incision was sharply executed and carried down to the quadrangular cartilage. The mucoperichondrium was elelvated along the quadrangular plate back to the bony-cartilaginous junction on  both sides. The mucoperiostium was then elevated along the ethmoid plate and the vomer.  There is a large organized clot on the left side between the mucoperiosteum and the cartilage.  This was removed to allow the mucoperichondrium to get back in contact with the septal cartilage.  The boney-catilaginous junction was then split with a freer elevator and the mucoperiosteum was elevated on the opposite side. The mucoperiosteum was then elevated along the maxillary crest as needed to expose the crooked bone of the crest.  Boney spurs of the vomer and maxillary crest were removed with Lenoria Chime forceps.  The cartilaginous plate was trimmed along its posterior and inferior borders of about 2 mm of cartilage to free it up inferiorly.  The cartilage was overhanging on the left side and that was removed.  Some of the deviated ethmoid plate was then fractured and removed with Takahashi forceps to free up the posterior border of the quadrangular plate and allow it to swing back to the midline. The mucosal flaps were placed back into their anatomic position to allow visualization of the airways. The septum now sat in the midline with an improved airway.  A 3-0 Chromic suture on a Keith needle in used to anchor the inferior septum at the nasal spine with a through and through suture. The mucosal flaps are then sutured together using a through and through whip stitch of 4-0 Plain Gut with a mini-Keith needle. This was used to close the [place] incision as well.   The inferior turbinates were then inspected. An incision was created along the inferior aspect of the left inferior turbinate with  removal of some of the inferior soft tissue and bone. Electrocautery was used to control bleeding in the area. The remaining turbinate was then outfractured to open up the airway further. There was no significant bleeding noted. The right turbinate was then trimmed and outfractured in a similar fashion.  The airways were then  visualized and showed open passageways on both sides that were significantly improved compared to before surgery. There was no signifcant bleeding. Nasal splints were applied to both sides of the septum using Xomed 0.70mm regular sized splints that were trimmed, and then held in position with a 3-0 Nylon through and through suture.  The patient was turned back over to anesthesia, and awakened, extubated, and taken to the PACU in satisfactory condition.  Dispo:   PACU to home  Plan: Ice, elevation, narcotic analgesia, steroid taper, and prophylactic antibiotics for the duration of indwelling nasal foreign bodies.  We will reevaluate the patient in the office in 6 days and remove the septal splints.  Return to work in 10 days, strenuous activities in two weeks.   Victoria Guzman 06/14/2022 8:47 AM

## 2022-06-14 NOTE — Transfer of Care (Signed)
Immediate Anesthesia Transfer of Care Note  Patient: Victoria Guzman  Procedure(s) Performed: SEPTOPLASTY (Nose) INFERIOR TURBINATE REDUCTION (Bilateral: Nose)  Patient Location: PACU  Anesthesia Type: General  Level of Consciousness: awake, alert  and patient cooperative  Airway and Oxygen Therapy: Patient Spontanous Breathing and Patient connected to supplemental oxygen  Post-op Assessment: Post-op Vital signs reviewed, Patient's Cardiovascular Status Stable, Respiratory Function Stable, Patent Airway and No signs of Nausea or vomiting  Post-op Vital Signs: Reviewed and stable  Complications: No notable events documented.

## 2022-06-14 NOTE — Anesthesia Preprocedure Evaluation (Signed)
Anesthesia Evaluation  Patient identified by MRN, date of birth, ID band Patient awake    Reviewed: Allergy & Precautions, NPO status , Patient's Chart, lab work & pertinent test results, reviewed documented beta blocker date and time   History of Anesthesia Complications Negative for: history of anesthetic complications  Airway Mallampati: III  TM Distance: >3 FB Neck ROM: Full    Dental  (+)    Pulmonary    breath sounds clear to auscultation       Cardiovascular Exercise Tolerance: Poor hypertension, (-) angina+ DOE (Chronic, stable)   Rhythm:Regular Rate:Normal     Neuro/Psych PSYCHIATRIC DISORDERS Anxiety Depression  Fibromyalgia TIACVA    GI/Hepatic neg GERD  ,  Endo/Other    Renal/GU      Musculoskeletal  (+) Fibromyalgia -  Abdominal   Peds  Hematology  (+) Blood dyscrasia, anemia ,   Anesthesia Other Findings   Reproductive/Obstetrics                             Anesthesia Physical Anesthesia Plan  ASA: 2  Anesthesia Plan: General   Post-op Pain Management:    Induction: Intravenous  PONV Risk Score and Plan: 4 or greater and Treatment may vary due to age or medical condition, Dexamethasone, Midazolam, Scopolamine patch - Pre-op and TIVA  Airway Management Planned: Oral ETT  Additional Equipment:   Intra-op Plan:   Post-operative Plan: Extubation in OR  Informed Consent: I have reviewed the patients History and Physical, chart, labs and discussed the procedure including the risks, benefits and alternatives for the proposed anesthesia with the patient or authorized representative who has indicated his/her understanding and acceptance.       Plan Discussed with: CRNA and Anesthesiologist  Anesthesia Plan Comments:         Anesthesia Quick Evaluation

## 2022-06-15 ENCOUNTER — Encounter: Payer: Self-pay | Admitting: Otolaryngology

## 2022-08-31 ENCOUNTER — Other Ambulatory Visit: Payer: Self-pay | Admitting: Obstetrics and Gynecology

## 2022-08-31 DIAGNOSIS — Z1231 Encounter for screening mammogram for malignant neoplasm of breast: Secondary | ICD-10-CM

## 2022-09-28 ENCOUNTER — Ambulatory Visit
Admission: RE | Admit: 2022-09-28 | Discharge: 2022-09-28 | Disposition: A | Discharge status: Home / Self Care | Payer: Medicare Other | Source: Ambulatory Visit | Attending: Obstetrics and Gynecology | Admitting: Obstetrics and Gynecology

## 2022-09-28 DIAGNOSIS — Z1231 Encounter for screening mammogram for malignant neoplasm of breast: Secondary | ICD-10-CM | POA: Diagnosis present

## 2022-11-13 ENCOUNTER — Inpatient Hospital Stay: Payer: Medicare Other

## 2022-11-13 ENCOUNTER — Inpatient Hospital Stay
Admission: EM | Admit: 2022-11-13 | Discharge: 2022-11-17 | DRG: 193 | Disposition: A | Discharge status: Home / Self Care | Payer: Medicare Other | Attending: Internal Medicine | Admitting: Internal Medicine

## 2022-11-13 ENCOUNTER — Emergency Department: Payer: Medicare Other

## 2022-11-13 ENCOUNTER — Other Ambulatory Visit: Payer: Self-pay

## 2022-11-13 ENCOUNTER — Encounter: Payer: Self-pay | Admitting: Emergency Medicine

## 2022-11-13 DIAGNOSIS — I10 Essential (primary) hypertension: Secondary | ICD-10-CM | POA: Diagnosis present

## 2022-11-13 DIAGNOSIS — Z807 Family history of other malignant neoplasms of lymphoid, hematopoietic and related tissues: Secondary | ICD-10-CM | POA: Diagnosis not present

## 2022-11-13 DIAGNOSIS — N179 Acute kidney failure, unspecified: Secondary | ICD-10-CM | POA: Diagnosis present

## 2022-11-13 DIAGNOSIS — F1120 Opioid dependence, uncomplicated: Secondary | ICD-10-CM | POA: Diagnosis present

## 2022-11-13 DIAGNOSIS — Z79899 Other long term (current) drug therapy: Secondary | ICD-10-CM

## 2022-11-13 DIAGNOSIS — N1831 Chronic kidney disease, stage 3a: Secondary | ICD-10-CM

## 2022-11-13 DIAGNOSIS — G894 Chronic pain syndrome: Secondary | ICD-10-CM | POA: Diagnosis present

## 2022-11-13 DIAGNOSIS — B370 Candidal stomatitis: Secondary | ICD-10-CM | POA: Diagnosis not present

## 2022-11-13 DIAGNOSIS — Z8249 Family history of ischemic heart disease and other diseases of the circulatory system: Secondary | ICD-10-CM | POA: Diagnosis not present

## 2022-11-13 DIAGNOSIS — Z803 Family history of malignant neoplasm of breast: Secondary | ICD-10-CM

## 2022-11-13 DIAGNOSIS — Z1152 Encounter for screening for COVID-19: Secondary | ICD-10-CM | POA: Diagnosis not present

## 2022-11-13 DIAGNOSIS — J101 Influenza due to other identified influenza virus with other respiratory manifestations: Principal | ICD-10-CM

## 2022-11-13 DIAGNOSIS — I2489 Other forms of acute ischemic heart disease: Secondary | ICD-10-CM | POA: Diagnosis present

## 2022-11-13 DIAGNOSIS — F32A Depression, unspecified: Secondary | ICD-10-CM | POA: Diagnosis present

## 2022-11-13 DIAGNOSIS — Z888 Allergy status to other drugs, medicaments and biological substances status: Secondary | ICD-10-CM | POA: Diagnosis not present

## 2022-11-13 DIAGNOSIS — I129 Hypertensive chronic kidney disease with stage 1 through stage 4 chronic kidney disease, or unspecified chronic kidney disease: Secondary | ICD-10-CM | POA: Diagnosis present

## 2022-11-13 DIAGNOSIS — Z833 Family history of diabetes mellitus: Secondary | ICD-10-CM | POA: Diagnosis not present

## 2022-11-13 DIAGNOSIS — Z841 Family history of disorders of kidney and ureter: Secondary | ICD-10-CM | POA: Diagnosis not present

## 2022-11-13 DIAGNOSIS — M797 Fibromyalgia: Secondary | ICD-10-CM | POA: Diagnosis present

## 2022-11-13 DIAGNOSIS — E876 Hypokalemia: Secondary | ICD-10-CM | POA: Diagnosis present

## 2022-11-13 DIAGNOSIS — R7989 Other specified abnormal findings of blood chemistry: Secondary | ICD-10-CM | POA: Insufficient documentation

## 2022-11-13 DIAGNOSIS — Z8673 Personal history of transient ischemic attack (TIA), and cerebral infarction without residual deficits: Secondary | ICD-10-CM | POA: Diagnosis not present

## 2022-11-13 DIAGNOSIS — J209 Acute bronchitis, unspecified: Secondary | ICD-10-CM | POA: Diagnosis present

## 2022-11-13 DIAGNOSIS — T502X5A Adverse effect of carbonic-anhydrase inhibitors, benzothiadiazides and other diuretics, initial encounter: Secondary | ICD-10-CM | POA: Diagnosis present

## 2022-11-13 DIAGNOSIS — Z823 Family history of stroke: Secondary | ICD-10-CM

## 2022-11-13 DIAGNOSIS — F419 Anxiety disorder, unspecified: Secondary | ICD-10-CM | POA: Diagnosis present

## 2022-11-13 DIAGNOSIS — J9601 Acute respiratory failure with hypoxia: Secondary | ICD-10-CM | POA: Diagnosis present

## 2022-11-13 LAB — BLOOD GAS, VENOUS
Acid-Base Excess: 10.4 mmol/L — ABNORMAL HIGH (ref 0.0–2.0)
Bicarbonate: 37.4 mmol/L — ABNORMAL HIGH (ref 20.0–28.0)
Delivery systems: POSITIVE
FIO2: 40 %
O2 Saturation: 98.1 %
Patient temperature: 37
pCO2, Ven: 59 mmHg (ref 44–60)
pH, Ven: 7.41 (ref 7.25–7.43)
pO2, Ven: 91 mmHg — ABNORMAL HIGH (ref 32–45)

## 2022-11-13 LAB — BASIC METABOLIC PANEL
Anion gap: 12 (ref 5–15)
BUN: 23 mg/dL — ABNORMAL HIGH (ref 6–20)
CO2: 26 mmol/L (ref 22–32)
Calcium: 9.2 mg/dL (ref 8.9–10.3)
Chloride: 101 mmol/L (ref 98–111)
Creatinine, Ser: 1.3 mg/dL — ABNORMAL HIGH (ref 0.44–1.00)
GFR, Estimated: 49 mL/min — ABNORMAL LOW (ref >60)
Glucose, Bld: 158 mg/dL — ABNORMAL HIGH (ref 70–99)
Potassium: 3.3 mmol/L — ABNORMAL LOW (ref 3.5–5.1)
Sodium: 139 mmol/L (ref 135–145)

## 2022-11-13 LAB — MRSA NEXT GEN BY PCR, NASAL: MRSA by PCR Next Gen: NOT DETECTED

## 2022-11-13 LAB — CBC
HCT: 43.3 % (ref 36.0–46.0)
Hemoglobin: 14 g/dL (ref 12.0–15.0)
MCH: 27.5 pg (ref 26.0–34.0)
MCHC: 32.3 g/dL (ref 30.0–36.0)
MCV: 85.1 fL (ref 80.0–100.0)
Platelets: 258 10*3/uL (ref 150–400)
RBC: 5.09 MIL/uL (ref 3.87–5.11)
RDW: 14.9 % (ref 11.5–15.5)
WBC: 7.5 10*3/uL (ref 4.0–10.5)
nRBC: 0 % (ref 0.0–0.2)

## 2022-11-13 LAB — PROCALCITONIN: Procalcitonin: 0.1 ng/mL

## 2022-11-13 LAB — PROTIME-INR
INR: 1 (ref 0.8–1.2)
Prothrombin Time: 12.9 seconds (ref 11.4–15.2)

## 2022-11-13 LAB — LACTIC ACID, PLASMA
Lactic Acid, Venous: 1.5 mmol/L (ref 0.5–1.9)
Lactic Acid, Venous: 0.8 mmol/L (ref 0.5–1.9)

## 2022-11-13 LAB — RESP PANEL BY RT-PCR (FLU A&B, COVID) ARPGX2
Influenza A by PCR: POSITIVE — AB
Influenza B by PCR: NEGATIVE
SARS Coronavirus 2 by RT PCR: NEGATIVE

## 2022-11-13 LAB — TROPONIN I (HIGH SENSITIVITY)
Troponin I (High Sensitivity): 21 ng/L — ABNORMAL HIGH (ref <18)
Troponin I (High Sensitivity): 60 ng/L — ABNORMAL HIGH (ref <18)
Troponin I (High Sensitivity): 41 ng/L — ABNORMAL HIGH (ref <18)

## 2022-11-13 LAB — BRAIN NATRIURETIC PEPTIDE: B Natriuretic Peptide: 185 pg/mL — ABNORMAL HIGH (ref 0.0–100.0)

## 2022-11-13 LAB — GLUCOSE, CAPILLARY: Glucose-Capillary: 96 mg/dL (ref 70–99)

## 2022-11-13 MED ORDER — LABETALOL HCL 5 MG/ML IV SOLN
5.0000 mg | INTRAVENOUS | Status: DC | PRN
  Administered 2022-11-16: 5 mg via INTRAVENOUS
  Filled 2022-11-13 (×2): qty 4

## 2022-11-13 MED ORDER — ALPRAZOLAM 0.5 MG PO TABS
0.5000 mg | ORAL_TABLET | Freq: Two times a day (BID) | ORAL | Status: DC | PRN
  Administered 2022-11-15 – 2022-11-16 (×3): 0.5 mg via ORAL
  Filled 2022-11-13 (×3): qty 1

## 2022-11-13 MED ORDER — TIZANIDINE HCL 2 MG PO TABS
4.0000 mg | ORAL_TABLET | Freq: Three times a day (TID) | ORAL | Status: DC
  Administered 2022-11-13 – 2022-11-17 (×9): 4 mg via ORAL
  Filled 2022-11-13 (×14): qty 2

## 2022-11-13 MED ORDER — BUDESONIDE 0.5 MG/2ML IN SUSP
0.5000 mg | Freq: Two times a day (BID) | RESPIRATORY_TRACT | Status: DC
  Administered 2022-11-13 – 2022-11-17 (×8): 0.5 mg via RESPIRATORY_TRACT
  Filled 2022-11-13 (×8): qty 2

## 2022-11-13 MED ORDER — ACETAMINOPHEN 650 MG RE SUPP: 650.0000 mg | Freq: Four times a day (QID) | RECTAL | Status: DC | PRN

## 2022-11-13 MED ORDER — IOHEXOL 350 MG/ML SOLN
75.0000 mL | Freq: Once | INTRAVENOUS | Status: AC | PRN
  Administered 2022-11-13: 75 mL via INTRAVENOUS

## 2022-11-13 MED ORDER — LACTATED RINGERS IV BOLUS (SEPSIS)
1000.0000 mL | Freq: Once | INTRAVENOUS | Status: AC
  Administered 2022-11-13: 1000 mL via INTRAVENOUS

## 2022-11-13 MED ORDER — MAGNESIUM SULFATE 2 GM/50ML IV SOLN
2.0000 g | Freq: Once | INTRAVENOUS | Status: AC
  Administered 2022-11-13: 2 g via INTRAVENOUS
  Filled 2022-11-13: qty 50

## 2022-11-13 MED ORDER — IPRATROPIUM-ALBUTEROL 0.5-2.5 (3) MG/3ML IN SOLN
3.0000 mL | RESPIRATORY_TRACT | Status: DC
  Administered 2022-11-13 – 2022-11-14 (×5): 3 mL via RESPIRATORY_TRACT
  Filled 2022-11-13 (×5): qty 3

## 2022-11-13 MED ORDER — ONDANSETRON HCL 4 MG PO TABS: 4.0000 mg | ORAL_TABLET | Freq: Four times a day (QID) | ORAL | Status: DC | PRN

## 2022-11-13 MED ORDER — METHYLPREDNISOLONE SODIUM SUCC 40 MG IJ SOLR
40.0000 mg | Freq: Two times a day (BID) | INTRAMUSCULAR | Status: DC
  Administered 2022-11-13 – 2022-11-14 (×2): 40 mg via INTRAVENOUS
  Filled 2022-11-13 (×2): qty 1

## 2022-11-13 MED ORDER — LORAZEPAM 2 MG/ML IJ SOLN
INTRAMUSCULAR | Status: AC
  Administered 2022-11-13: 0.5 mg
  Filled 2022-11-13: qty 1

## 2022-11-13 MED ORDER — OXYCODONE HCL 5 MG PO TABS
5.0000 mg | ORAL_TABLET | ORAL | Status: AC | PRN
  Administered 2022-11-13 – 2022-11-14 (×2): 5 mg via ORAL
  Filled 2022-11-13 (×2): qty 1

## 2022-11-13 MED ORDER — MORPHINE SULFATE (PF) 2 MG/ML IV SOLN
2.0000 mg | INTRAVENOUS | Status: DC | PRN
  Administered 2022-11-13: 2 mg via INTRAVENOUS
  Filled 2022-11-13: qty 1

## 2022-11-13 MED ORDER — LISINOPRIL-HYDROCHLOROTHIAZIDE 20-12.5 MG PO TABS: 1.0000 | ORAL_TABLET | Freq: Two times a day (BID) | ORAL | Status: DC

## 2022-11-13 MED ORDER — ENOXAPARIN SODIUM 40 MG/0.4ML IJ SOSY
40.0000 mg | PREFILLED_SYRINGE | INTRAMUSCULAR | Status: DC
  Administered 2022-11-13 – 2022-11-14 (×2): 40 mg via SUBCUTANEOUS
  Filled 2022-11-13 (×5): qty 0.4

## 2022-11-13 MED ORDER — HYDROCODONE-ACETAMINOPHEN 5-325 MG PO TABS
1.0000 | ORAL_TABLET | ORAL | Status: DC | PRN
  Administered 2022-11-13: 1 via ORAL
  Filled 2022-11-13: qty 1

## 2022-11-13 MED ORDER — CHLORHEXIDINE GLUCONATE CLOTH 2 % EX PADS
6.0000 | MEDICATED_PAD | Freq: Every day | CUTANEOUS | Status: DC
  Administered 2022-11-15 – 2022-11-17 (×2): 6 via TOPICAL

## 2022-11-13 MED ORDER — LORAZEPAM 2 MG/ML IJ SOLN: 0.5000 mg | Freq: Once | INTRAMUSCULAR | Status: AC

## 2022-11-13 MED ORDER — POTASSIUM CHLORIDE CRYS ER 20 MEQ PO TBCR
40.0000 meq | EXTENDED_RELEASE_TABLET | Freq: Once | ORAL | Status: AC
  Administered 2022-11-13: 40 meq via ORAL
  Filled 2022-11-13: qty 2

## 2022-11-13 MED ORDER — ZOLPIDEM TARTRATE 5 MG PO TABS: 5.0000 mg | ORAL_TABLET | Freq: Every day | ORAL | Status: DC

## 2022-11-13 MED ORDER — ALBUTEROL SULFATE (2.5 MG/3ML) 0.083% IN NEBU
2.5000 mg | INHALATION_SOLUTION | RESPIRATORY_TRACT | Status: DC | PRN
  Administered 2022-11-13 (×3): 2.5 mg via RESPIRATORY_TRACT
  Filled 2022-11-13 (×3): qty 3

## 2022-11-13 MED ORDER — ACETAMINOPHEN 325 MG PO TABS: 650.0000 mg | ORAL_TABLET | Freq: Four times a day (QID) | ORAL | Status: DC | PRN

## 2022-11-13 MED ORDER — OSELTAMIVIR PHOSPHATE 30 MG PO CAPS
30.0000 mg | ORAL_CAPSULE | Freq: Two times a day (BID) | ORAL | Status: DC
  Administered 2022-11-13 – 2022-11-17 (×9): 30 mg via ORAL
  Filled 2022-11-13 (×10): qty 1

## 2022-11-13 MED ORDER — ACETAMINOPHEN 10 MG/ML IV SOLN
1000.0000 mg | Freq: Four times a day (QID) | INTRAVENOUS | Status: AC
  Administered 2022-11-13 – 2022-11-14 (×4): 1000 mg via INTRAVENOUS
  Filled 2022-11-13 (×4): qty 100

## 2022-11-13 MED ORDER — HYDROCHLOROTHIAZIDE 12.5 MG PO TABS
12.5000 mg | ORAL_TABLET | Freq: Every day | ORAL | Status: DC
  Administered 2022-11-13 – 2022-11-14 (×2): 12.5 mg via ORAL
  Filled 2022-11-13 (×2): qty 1

## 2022-11-13 MED ORDER — FUROSEMIDE 10 MG/ML IJ SOLN
40.0000 mg | Freq: Once | INTRAMUSCULAR | Status: AC
  Administered 2022-11-13: 40 mg via INTRAVENOUS
  Filled 2022-11-13: qty 4

## 2022-11-13 MED ORDER — CLONAZEPAM 1 MG PO TABS
1.0000 mg | ORAL_TABLET | Freq: Two times a day (BID) | ORAL | Status: DC
  Administered 2022-11-13 – 2022-11-17 (×8): 1 mg via ORAL
  Filled 2022-11-13 (×4): qty 1
  Filled 2022-11-13 (×2): qty 2
  Filled 2022-11-13 (×3): qty 1
  Filled 2022-11-13: qty 2

## 2022-11-13 MED ORDER — LISINOPRIL 5 MG PO TABS
20.0000 mg | ORAL_TABLET | Freq: Every day | ORAL | Status: DC
  Administered 2022-11-13: 20 mg via ORAL
  Filled 2022-11-13: qty 2

## 2022-11-13 MED ORDER — ONDANSETRON HCL 4 MG/2ML IJ SOLN: 4.0000 mg | Freq: Four times a day (QID) | INTRAMUSCULAR | Status: DC | PRN

## 2022-11-13 MED ORDER — SODIUM CHLORIDE 0.9 % IV SOLN: INTRAVENOUS | Status: AC

## 2022-11-13 NOTE — Assessment & Plan Note (Signed)
Acute respiratory failure with hypoxia O2 sat 88% on room air impression tachypneic and tachycardic CTA chest with no acute findings Tamiflu twice daily for 5 days Antitussives, IV fluids, supportive care Supplemental oxygen to keep sats over 94%

## 2022-11-13 NOTE — Progress Notes (Signed)
Pt arrived to room 132 via bed from the ED. Received report from Leotis Shames, RN. See assessment. Will continue to monitor.
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
## 2022-11-13 NOTE — Progress Notes (Signed)
Pt transferred to ICU 19 with all belongings via bed by this RN and Bambi, RT with BiPAP. Bedside report given to Irving Burton, RN. All questions answered to satisfaction.

## 2022-11-13 NOTE — Progress Notes (Signed)
RT called for pt desaturation into low 90's high 80's. Pt placed on 10 HFNC with increase in O2 sats in mid 90's

## 2022-11-13 NOTE — Consult Note (Incomplete)
NAME:  Victoria Guzman, MRN:  [id number], DOB:  1966/07/26, LOS: 0 ADMISSION DATE:  11/13/2022, CONSULTATION DATE:  11/13/2022 REFERRING MD:  Lorella Nimrod  CHIEF COMPLAINT:  SOB   HPI  56 y.o female with significant PMH of HTN, CKD stage II-III, CVA, fibromyalgia, chronic pain on chronic opiates, anxiety and depression who presented to the ED with chief complaints of progressive shortness of breath x 2 days.   ED Course: Initial vital signs showed HR of 127 beats/minute, BP 166/92 mm Hg, the RR 26 breaths/minute, and the oxygen saturation 88 % on RA and a temperature of 98.65F (36.8C). Patient was in moderate distress.  Pertinent Labs/Diagnostics Findings: Chemistry:Na+/ K+: 139/3.3 glucose: 158 BUN/Cr.: 23/1.3 CBC: Unremarkable Other Lab findings:   PCT: 0.10 Lactic acid:  COVID PCR: Negative.  Influenza A positive.  A Imaging:  CXR> no acute cardiopulmonary process CTA Chest> no evidence of PE or acute abnormality Patient received IV fluids and was admitted to hospitalist service.  Hospital Course: Overnight patient had oxygen desaturation into the low 90s and high 80s.  Placed on 10 HF [place] with some improvement however she remained tachycardic and tachypneic with elevated blood pressure.  Treated with labetalol and IV Lasix.  This evening patient was noted with progressive shortness of breath and increased oxygen requirement, she was placed on BiPAP and transferred to the ICU.  PCCM consulted due to risk for intubation  Past Medical History  HTN, CKD stage II-III, CVA, fibromyalgia, chronic pain on chronic opiates, anxiety and depression  Significant Hospital Events   12/10: Admitted to [facility] unit with acute hypoxic respiratory failure secondary to influenza.  Patient with progressive shortness of breath with increased oxygen requirement.  Placed on BiPAP and transferred to the ICU.  PCCM consulted  Consults:  PCCM  Procedures:  None  Significant Diagnostic Tests:  12/10:  Chest Xray>No acute finding 12/10: CTA Chest>No evidence of pulmonary emboli.   No acute abnormality noted.  Micro Data:  12/10: SARS-CoV-2 PCR> negative 12/10: Influenza A PCR > POSITIVE 12/10: Blood culture x2> 12/10: MRSA PCR>> Negative 12/10: Strep pneumo urinary antigen> 12/10: Legionella urinary antigen>  Antimicrobials:  None  OBJECTIVE  Blood pressure (!) 132/90, pulse (!) 124, temperature 99.4 F (37.4 C), temperature source Axillary, resp. rate (!) 37, height 5\' 6"  (1.676 m), weight 79.4 kg, SpO2 100 %.    FiO2 (%):  [40 %] 40 %   Intake/Output Summary (Last 24 hours) at 11/13/2022 1943 Last data filed at 11/13/2022 1900 Gross per 24 hour  Intake 1240 ml  Output 1300 ml  Net -60 ml   Filed Weights   11/13/22 0122 11/13/22 1752  Weight: 79 kg 79.4 kg     Physical Examination  GENERAL:56  year-old critically ill patient lying in the bed on BiPAP EYES: Pupils equal, round, reactive to light and accommodation. No scleral icterus. Extraocular muscles intact.  HEENT: Head atraumatic, normocephalic. Oropharynx and nasopharynx clear.  NECK:  Supple, no jugular venous distention. No thyroid enlargement, no tenderness.  LUNGS: Decreased breath sounds bilaterally, moderate wheezing with moderate use of accessory muscles of respiration.  CARDIOVASCULAR: S1, S2 normal. No murmurs, rubs, or gallops.  ABDOMEN: Soft, nontender, nondistended. Bowel sounds present. No organomegaly or mass.  EXTREMITIES: Upper and lower extremities are atraumatic in appearance without tenderness or deformity. No swelling or erythema. Muscle strength is 5/5 bilaterally. Capillary refill >3 seconds in all extremities. Pulses palpable.  NEUROLOGIC:The patient is awake, alert and nods appropriately  on BiPAP. Motor function is normal with muscle strength 5/5 bilaterally to upper and lower extremities. Sensation is intact bilaterally. Cranial nerves are intact.  Gait not checked.  PSYCHIATRIC:The  patient is on BIPAP SKIN: No obvious rash, lesion, or ulcer.   Labs/imaging that I havepersonally reviewed  (right click and "Reselect all SmartList Selections" daily)     Labs   CBC: Recent Labs  Lab 11/13/22 0133  WBC 7.5  HGB 14.0  HCT 43.3  MCV 85.1  PLT 258    Basic Metabolic Panel: Recent Labs  Lab 11/13/22 0133  NA 139  K 3.3*  CL 101  CO2 26  GLUCOSE 158*  BUN 23*  CREATININE 1.30*  CALCIUM 9.2   GFR: Estimated Creatinine Clearance: 52.6 mL/min (A) (by C-G formula based on SCr of 1.3 mg/dL (H)). Recent Labs  Lab 11/13/22 0108 11/13/22 0133 11/13/22 1456  PROCALCITON  --   --  0.10  WBC  --  7.5  --   LATICACIDVEN 1.5  --   --     Liver Function Tests: No results for input(s): "AST", "ALT", "ALKPHOS", "BILITOT", "PROT", "ALBUMIN" in the last 168 hours. No results for input(s): "LIPASE", "AMYLASE" in the last 168 hours. No results for input(s): "AMMONIA" in the last 168 hours.  ABG No results found for: "PHART", "PCO2ART", "PO2ART", "HCO3", "TCO2", "ACIDBASEDEF", "O2SAT"   Coagulation Profile: Recent Labs  Lab 11/13/22 0133  INR 1.0    Cardiac Enzymes: No results for input(s): "CKTOTAL", "CKMB", "CKMBINDEX", "TROPONINI" in the last 168 hours.  HbA1C: Hemoglobin A1C  Date/Time Value Ref Range Status  04/23/2014 04:04 AM 6.1 4.2 - 6.3 % Final    Comment:    The American Diabetes Association recommends that a primary goal of therapy should be <7% and that physicians should reevaluate the treatment regimen in patients with HbA1c values consistently >8%.    Hgb A1c MFr Bld  Date/Time Value Ref Range Status  05/31/2016 12:41 PM 5.6 <5.7 % Final    Comment:      For the purpose of screening for the presence of diabetes:   <5.7%       Consistent with the absence of diabetes 5.7-6.4 %   Consistent with increased risk for diabetes (prediabetes) >=6.5 %     Consistent with diabetes   This assay result is consistent with a decreased risk of  diabetes.   Currently, no consensus exists regarding use of hemoglobin A1c for diagnosis of diabetes in children.   According to American Diabetes Association (ADA) guidelines, hemoglobin A1c <7.0% represents optimal control in non-pregnant diabetic patients. Different metrics may apply to specific patient populations. Standards of Medical Care in Diabetes (ADA).       CBG: Recent Labs  Lab 11/13/22 1754  GLUCAP 96    Review of Systems:   UNABLE TO OBTAIN ON BIPAP  Past Medical History  She,  has a past medical history of Anemia, Anxiety, Chronic fatigue, Chronic insomnia (05/31/2016), Depression, Family history of adverse reaction to anesthesia, Fibromyalgia, Hypertension, Insomnia, Panic attack, Stroke [facility]), and Vitamin D deficiency (05/31/2016).   Surgical History    Past Surgical History:  Procedure Laterality Date   ABDOMINAL HYSTERECTOMY     COLONOSCOPY WITH PROPOFOL N/A 07/24/2018   Procedure: COLONOSCOPY WITH PROPOFOL;  Surgeon: Toledo, Boykin Nearing, MD;  Location: [facility] ENDOSCOPY;  Service: Gastroenterology;  Laterality: N/A;   ESOPHAGOGASTRODUODENOSCOPY (EGD) WITH PROPOFOL N/A 07/24/2018   Procedure: ESOPHAGOGASTRODUODENOSCOPY (EGD) WITH PROPOFOL;  Surgeon: Norma Fredrickson, Boykin Nearing, MD;  Location: [facility] ENDOSCOPY;  Service: Gastroenterology;  Laterality: N/A;   LAPAROSCOPIC BILATERAL SALPINGECTOMY Bilateral 01/25/2019   Procedure: LAPAROSCOPIC BILATERAL SALPINGECTOMY;  Surgeon: Schermerhorn, Gwen Her, MD;  Location: [facility] ORS;  Service: Gynecology;  Laterality: Bilateral;   NASAL TURBINATE REDUCTION Bilateral 06/14/2022   Procedure: INFERIOR TURBINATE REDUCTION;  Surgeon: Margaretha Sheffield, MD;  Location: [place];  Service: ENT;  Laterality: Bilateral;   PARATHYROIDECTOMY     PARTIAL HYSTERECTOMY     SEPTOPLASTY N/A 06/14/2022   Procedure: SEPTOPLASTY;  Surgeon: Margaretha Sheffield, MD;  Location: [place];  Service: ENT;  Laterality: N/A;   TRACHELECTOMY N/A 01/25/2019    Procedure: TRACHELECTOMY, laparoscopic cervical;  Surgeon: Boykin Nearing, MD;  Location: [facility] ORS;  Service: Gynecology;  Laterality: N/A;     Social History   reports that she has never smoked. She has never used smokeless tobacco. She reports current alcohol use of about 1.0 standard drink of alcohol per week. She reports that she does not use drugs.   Family History   Her family history includes Breast cancer (age of onset: 12) in her paternal aunt; Cancer in her father; Diabetes in her mother; Heart disease in her mother; Kidney disease in her mother; Stroke in her mother.   Allergies Allergies  Allergen Reactions   Clonidine Derivatives Hives   Quinolones Hives and Swelling   Trazodone Other (See Comments)    Caused sedation. Felt depressed while on medication.   Zofran [Ondansetron Hcl] Itching, Swelling and Other (See Comments)    swelling of lips     Home Medications  Prior to Admission medications   Medication Sig Start Date End Date Taking? Authorizing Provider  acetaminophen (TYLENOL) 650 MG CR tablet Take 1,300 mg by mouth every 8 (eight) hours as needed for pain.   Yes [provider]  lisinopril-hydrochlorothiazide (PRINZIDE,ZESTORETIC) 20-12.5 MG tablet Take 1 tablet by mouth 2 (two) times daily. 08/14/18  Yes [provider]  oxyCODONE-acetaminophen (PERCOCET/ROXICET) 5-325 MG tablet Take 1 tablet by mouth every 6 (six) hours as needed. 04/24/18  Yes [provider]  zolpidem (AMBIEN) 10 MG tablet Take 10 mg by mouth at bedtime. 08/07/18  Yes [provider]  albuterol (PROAIR HFA) 108 (90 BASE) MCG/ACT inhaler Inhale 2 puffs into the lungs as needed. Patient taking differently: Inhale 2 puffs into the lungs every 4 (four) hours as needed for wheezing or shortness of breath. 08/11/15   Ashok Norris, MD  baclofen (LIORESAL) 10 MG tablet Take 0.5-1 tablets (5-10 mg total) by mouth 3 (three) times daily as needed for muscle  spasms. Patient not taking: Reported on 11/13/2022 06/22/21   Hilts, Legrand Como, MD  cephALEXin (KEFLEX) 500 MG capsule Take 1 capsule (500 mg total) by mouth 2 (two) times daily. Patient not taking: Reported on 11/13/2022 06/14/22   Margaretha Sheffield, MD  clonazePAM (KLONOPIN) 1 MG tablet Take 1 tablet (1 mg total) by mouth 2 (two) times daily. Patient not taking: Reported on 11/13/2022 12/29/16   Frederich Cha, MD  predniSONE (DELTASONE) 10 MG tablet Start with 3 pills tomorrow. Taper over the next 6 days.  3,3,2,2,1,1. Patient not taking: Reported on 11/13/2022 06/14/22   Margaretha Sheffield, MD  tiZANidine (ZANAFLEX) 4 MG capsule Take 1 capsule (4 mg total) by mouth 3 (three) times daily. Patient not taking: Reported on 11/13/2022 12/29/16   Frederich Cha, MD  valACYclovir (VALTREX) 1000 MG tablet Take 1,000 mg by mouth See admin instructions. Take 1000mg  by mouth twice a day for  7 days as needed for outbreaks 08/11/18   [provider]  Scheduled Meds:  budesonide (PULMICORT) nebulizer solution  0.5 mg Nebulization BID   [START ON 11/14/2022] Chlorhexidine Gluconate Cloth  6 each Topical Q0600   clonazePAM  1 mg Oral BID   enoxaparin (LOVENOX) injection  40 mg Subcutaneous Q24H   hydrochlorothiazide  12.5 mg Oral Daily   ipratropium-albuterol  3 mL Nebulization Q4H   methylPREDNISolone (SOLU-MEDROL) injection  40 mg Intravenous Q12H   oseltamivir  30 mg Oral BID   tiZANidine  4 mg Oral TID   Continuous Infusions:  [START ON 11/14/2022] acetaminophen 1,000 mg (11/13/22 2114)   magnesium sulfate bolus IVPB 2 g (11/13/22 2136)   PRN Meds:.acetaminophen **OR** acetaminophen, ALPRAZolam, labetalol, morphine injection, ondansetron **OR** ondansetron (ZOFRAN) IV, oxyCODONE  Active Hospital Problem list     Assessment & Plan:  Acute Hypoxic Respiratory Failure secondary to Influenza A virus -Supplemental O2 as needed to maintain O2 saturations 88 to 92% -BiPAP, wean as tolerated -High risk for  intubation -Follow intermittent ABG and chest x-ray as needed -Repeat CXR on 12/10 shows no acute finding -PRN IV Lasix as blood pressure and renal function permits; received 40 mg IV lasix x 1 -Check Respiratory panel, strep pneumo and Legionella antigen -As needed bronchodilators  Sepsis in the setting of above Meets SIRS Criteria: -Monitor fever curve -Trend WBC's & Procalcitonin -Follow cultures  -Hold empiric abx pending cultures & sensitivities   Hypertension Elevated troponin- likely demand -Continuous cardiac monitoring -Maintain MAP greater than 65 -Continue Norvasc, metoprolol. -Repeat 2D Echocardiogram   AKI on CKD Stage III Hypokalemia -Monitor I&O's / urinary output -Follow BMP -Ensure adequate renal perfusion -Avoid nephrotoxic agents as able -Replace electrolytes as indicated   Best practice:  Diet:  [id number] Pain/Anxiety/Delirium protocol (if indicated): {Pain/Anxiety/Delirium:26941} VAP protocol (if indicated): {VAP:29640} DVT prophylaxis: {DVT Prophylaxis:26933} GI prophylaxis: {GI:26934} Glucose control:  {Glucose Control:26935} Central venous access:  {Central Venous Access:26936} Arterial line:  {Central Venous Access:26936} Foley:  {Central Venous Access:26936} Mobility:  {Mobility:26937}  PT consulted: {PT Consult:26938} Last date of multidisciplinary goals of care discussion [***] Code Status:  {Code Status:26939} Disposition: ***   = Goals of Care = Code Status Order: FULL  Primary Emergency ContactJermia, Thiede, Home Phone: [phone] Wishes to pursue full aggressive treatment and intervention options, including CPR and intubation, but goals of care will be addressed on going with family if that should become necessary.  Critical care time: 45 minutes       Rufina Falco, DNP, CCRN, FNP-C, AGACNP-BC Acute Care Nurse Practitioner [facility]  PCCM on call pager [phone] until 7 am

## 2022-11-13 NOTE — Assessment & Plan Note (Signed)
CVA 2015 with no residual deficit Not currently on aspirin or statin

## 2022-11-13 NOTE — Assessment & Plan Note (Signed)
Renal function at baseline 

## 2022-11-13 NOTE — Plan of Care (Signed)
  Problem: Pain Managment: Goal: General experience of comfort will improve Outcome: Progressing   Problem: Safety: Goal: Ability to remain free from injury will improve Outcome: Progressing   Problem: Skin Integrity: Goal: Risk for impaired skin integrity will decrease Outcome: Progressing   

## 2022-11-13 NOTE — Progress Notes (Signed)
   11/13/22 1401  Assess: MEWS Score  Temp 99.8 F (37.7 C)  BP (!) 129/49  MAP (mmHg) 74  Pulse Rate (!) 104  Resp (!) 21  Level of Consciousness Alert  SpO2 100 %  O2 Device HFNC  O2 Flow Rate (L/min) 10 L/min  Assess: MEWS Score  MEWS Temp 0  MEWS Systolic 0  MEWS Pulse 1  MEWS RR 1  MEWS LOC 0  MEWS Score 2  MEWS Score Color Yellow  Assess: if the MEWS score is Yellow or Red  Were vital signs taken at a resting state? Yes  Focused Assessment Change from prior assessment (see assessment flowsheet)  Does the patient meet 2 or more of the SIRS criteria? Yes  Does the patient have a confirmed or suspected source of infection? Yes  Provider and Rapid Response Notified? Yes  MEWS guidelines implemented *See Row Information* Yes  Treat  MEWS Interventions Escalated (See documentation below)  Pain Scale 0-10  Pain Score 0  Take Vital Signs  Increase Vital Sign Frequency  Yellow: Q 2hr X 2 then Q 4hr X 2, if remains yellow, continue Q 4hrs  Escalate  MEWS: Escalate Yellow: discuss with charge nurse/RN and consider discussing with provider and RRT  Notify: Charge Nurse/RN  Name of Charge Nurse/RN Notified Teresa, RN  Date Charge Nurse/RN Notified 11/13/22  Time Charge Nurse/RN Notified 1405  Provider Notification  Provider Name/Title Arnetha Courser, MD  Date Provider Notified 11/13/22  Time Provider Notified 1430  Method of Notification Call  Notification Reason Change in status  Provider response See new orders  Date of Provider Response 11/13/22  Time of Provider Response 1430  Document  Progress note created (see row info) Yes  Assess: SIRS CRITERIA  SIRS Temperature  0  SIRS Pulse 1  SIRS Respirations  1  SIRS WBC 1  SIRS Score Sum  3
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
## 2022-11-13 NOTE — Assessment & Plan Note (Signed)
Troponin slightly elevated at 21 and EKG nonacute Suspect secondary to demand ischemia

## 2022-11-13 NOTE — ED Notes (Signed)
Advised nurse that patient has ready bed 

## 2022-11-13 NOTE — ED Provider Notes (Addendum)
[facility] Provider Note    Event Date/Time   First MD Initiated Contact with Patient 11/13/22 0141     (approximate)   History   Shortness of Breath   HPI Level 5 caveat:  history/ROS limited by acute/critical illness  Victoria Guzman is a 56 y.o. female who presents by private vehicle with her sister and daughter for evaluation of acute dyspnea.  Unclear as to how long she has been feeling ill because the patient is unable or unwilling to talk other than a few words at a time, but the family reports that the shortness of breath has been present since at least yesterday.  Reportedly got significantly worse tonight.  She has never had an episode like this before.  She reports that both sides of her ribs and her back hurt and she feels like she cannot take a deep breath.  She reportedly has no history of heart problems and denies COPD although she and her family report chronic bronchitis.  She has no history of blood clots in the legs of the lungs.     Physical Exam   ED Triage Vitals  Enc Vitals Group     BP 11/13/22 0123 (!) 166/92     Pulse Rate 11/13/22 0123 (!) 127     Resp 11/13/22 0123 (!) 26     Temp 11/13/22 0123 98.3 F (36.8 C)     Temp Source 11/13/22 0123 Oral     SpO2 11/13/22 0123 (!) 88 %     Weight 11/13/22 0122 79 kg (174 lb 2.6 oz)     Height 11/13/22 0122 1.676 m (5\' 6" )     Head Circumference --      Peak Flow --      Pain Score 11/13/22 0122 0     Pain Loc --      Pain Edu? --      Excl. in [place]? --      Most recent vital signs: Vitals:   11/13/22 0124 11/13/22 0530  BP:    Pulse:    Resp:    Temp:  98.5 F (36.9 C)  SpO2: 96%      General: Awake, substantial respiratory distress. CV:  Good peripheral perfusion.  Tachycardia at about 125, regular rhythm, normal heart sounds. Resp:  Patient is splinting with small and shallow breaths, decreased air movement, accessory muscle usage and intercostal retractions.  No  obvious wheezing or coarse breath sounds. Abd:  No distention.  No tenderness to palpation. Other:  Patient has her eyes closed and is focusing on breathing although she is tachypneic and taking small shallow breaths.  She will respond to direct questions when encouraged to do so but does so only in a limited fashion.  She is not able to provide any additional history.   ED Results / Procedures / Treatments   Labs (all labs ordered are listed, but only abnormal results are displayed) Labs Reviewed  RESP PANEL BY RT-PCR (FLU A&B, COVID) ARPGX2 - Abnormal; Notable for the following components:      Result Value   Influenza A by PCR POSITIVE (*)    All other components within normal limits  BASIC METABOLIC PANEL - Abnormal; Notable for the following components:   Potassium 3.3 (*)    Glucose, Bld 158 (*)    BUN 23 (*)    Creatinine, Ser 1.30 (*)    GFR, Estimated 49 (*)    All other components within normal  limits  BRAIN NATRIURETIC PEPTIDE - Abnormal; Notable for the following components:   B Natriuretic Peptide 185.0 (*)    All other components within normal limits  TROPONIN I (HIGH SENSITIVITY) - Abnormal; Notable for the following components:   Troponin I (High Sensitivity) 21 (*)    All other components within normal limits  CULTURE, BLOOD (ROUTINE X 2)  CULTURE, BLOOD (ROUTINE X 2)  CBC  LACTIC ACID, PLASMA  PROTIME-INR  HIV ANTIBODY (ROUTINE TESTING W REFLEX)  POC URINE PREG, ED  TROPONIN I (HIGH SENSITIVITY)     EKG  ED ECG REPORT I, Loleta Rose, the attending physician, personally viewed and interpreted this ECG.  Date: 11/13/2022 EKG Time: 1:24 AM Rate: 127 Rhythm: This tachycardia QRS Axis: normal Intervals: normal ST/T Wave abnormalities: Non-specific ST segment / T-wave changes, possibly some rate related ischemia, but difficult to appreciate  narrative Interpretation: Possible rate related ischemia no definitive evidence of acute ischemia; does not meet  STEMI criteria.    RADIOLOGY I viewed and interpreted the patient's 1 view chest x-ray.  I see no evidence of pneumothorax or pneumonia or widened mediastinum.  I also read the radiologist's report, which confirmed no acute findings.    PROCEDURES:  Critical Care performed: Yes, see critical care procedure note(s)  .1-3 Lead EKG Interpretation  Performed by: Loleta Rose, MD Authorized by: Loleta Rose, MD     Interpretation: abnormal     ECG rate:  127   ECG rate assessment: tachycardic     Rhythm: sinus tachycardia     Ectopy: none     Conduction: normal   .Critical Care  Performed by: Loleta Rose, MD Authorized by: Loleta Rose, MD   Critical care provider statement:    Critical care time (minutes):  45   Critical care time was exclusive of:  Separately billable procedures and treating other patients   Critical care was necessary to treat or prevent imminent or life-threatening deterioration of the following conditions:  Respiratory failure   Critical care was time spent personally by me on the following activities:  Development of treatment plan with patient or surrogate, evaluation of patient's response to treatment, examination of patient, obtaining history from patient or surrogate, ordering and performing treatments and interventions, ordering and review of laboratory studies, ordering and review of radiographic studies, pulse oximetry, re-evaluation of patient's condition and review of old charts    MEDICATIONS ORDERED IN ED: Medications  oseltamivir (TAMIFLU) capsule 30 mg (has no administration in time range)  lactated ringers bolus 1,000 mL (1,000 mLs Intravenous New Bag/Given 11/13/22 0648)  enoxaparin (LOVENOX) injection 40 mg (has no administration in time range)  acetaminophen (TYLENOL) tablet 650 mg (has no administration in time range)    Or  acetaminophen (TYLENOL) suppository 650 mg (has no administration in time range)  ondansetron (ZOFRAN) tablet  4 mg (has no administration in time range)    Or  ondansetron (ZOFRAN) injection 4 mg (has no administration in time range)  0.9 %  sodium chloride infusion (has no administration in time range)  albuterol (PROVENTIL) (2.5 MG/3ML) 0.083% nebulizer solution 2.5 mg (2.5 mg Nebulization Given 11/13/22 0449)  lisinopril (ZESTRIL) tablet 20 mg (has no administration in time range)    And  hydrochlorothiazide (HYDRODIURIL) tablet 12.5 mg (has no administration in time range)  clonazePAM (KLONOPIN) tablet 1 mg (has no administration in time range)  tiZANidine (ZANAFLEX) tablet 4 mg (has no administration in time range)  zolpidem (AMBIEN) tablet 5 mg (has  no administration in time range)  oxyCODONE (Oxy IR/ROXICODONE) immediate release tablet 5 mg (5 mg Oral Given 11/13/22 0653)  lactated ringers bolus 1,000 mL (0 mLs Intravenous Stopped 11/13/22 0448)  iohexol (OMNIPAQUE) 350 MG/ML injection 75 mL (75 mLs Intravenous Contrast Given 11/13/22 0221)  potassium chloride SA (KLOR-CON M) CR tablet 40 mEq (40 mEq Oral Given 11/13/22 0448)     IMPRESSION / MDM / ASSESSMENT AND PLAN / ED COURSE  I reviewed the triage vital signs and the nursing notes.                              Differential diagnosis includes, but is not limited to, pneumothorax, pneumonia, respiratory viral infection, pulmonary embolism, ACS.  Patient's presentation is most consistent with acute presentation with potential threat to life or bodily function.  Lab/studies ordered: EKG, cardiac monitoring, 1 view chest x-ray, CBC, respiratory viral panel, BMP, high-sensitivity troponin, BNP, pro time-INR, lactic acid, blood cultures x 2.  The patient's vital signs are substantially abnormal with tachycardia and tachypnea with obvious increased respiratory effort and accessory muscle usage.  She was initially hypoxic in triage and has a new oxygen requirement of 4 L to keep or maintain appropriate oxygenation level.  She is afebrile  and although she has no obvious risk factors for pulmonary embolism, this is of substantial concern as it would explain the "rib pain" tachycardia, tachypnea, shortness of breath, etc.  Low suspicion for AAS and ACS.  The patient is on the cardiac monitor to evaluate for evidence of arrhythmia and/or significant heart rate changes.  I viewed and interpreted the patient's EKG and there may be some rate related ischemia but unlikely to represent ACS.  The patient is denying chest pain but having some bilateral rib pain and back pain which could be related to PE or could be related to her increased work of breathing.  She is splinting and taking small-volume rapid breaths.  She has no wheezing and reports no history of COPD and says that she is not a smoker.  I will hold off on nebulizer treatments at this time.  The family said that she has a nebulizer breathing treatments that she uses at home and they did not seem to help.  As document above, I viewed and interpreted her chest x-ray and there is nothing obvious or acute causing the symptoms.  Additional lab work is pending.   Clinical Course as of 11/13/22 1610  Victoria Guzman Nov 13, 2022  0156 Given my very high concern for pulmonary embolism, I reviewed her lab results in the past.  She has had episodes of acute kidney injury in the past with an elevated creatinine and decreased GFR.  However the most recent labs available in care everywhere indicate a GFR of greater than 60 and a creatinine of about 1.1.  Given the very limited evidence regarding contrast-induced nephropathy and the high risk to life and limb of an undiagnosed pulmonary embolism, I authorized and ordered a CTA chest PE protocol and called the CT technologist and ask [place] to proceed without awaiting the lab results.  I believe this is in the patient's best interest. [CF]  0315 I viewed and interpreted the patient's CTA chest and see no evidence of pulmonary embolism nor pneumonia.  Radiology  report confirms.  Patient is still in distress but not requiring BiPAP.  She appears more uncomfortable than anything.  However she has a new oxygen  requirement.  She has a mild troponin elevation of 21 which I believe represents demand ischemia.  Her lactic acid is normal.  Creatinine is generally reassuring at 1.3.  I had already ordered LR 1 L IV bolus for hydration and given her insensible losses from her tachypnea.  Patient is influenza A positive leading to acute hypoxic respiratory failure requiring oxygen.  I am consulting the hospitalist for admission.  I updated the patient and family who agree with the plan. [CF]  [id number] I consulted Dr. Damita Dunnings with the hospitalist service who will admit the patient. [CF]    Clinical Course User Index [CF] Hinda Kehr, MD     FINAL CLINICAL IMPRESSION(S) / ED DIAGNOSES   Final diagnoses:  Acute respiratory failure with hypoxia ([place])  Influenza A     Rx / DC Orders   ED Discharge Orders     None        Note:  This document was prepared using Dragon voice recognition software and may include unintentional dictation errors.   Hinda Kehr, MD 11/13/22 Victoria Guzman    Hinda Kehr, MD 11/13/22 [phone]
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
## 2022-11-13 NOTE — Assessment & Plan Note (Addendum)
Likely secondary to thiazide diuretic for blood pressure oral repletion with 40 mEq KCl

## 2022-11-13 NOTE — ED Triage Notes (Signed)
Pt presents via POV with complaints of SOB that started yesterday. Pt with dyspnea at rest. O2 sat on RA was 88-89% Pt placed on 2L with minimal improvement in sats to 91%, Pt O2 increased to 4L and O2 improved to 96%. No pulmonary hx per family. Denies CP or SOB.

## 2022-11-13 NOTE — Progress Notes (Signed)
Pt on 10L HFNC with tachypnea and labored breathing, dyspnea at rest and with exertion. Arnetha Courser, MD notified. See new orders including transfer to higher level of care and BiPAP.

## 2022-11-13 NOTE — Assessment & Plan Note (Signed)
Tylenol as needed.

## 2022-11-13 NOTE — Progress Notes (Signed)
Stephanie in lab called to notify RN that pt troponin level is 60. Troponin level earlier today was 21. Arnetha Courser, MD notified of increase in value. Pt remains on telemetry. See new orders.

## 2022-11-13 NOTE — Assessment & Plan Note (Signed)
Continue home Zestoretic
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
## 2022-11-13 NOTE — Assessment & Plan Note (Signed)
Awaiting updated meds to resume

## 2022-11-13 NOTE — Progress Notes (Signed)
PHARMACY NOTE:  ANTIMICROBIAL RENAL DOSAGE ADJUSTMENT  Current antimicrobial regimen includes a mismatch between antimicrobial dosage and estimated renal function.  As per policy approved by the Pharmacy & Therapeutics and Medical Executive Committees, the antimicrobial dosage will be adjusted accordingly.  Current antimicrobial dosage:  Oseltamivir 75 mg PO BID X 5  Indication: Flu   Renal Function:  Estimated Creatinine Clearance: 52.5 mL/min (A) (by C-G formula based on SCr of 1.3 mg/dL (H)). []      On intermittent HD, scheduled: []      On CRRT    Antimicrobial dosage has been changed to:  30 mg PO BID X 5  Additional comments:   Thank you for allowing pharmacy to be a part of this patient's care.  Mirtie Bastyr D, [facility] 11/13/2022 4:14 AM

## 2022-11-14 ENCOUNTER — Inpatient Hospital Stay (HOSPITAL_COMMUNITY)
Admit: 2022-11-14 | Discharge: 2022-11-14 | Disposition: A | Discharge status: Home / Self Care | Payer: Medicare Other | Attending: Internal Medicine | Admitting: Internal Medicine

## 2022-11-14 ENCOUNTER — Inpatient Hospital Stay: Payer: Medicare Other

## 2022-11-14 DIAGNOSIS — J9601 Acute respiratory failure with hypoxia: Secondary | ICD-10-CM

## 2022-11-14 DIAGNOSIS — J101 Influenza due to other identified influenza virus with other respiratory manifestations: Secondary | ICD-10-CM

## 2022-11-14 DIAGNOSIS — R7989 Other specified abnormal findings of blood chemistry: Secondary | ICD-10-CM

## 2022-11-14 LAB — ECHOCARDIOGRAM COMPLETE
AR max vel: 1.69 cm2
AV Area VTI: 2.02 cm2
AV Area mean vel: 1.9 cm2
AV Mean grad: 10 mmHg
AV Peak grad: 17 mmHg
Ao pk vel: 2.06 m/s
Area-P 1/2: 7.9 cm2
Height: 66 in
S' Lateral: 2 cm
Weight: 2800.72 oz

## 2022-11-14 LAB — STREP PNEUMONIAE URINARY ANTIGEN: Strep Pneumo Urinary Antigen: NEGATIVE

## 2022-11-14 LAB — HIV ANTIBODY (ROUTINE TESTING W REFLEX): HIV Screen 4th Generation wRfx: NONREACTIVE

## 2022-11-14 MED ORDER — LISINOPRIL 20 MG PO TABS: 20.0000 mg | ORAL_TABLET | Freq: Every day | ORAL | Status: DC

## 2022-11-14 MED ORDER — IPRATROPIUM-ALBUTEROL 0.5-2.5 (3) MG/3ML IN SOLN
3.0000 mL | Freq: Four times a day (QID) | RESPIRATORY_TRACT | Status: DC
  Administered 2022-11-14: 3 mL via RESPIRATORY_TRACT
  Filled 2022-11-14: qty 3

## 2022-11-14 MED ORDER — GUAIFENESIN ER 600 MG PO TB12
1200.0000 mg | ORAL_TABLET | Freq: Two times a day (BID) | ORAL | Status: DC
  Administered 2022-11-14 – 2022-11-17 (×7): 1200 mg via ORAL
  Filled 2022-11-14 (×6): qty 2

## 2022-11-14 MED ORDER — PHENOL 1.4 % MT LIQD
1.0000 | OROMUCOSAL | Status: DC | PRN
  Administered 2022-11-14: 1 via OROMUCOSAL
  Filled 2022-11-14 (×2): qty 177

## 2022-11-14 MED ORDER — ACETAMINOPHEN 325 MG PO TABS
650.0000 mg | ORAL_TABLET | Freq: Four times a day (QID) | ORAL | Status: DC
  Administered 2022-11-14 – 2022-11-17 (×7): 650 mg via ORAL
  Filled 2022-11-14 (×10): qty 2

## 2022-11-14 MED ORDER — IPRATROPIUM-ALBUTEROL 0.5-2.5 (3) MG/3ML IN SOLN
3.0000 mL | Freq: Three times a day (TID) | RESPIRATORY_TRACT | Status: DC
  Administered 2022-11-14 – 2022-11-17 (×7): 3 mL via RESPIRATORY_TRACT
  Filled 2022-11-14 (×8): qty 3

## 2022-11-14 MED ORDER — PREDNISONE 20 MG PO TABS
20.0000 mg | ORAL_TABLET | Freq: Every day | ORAL | Status: AC
  Administered 2022-11-15 – 2022-11-17 (×3): 20 mg via ORAL
  Filled 2022-11-14 (×3): qty 1

## 2022-11-14 MED ORDER — PANTOPRAZOLE SODIUM 40 MG PO TBEC
40.0000 mg | DELAYED_RELEASE_TABLET | Freq: Every day | ORAL | Status: DC
  Administered 2022-11-14 – 2022-11-17 (×3): 40 mg via ORAL
  Filled 2022-11-14 (×4): qty 1

## 2022-11-14 NOTE — Progress Notes (Signed)
PROGRESS NOTE    Victoria BusmanMelissa L Brummell  ZOX:[id number]  DOB: [birth year]  DOA: 11/13/2022 PCP: Gracelyn NurseJohnston, John D, MD Outpatient Specialists:   Hospital course:  56 year old female with HTN, CKD 3, CVA 215, fibromyalgia and chronic pain on opiates was admitted yesterday with acute hypoxic respiratory failure secondary to influenza A.  She worsened overnight and was transferred to the ICU for observation and management.   Subjective:  Patient feels she is doing better, notes she is still very weak but feels her respiratory status is improved   Objective: Vitals:   11/14/22 1000 11/14/22 1100 11/14/22 1200 11/14/22 1309  BP: (!) 172/97 126/80 128/84   Pulse: (!) 101 (!) 108 98   Resp: (!) 24 (!) 24 (!) 24   Temp:      TempSrc:      SpO2: 95% 95% 94% 96%  Weight:      Height:        Intake/Output Summary (Last 24 hours) at 11/14/2022 1740 Last data filed at 11/14/2022 0600 Gross per 24 hour  Intake 200 ml  Output 1600 ml  Net -1400 ml   Filed Weights   11/13/22 0122 11/13/22 1752  Weight: 79 kg 79.4 kg     Exam:  General: Patient appearing older than stated age sitting up in bed eating breakfast with moderate tachypnea Eyes: sclera anicteric, conjuctiva mild injection bilaterally CVS: S1-S2, regular  Respiratory:  decreased air entry bilaterally secondary to decreased inspiratory effort, rales at bases  GI: NABS, soft, NT  LE: Warm and well-perfused Neuro: A/O x 3,  grossly nonfocal.   Data Reviewed:  Basic Metabolic Panel: Recent Labs  Lab 11/13/22 0133  NA 139  K 3.3*  CL 101  CO2 26  GLUCOSE 158*  BUN 23*  CREATININE 1.30*  CALCIUM 9.2    CBC: Recent Labs  Lab 11/13/22 0133  WBC 7.5  HGB 14.0  HCT 43.3  MCV 85.1  PLT 258     Scheduled Meds:  budesonide (PULMICORT) nebulizer solution  0.5 mg Nebulization BID   Chlorhexidine Gluconate Cloth  6 each Topical Q0600   clonazePAM  1 mg Oral BID   enoxaparin (LOVENOX) injection  40 mg  Subcutaneous Q24H   guaiFENesin  1,200 mg Oral BID   hydrochlorothiazide  12.5 mg Oral Daily   ipratropium-albuterol  3 mL Nebulization TID   [START ON 11/15/2022] lisinopril  20 mg Oral Daily   oseltamivir  30 mg Oral BID   pantoprazole  40 mg Oral Daily   [START ON 11/15/2022] predniSONE  20 mg Oral Q breakfast   tiZANidine  4 mg Oral TID   Continuous Infusions:   Assessment & Plan:   Acute hypoxic respiratory failure due to influenza A Patient is improved with steroids, Tamiflu and inhaled bronchodilators Appreciate PCCM consultation Patient has been transitioned over to prednisone from Solu-Medrol Continue Tamiflu and DuoNebs  Elevated troponin To be secondary to demand ischemia  HTN Lisinopril and HCTZ restarted per home doses  AKI Recheck in the morning after hydration    Studies: ECHOCARDIOGRAM COMPLETE  Result Date: 11/14/2022    ECHOCARDIOGRAM REPORT   Patient Name:   Victoria BusmanMELISSA L Guzman Date of Exam: 11/14/2022 Medical Rec #:  [id number]         Height:       66.0 in Accession #:    [id number]        Weight:       175.0 lb Date of Birth:  [birth year]  BSA:          1.890 m Patient Age:    56 years          BP:           173/91 mmHg Patient Gender: F                 HR:           108 bpm. Exam Location:  [facility] Procedure: 2D Echo, Color Doppler and Cardiac Doppler Indications:     Elevated troponin  History:         Patient has prior history of Echocardiogram examinations, most                  recent 04/24/2014. Stroke and CKD, Signs/Symptoms:Shortness of                  Breath; Risk Factors:Hypertension.  Sonographer:     Humphrey Rolls Referring Phys:  [id number] FIEPPIR AMIN Diagnosing Phys: Lorine Bears MD  Sonographer Comments: No subcostal window. IMPRESSIONS  1. Left ventricular ejection fraction, by estimation, is 60 to 65%. The left ventricle has normal function. The left ventricle has no regional wall motion abnormalities. There is mild left ventricular  hypertrophy. Left ventricular diastolic parameters are consistent with Grade I diastolic dysfunction (impaired relaxation).  2. Right ventricular systolic function is normal. The right ventricular size is normal. Tricuspid regurgitation signal is inadequate for assessing PA pressure.  3. The mitral valve is normal in structure. No evidence of mitral valve regurgitation. No evidence of mitral stenosis.  4. The aortic valve was not well visualized. There is mild calcification of the aortic valve. Aortic valve regurgitation is not visualized. Mild aortic valve stenosis. Aortic valve area, by VTI measures 2.02 cm. Aortic valve mean gradient measures 10.0  mmHg.  5. The inferior vena cava is normal in size with greater than 50% respiratory variability, suggesting right atrial pressure of 3 mmHg. FINDINGS  Left Ventricle: Left ventricular ejection fraction, by estimation, is 60 to 65%. The left ventricle has normal function. The left ventricle has no regional wall motion abnormalities. The left ventricular internal cavity size was normal in size. There is  mild left ventricular hypertrophy. Left ventricular diastolic parameters are consistent with Grade I diastolic dysfunction (impaired relaxation). Right Ventricle: The right ventricular size is normal. No increase in right ventricular wall thickness. Right ventricular systolic function is normal. Tricuspid regurgitation signal is inadequate for assessing PA pressure. Left Atrium: Left atrial size was normal in size. Right Atrium: Right atrial size was normal in size. Pericardium: There is no evidence of pericardial effusion. Mitral Valve: The mitral valve is normal in structure. No evidence of mitral valve regurgitation. No evidence of mitral valve stenosis. Tricuspid Valve: The tricuspid valve is normal in structure. Tricuspid valve regurgitation is not demonstrated. No evidence of tricuspid stenosis. Aortic Valve: The aortic valve was not well visualized. There is  mild calcification of the aortic valve. Aortic valve regurgitation is not visualized. Mild aortic stenosis is present. Aortic valve mean gradient measures 10.0 mmHg. Aortic valve peak gradient measures 17.0 mmHg. Aortic valve area, by VTI measures 2.02 cm. Pulmonic Valve: The pulmonic valve was normal in structure. Pulmonic valve regurgitation is not visualized. No evidence of pulmonic stenosis. Aorta: The aortic root is normal in size and structure. Venous: The inferior vena cava is normal in size with greater than 50% respiratory variability, suggesting right atrial pressure of 3 mmHg. IAS/Shunts: No atrial level shunt  detected by color flow Doppler.  LEFT VENTRICLE PLAX 2D LVIDd:         3.40 cm   Diastology LVIDs:         2.00 cm   LV e' medial:    9.03 cm/s LV PW:         1.10 cm   LV E/e' medial:  7.7 LV IVS:        0.90 cm   LV e' lateral:   8.27 cm/s LVOT diam:     2.00 cm   LV E/e' lateral: 8.4 LV SV:         69 LV SV Index:   36 LVOT Area:     3.14 cm  RIGHT VENTRICLE RV Basal diam:  2.10 cm RV S prime:     12.10 cm/s LEFT ATRIUM             Index        RIGHT ATRIUM          Index LA diam:        3.00 cm 1.59 cm/m   RA Area:     7.33 cm LA Vol (A2C):   28.4 ml 15.03 ml/m  RA Volume:   13.20 ml 6.99 ml/m LA Vol (A4C):   28.4 ml 15.03 ml/m LA Biplane Vol: 29.8 ml 15.77 ml/m  AORTIC VALVE                     PULMONIC VALVE AV Area (Vmax):    1.69 cm      PV Vmax:       1.03 m/s AV Area (Vmean):   1.90 cm      PV Vmean:      70.300 cm/s AV Area (VTI):     2.02 cm      PV VTI:        0.162 m AV Vmax:           206.00 cm/s   PV Peak grad:  4.2 mmHg AV Vmean:          147.000 cm/s  PV Mean grad:  2.0 mmHg AV VTI:            0.340 m AV Peak Grad:      17.0 mmHg AV Mean Grad:      10.0 mmHg LVOT Vmax:         111.00 cm/s LVOT Vmean:        88.700 cm/s LVOT VTI:          0.219 m LVOT/AV VTI ratio: 0.64  AORTA Ao Root diam: 2.80 cm MITRAL VALVE MV Area (PHT): 7.90 cm     SHUNTS MV Decel Time: 96 msec       Systemic VTI:  0.22 m MV E velocity: 69.70 cm/s   Systemic Diam: 2.00 cm MV A velocity: 117.00 cm/s MV E/A ratio:  0.60 Lorine Bears MD Electronically signed by Lorine Bears MD Signature Date/Time: 11/14/2022/11:24:11 AM    Final    DG Chest Port 1 View  Result Date: 11/14/2022 CLINICAL DATA:  [id number] Acute respiratory failure with hypoxemia (HCC) [id number] EXAM: PORTABLE CHEST 1 VIEW COMPARISON:  Chest radiograph from one day prior. FINDINGS: Stable cardiomediastinal silhouette with normal heart size. No pneumothorax. No pleural effusion. Lungs appear clear, with no acute consolidative airspace disease and no pulmonary edema. IMPRESSION: No active disease. Electronically Signed   By: Delbert Phenix M.D.   On: 11/14/2022 09:19   DG Chest  1 View  Result Date: 11/13/2022 CLINICAL DATA:  Shortness of breath EXAM: CHEST  1 VIEW COMPARISON:  11/13/2022 1:34 a.m. FINDINGS: The heart size and mediastinal contours are within normal limits. Both lungs are clear. The visualized skeletal structures are unremarkable. IMPRESSION: No active disease. Electronically Signed   By: Sharlet Salina M.D.   On: 11/13/2022 20:27   CT Angio Chest PE W/Cm &/Or Wo Cm  Result Date: 11/13/2022 CLINICAL DATA:  Shortness of breath for 2 days with hypoxia, initial encounter EXAM: CT ANGIOGRAPHY CHEST WITH CONTRAST TECHNIQUE: Multidetector CT imaging of the chest was performed using the standard protocol during bolus administration of intravenous contrast. Multiplanar CT image reconstructions and MIPs were obtained to evaluate the vascular anatomy. RADIATION DOSE REDUCTION: This exam was performed according to the departmental dose-optimization program which includes automated exposure control, adjustment of the mA and/or kV according to patient size and/or use of iterative reconstruction technique. CONTRAST:  47mL OMNIPAQUE IOHEXOL 350 MG/ML SOLN COMPARISON:  Chest x-ray from earlier in the same day. FINDINGS: Cardiovascular:  Thoracic aorta shows no aneurysmal dilatation or dissection. No cardiac enlargement is noted. No coronary calcifications are seen. The pulmonary artery shows a normal branching pattern bilaterally. No filling defect to suggest pulmonary embolism is identified. Mediastinum/Nodes: Thoracic inlet is within normal limits. No hilar or mediastinal adenopathy is noted. The esophagus as visualized is within normal limits. Lungs/Pleura: Lungs are well aerated bilaterally. No focal infiltrate or sizable effusion is seen. No parenchymal nodules are noted. Upper Abdomen: Visualized upper abdomen is within normal limits. Musculoskeletal: Degenerative changes of the thoracic spine are noted. No acute rib abnormality is seen. Review of the MIP images confirms the above findings. IMPRESSION: No evidence of pulmonary emboli. No acute abnormality noted. Electronically Signed   By: Alcide Clever M.D.   On: 11/13/2022 02:37   DG Chest Port 1 View  Result Date: 11/13/2022 CLINICAL DATA:  Shortness of breath. EXAM: PORTABLE CHEST 1 VIEW COMPARISON:  Chest and rib radiographs 10/23/2020 FINDINGS: The cardiomediastinal contours are normal. The lungs are clear. Pulmonary vasculature is normal. No consolidation, pleural effusion, or pneumothorax. Thoracic spondylosis. No acute osseous abnormalities are seen. IMPRESSION: No acute chest findings. Electronically Signed   By: Narda Rutherford M.D.   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

## 2022-11-14 NOTE — TOC Initial Note (Signed)
Transition of Care [facility]) - Initial/Assessment Note    Patient Details  Name: Victoria Guzman MRN: [id number] Date of Birth: 1966/10/20  Transition of Care [facility]) CM/SW Contact:    Allayne Butcher, RN Phone Number: 11/14/2022, 10:29 AM  Clinical Narrative:                  Transition of Care (TOC) Screening Note   Patient Details  Name: Victoria Guzman Date of Birth: [birth year]   Transition of Care [facility]) CM/SW Contact:    Allayne Butcher, RN Phone Number: 11/14/2022, 10:29 AM    Transition of Care Department (TOC) has reviewed patient and no TOC needs have been identified at this time. We will continue to monitor patient advancement through interdisciplinary progression rounds. If new patient transition needs arise, please place a TOC consult.          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) 11/13/2022   History of CVA (cerebrovascular accident) 11/13/2022   Elevated troponin 11/13/2022   Acute respiratory failure with hypoxemia (HCC) 11/13/2022   Post-operative state 01/25/2019   ARF (acute renal failure) (HCC) 04/24/2018   Acute renal failure (ARF) (HCC) 04/22/2018   Hypercalcemia 05/31/2016   Hypokalemia 05/31/2016   Hypochloremia 05/31/2016   Screening for HIV (human immunodeficiency virus) 05/31/2016   Screen for STD (sexually transmitted  disease) 05/31/2016   Vitamin D deficiency 05/31/2016   Chronic insomnia 05/31/2016   Absolute anemia 07/07/2015   Anxiety and depression 07/07/2015   Essential hypertension 07/07/2015   Acute anxiety 07/07/2015   Primary fibromyalgia syndrome 07/07/2015   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 [place] [address] Phone: [phone] Fax: [phone]     Social Determinants of Health (SDOH) Interventions    Readmission Risk Interventions     No data to display

## 2022-11-14 NOTE — Progress Notes (Signed)
[facility] PCCM  Transitioned to 3L [place] Doing well  Solumedrol > will stop  Change prednisone 20mg  daily x 3 days   PCCM will sign off  , MD [place] PCCM Pager: [phone] Cell: [phone] After 7:00 pm call Elink  [phone]

## 2022-11-14 NOTE — Progress Notes (Signed)
*  PRELIMINARY RESULTS* Echocardiogram 2D Echocardiogram has been performed.  Victoria Guzman 11/14/2022, 9:40 AM

## 2022-11-14 NOTE — Progress Notes (Signed)
Pt been off BIPAP since his am. BIPAP remains at bedside but pt does not feel like she needs it at this time.

## 2022-11-14 NOTE — Progress Notes (Signed)
   NAME:  ARIANE DITULLIO, MRN:  [id number], DOB:  October 17, 1966, LOS: 1 ADMISSION DATE:  11/13/2022, CONSULTATION DATE:  12/10 REFERRING MD:  Nelson Chimes CHIEF COMPLAINT:  Dyspnea   History of Present Illness:  56 y/o female with multiple medical problems admitted with dyspnea in the setting of Flu A and acute hypoxemic respiratory failure requiring BIPAP.    Pertinent  Medical History  HTN CKD 2-3, undetermined stage CVA Fibromylagia Chronic opiate dependence Anxiety and depression  Significant Hospital Events: Including procedures, antibiotic start and stop dates in addition to other pertinent events   12/10: Admitted to MedSurg unit with acute hypoxic respiratory failure secondary to influenza A.  Patient with progressive shortness of breath with increased oxygen requirement.  Placed on BiPAP and transferred to the ICU.  CTA Chest without infiltrates.  Repeat CXR lungs also normal.  PCCM consulted   Interim History / Subjective:  Coughing up thick mucus Throat hurts when she coughs Rested comfortably on BIPAP overnight  Objective   Blood pressure (!) 160/98, pulse 99, temperature 98.5 F (36.9 C), resp. rate 19, height 5\' 6"  (1.676 m), weight 79.4 kg, SpO2 100 %.    FiO2 (%):  [40 %] 40 %   Intake/Output Summary (Last 24 hours) at 11/14/2022 14/10/2022 Last data filed at 11/14/2022 0600 Gross per 24 hour  Intake 440 ml  Output 2200 ml  Net -1760 ml   Filed Weights   11/13/22 0122 11/13/22 1752  Weight: 79 kg 79.4 kg    Examination:  Gen: resting comfortably in bed on BIPAP HENT: OP clear, on BIPAP PULM: Crackles right lower lobe, otherwise clear, increased effort  CV: RRR, no mgr GI: BS+, soft, nontender Derm: no cyanosis or rash Psyche: normal mood and affect   Resolved Hospital Problem list     Assessment & Plan:  Acute hypoxemic respiratory failure due to influenza A virus> improving Acute bronchitis due to flu Sore throat from coughing Continue  solumedrol Continue duoneb Continue tamiflu Add guaifenesin Encourage plenty of fluids Add chloraseptic throat spray Morphine prn  Hypertension Elevated troponin > demand ischemia Tele Echo HCTZ  AKI  Monitor BMET and UOP Replace electrolytes as needed  Depression and anxiety Xanax, clonazepam per primary  Best Practice (right click and "Reselect all SmartList Selections" daily)   Diet/type: Regular consistency (see orders) DVT prophylaxis: LMWH GI prophylaxis: N/A Lines: N/A Foley:  N/A Code Status:  full code Last date of multidisciplinary goals of care discussion [admission]  Critical care time: 35 minutes     14/10/23, MD [place] PCCM Pager: [phone] Cell: [phone] After 7:00 pm call Elink  [phone]

## 2022-11-15 DIAGNOSIS — J101 Influenza due to other identified influenza virus with other respiratory manifestations: Secondary | ICD-10-CM | POA: Diagnosis not present

## 2022-11-15 LAB — BASIC METABOLIC PANEL
Anion gap: 8 (ref 5–15)
BUN: 29 mg/dL — ABNORMAL HIGH (ref 6–20)
CO2: 34 mmol/L — ABNORMAL HIGH (ref 22–32)
Calcium: 8.8 mg/dL — ABNORMAL LOW (ref 8.9–10.3)
Chloride: 94 mmol/L — ABNORMAL LOW (ref 98–111)
Creatinine, Ser: 1.25 mg/dL — ABNORMAL HIGH (ref 0.44–1.00)
GFR, Estimated: 51 mL/min — ABNORMAL LOW (ref >60)
Glucose, Bld: 121 mg/dL — ABNORMAL HIGH (ref 70–99)
Potassium: 3.2 mmol/L — ABNORMAL LOW (ref 3.5–5.1)
Sodium: 136 mmol/L (ref 135–145)

## 2022-11-15 LAB — CBC
HCT: 38.5 % (ref 36.0–46.0)
Hemoglobin: 12.5 g/dL (ref 12.0–15.0)
MCH: 27.2 pg (ref 26.0–34.0)
MCHC: 32.5 g/dL (ref 30.0–36.0)
MCV: 83.7 fL (ref 80.0–100.0)
Platelets: 215 10*3/uL (ref 150–400)
RBC: 4.6 MIL/uL (ref 3.87–5.11)
RDW: 14.6 % (ref 11.5–15.5)
WBC: 6.5 10*3/uL (ref 4.0–10.5)
nRBC: 0 % (ref 0.0–0.2)

## 2022-11-15 LAB — LEGIONELLA PNEUMOPHILA SEROGP 1 UR AG: L. pneumophila Serogp 1 Ur Ag: NEGATIVE

## 2022-11-15 LAB — MAGNESIUM: Magnesium: 1.8 mg/dL (ref 1.7–2.4)

## 2022-11-15 MED ORDER — MAGIC MOUTHWASH
5.0000 mL | Freq: Three times a day (TID) | ORAL | Status: DC
  Administered 2022-11-15 – 2022-11-17 (×5): 5 mL via ORAL
  Filled 2022-11-15 (×6): qty 5

## 2022-11-15 MED ORDER — LIDOCAINE VISCOUS HCL 2 % MT SOLN
5.0000 mL | Freq: Three times a day (TID) | OROMUCOSAL | Status: DC
  Administered 2022-11-15 – 2022-11-17 (×3): 5 mL via OROMUCOSAL
  Filled 2022-11-15 (×6): qty 15

## 2022-11-15 MED ORDER — PANTOPRAZOLE SODIUM 40 MG PO TBEC
40.0000 mg | DELAYED_RELEASE_TABLET | Freq: Once | ORAL | Status: AC
  Administered 2022-11-16: 40 mg via ORAL
  Filled 2022-11-15: qty 1

## 2022-11-15 MED ORDER — POTASSIUM CHLORIDE CRYS ER 20 MEQ PO TBCR
40.0000 meq | EXTENDED_RELEASE_TABLET | Freq: Once | ORAL | Status: AC
  Administered 2022-11-15: 40 meq via ORAL
  Filled 2022-11-15: qty 2

## 2022-11-15 MED ORDER — OXYCODONE-ACETAMINOPHEN 5-325 MG PO TABS
1.0000 | ORAL_TABLET | Freq: Four times a day (QID) | ORAL | Status: DC | PRN
  Administered 2022-11-16 – 2022-11-17 (×2): 1 via ORAL
  Filled 2022-11-15 (×3): qty 1

## 2022-11-15 MED ORDER — FLUCONAZOLE 50 MG PO TABS
150.0000 mg | ORAL_TABLET | Freq: Once | ORAL | Status: AC
  Administered 2022-11-15: 150 mg via ORAL
  Filled 2022-11-15: qty 1

## 2022-11-15 MED ORDER — MAGIC MOUTHWASH W/LIDOCAINE
5.0000 mL | Freq: Three times a day (TID) | ORAL | Status: DC
  Administered 2022-11-15 (×2): 5 mL via ORAL
  Filled 2022-11-15 (×3): qty 5

## 2022-11-15 MED ORDER — SODIUM CHLORIDE 3 % IN NEBU
4.0000 mL | INHALATION_SOLUTION | Freq: Two times a day (BID) | RESPIRATORY_TRACT | Status: DC
  Administered 2022-11-15 – 2022-11-17 (×5): 4 mL via RESPIRATORY_TRACT
  Filled 2022-11-15 (×6): qty 4

## 2022-11-15 MED ORDER — HYDROCOD POLI-CHLORPHE POLI ER 10-8 MG/5ML PO SUER
5.0000 mL | Freq: Two times a day (BID) | ORAL | Status: DC | PRN
  Administered 2022-11-15 – 2022-11-16 (×3): 5 mL via ORAL
  Filled 2022-11-15 (×3): qty 5

## 2022-11-15 MED ORDER — SODIUM CHLORIDE 0.9 % IV SOLN
2.0000 g | INTRAVENOUS | Status: DC
  Administered 2022-11-15 – 2022-11-17 (×3): 2 g via INTRAVENOUS
  Filled 2022-11-15 (×3): qty 20

## 2022-11-15 MED ORDER — MAGNESIUM SULFATE 2 GM/50ML IV SOLN
2.0000 g | Freq: Once | INTRAVENOUS | Status: AC
  Administered 2022-11-15: 2 g via INTRAVENOUS
  Filled 2022-11-15: qty 50

## 2022-11-15 NOTE — Progress Notes (Signed)
PROGRESS NOTE    Victoria Guzman  ZOX:[id number] DOB: 1966/07/06 DOA: 11/13/2022 PCP: Gracelyn NurseJohnston, John D, MD   Brief Narrative: 56 year old with past medical history significant for hypertension, CKD stage III, CVA, fibromyalgia, chronic pain on opioids admitted with acute hypoxic respiratory failure secondary to influenza A.  Patient respiratory failure got worse and she was transferred to the ICU for observation and management.   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  She developed worsening respiratory distress and was placed on BiPAP and transferred to ICU. -She has not required BiPAP since yesterday.  She is currently on 2 L of oxygen. -She continued to complain of worsening cough, thick phlegm, inability to cough with sputum out.  -Plan to start hypertonic saline nebulizer  twice daily -Will Add IV ceftriaxone to cover for underlying bacterial infection. -Diflucan prophylaxis for yeast infection.  -Tussionex BID PRN.  -Continue with Tamiflu Continue with guaifenesin, Duo-Nebs -on low dose prednisone.   Elevation of troponin: In the setting of respiratory failure.  Suspect demand ischemia Echo normal ejection fraction, no wall motion abnormality, diastolic dysfunction grade 1.   Hypertension: Hold hydrochlorothiazide and lisinopril.  Systolic blood pressure soft  AKI on CKD stage IIIa; Creatinine baseline 1.1 Creatinine peaked at 1.3   Oral candidiasis.  Start nystatin with  lidocaine.  Hypokalemia: Replete orally   Estimated body mass index is 28.25 kg/m as calculated from the following:   Height as of this encounter: 5\' 6"  (1.676 m).   Weight as of this encounter: 79.4 kg.   DVT prophylaxis: Lovenox Code Status: Full code Family Communication: sister at bedside.  Disposition Plan:  Status is: Inpatient Remains inpatient appropriate because: Management of Resp failure. Influenza. Home in 24 hours wean off oxygen.     Consultants:  CCM  Procedures:    Antimicrobials:    Subjective: She is still having constant cough and chest pain after  she cough. She is also complaining of sore throat. Her nurse has been weaning her off oxygen. She dint required BIPAP yesterday.   Objective: Vitals:   11/14/22 2135 11/14/22 2332 11/15/22 0504 11/15/22 0744  BP: 118/81 130/80 (!) 128/94 111/83  Pulse: (!) 101 (!) 104 85 84  Resp: (!) 27 18 16 14   Temp:  98 F (36.7 C) 97.9 F (36.6 C) 98.2 F (36.8 C)  TempSrc:      SpO2: 97% 100% 99% 100%  Weight:      Height:        Intake/Output Summary (Last 24 hours) at 11/15/2022 0812 Last data filed at 11/15/2022 0800 Gross per 24 hour  Intake 240 ml  Output 950 ml  Net -710 ml   Filed Weights   11/13/22 0122 11/13/22 1752  Weight: 79 kg 79.4 kg    Examination:  General exam: Appears calm and comfortable  Respiratory system: BL: ronchus.  Cardiovascular system: S1 & S2 heard, RRR.  Gastrointestinal system: Abdomen is nondistended, soft and nontender. No organomegaly or masses felt. Normal bowel sounds heard. Central  nervous system: Alert and oriented. No focal neurological deficits. Extremities: Symmetric 5 x 5 power. Skin: No rashes, lesions or ulcers Psychiatry: Judgement and insight appear normal. Mood & affect appropriate.     Data Reviewed: I have personally reviewed following labs and imaging studies  CBC: Recent Labs  Lab 11/13/22 0133 11/15/22 0338  WBC 7.5 6.5  HGB 14.0  12.5  HCT 43.3 38.5  MCV 85.1 83.7  PLT 258 215   Basic Metabolic Panel: Recent Labs  Lab 11/13/22 0133 11/15/22 0338  NA 139 136  K 3.3* 3.2*  CL 101 94*  CO2 26 34*  GLUCOSE 158* 121*  BUN 23* 29*  CREATININE 1.30* 1.25*  CALCIUM 9.2 8.8*  MG  --  1.8   GFR: Estimated Creatinine Clearance: 54.7 mL/min (A) (by C-G formula based on SCr of 1.25 mg/dL (H)). Liver Function Tests: No results for input(s): "AST", "ALT", "ALKPHOS", "BILITOT", "PROT", "ALBUMIN" in the last 168 hours. No results for input(s): "LIPASE", "AMYLASE" in the last 168 hours. No results for input(s): "AMMONIA" in the last 168 hours. Coagulation Profile: Recent Labs  Lab 11/13/22 0133  INR 1.0   Cardiac Enzymes: No results for input(s): "CKTOTAL", "CKMB", "CKMBINDEX", "TROPONINI" in the last 168 hours. BNP (last 3 results) No results for input(s): "PROBNP" in the last 8760 hours. HbA1C: No results for input(s): "HGBA1C" in the last 72 hours. CBG: Recent Labs  Lab 11/13/22 1754  GLUCAP 96   Lipid Profile: No results for input(s): "CHOL", "HDL", "LDLCALC", "TRIG", "CHOLHDL", "LDLDIRECT" in the last 72 hours. Thyroid Function Tests: No results for input(s): "TSH", "T4TOTAL", "FREET4", "T3FREE", "THYROIDAB" in the last 72 hours. Anemia Panel: No results for input(s): "VITAMINB12", "FOLATE", "FERRITIN", "TIBC", "IRON", "RETICCTPCT" in the last 72 hours. Sepsis Labs: Recent Labs  Lab 11/13/22 0108 11/13/22 1456 11/13/22 2145  PROCALCITON  --  0.10  --   LATICACIDVEN 1.5  --  0.8    Recent Results (from the past 240 hour(s))  Resp Panel by RT-PCR (Flu A&B, Covid) Anterior Nasal Swab     Status: Abnormal   Collection Time: 11/13/22  1:23 AM   Specimen: Anterior Nasal Swab  Result Value Ref Range Status   SARS Coronavirus 2 by RT PCR NEGATIVE NEGATIVE Final    Comment: (NOTE) SARS-CoV-2 target nucleic acids are NOT DETECTED.  The SARS-CoV-2 RNA is generally detectable in upper  respiratory specimens during the acute phase of infection. The lowest concentration of SARS-CoV-2 viral copies this assay can detect is 138 copies/mL. A negative result does not preclude SARS-Cov-2 infection and should not be used as the sole basis for treatment or other patient management decisions. A negative result may occur with  improper specimen collection/handling, submission of specimen other than nasopharyngeal swab, presence of viral mutation(s) within the areas targeted by this assay, and inadequate number of viral copies(<138 copies/mL). A negative result must be combined with clinical observations, patient history, and epidemiological information. The expected result is Negative.  Fact Sheet for Patients:  BloggerCourse.com  Fact Sheet for Healthcare Providers:  SeriousBroker.it  This test is no t yet approved or cleared by the [place] FDA and  has been authorized for detection and/or diagnosis of SARS-CoV-2 by FDA under an Emergency Use Authorization (EUA). This EUA will remain  in effect (meaning this test can be used) for the duration of the COVID-19 declaration under Section 564(b)(1) of the Act, 21 U.S.C.section 360bbb-3(b)(1), unless the authorization is terminated  or revoked sooner.  Influenza A by PCR POSITIVE (A) NEGATIVE Final   Influenza B by PCR NEGATIVE NEGATIVE Final    Comment: (NOTE) The Xpert Xpress SARS-CoV-2/FLU/RSV plus assay is intended as an aid in the diagnosis of influenza from Nasopharyngeal swab specimens and should not be used as a sole basis for treatment. Nasal washings and aspirates are unacceptable for Xpert Xpress SARS-CoV-2/FLU/RSV testing.  Fact Sheet for Patients: BloggerCourse.com  Fact Sheet for Healthcare Providers: SeriousBroker.it  This test is not yet approved or cleared by the [place] FDA and has been  authorized for detection and/or diagnosis of SARS-CoV-2 by FDA under an Emergency Use Authorization (EUA). This EUA will remain in effect (meaning this test can be used) for the duration of the COVID-19 declaration under Section 564(b)(1) of the Act, 21 U.S.C. section 360bbb-3(b)(1), unless the authorization is terminated or revoked.  Performed at [facility], [address] [place], [place] [address]   Blood Culture (routine x 2)     Status: None (Preliminary result)   Collection Time: 11/13/22  3:00 AM   Specimen: BLOOD RIGHT ARM  Result Value Ref Range Status   Specimen Description BLOOD RIGHT ARM  Final   Special Requests Blood Culture adequate volume  Final   Culture   Final    NO GROWTH 1 DAY Performed at [facility], [address], [place], [place] [address]    Report Status PENDING  Incomplete  Blood Culture (routine x 2)     Status: None (Preliminary result)   Collection Time: 11/13/22  3:00 AM   Specimen: BLOOD LEFT ARM  Result Value Ref Range Status   Specimen Description BLOOD LEFT ARM  Final   Special Requests Blood Culture adequate volume  Final   Culture   Final    NO GROWTH 1 DAY Performed at [facility], [address], [place], [place] [address]    Report Status PENDING  Incomplete  MRSA Next Gen by PCR, Nasal     Status: None   Collection Time: 11/13/22  5:54 PM   Specimen: Nasal Mucosa; Nasal Swab  Result Value Ref Range Status   MRSA by PCR Next Gen NOT DETECTED NOT DETECTED Final    Comment: (NOTE) The GeneXpert MRSA Assay (FDA approved for NASAL specimens only), is one component of a comprehensive MRSA colonization surveillance program. It is not intended to diagnose MRSA infection nor to guide or monitor treatment for MRSA infections. Test performance is not FDA approved in patients less than 32 years old. Performed at [facility], [address] [place], [place] [address]          Radiology  Studies: ECHOCARDIOGRAM COMPLETE  Result Date: 11/14/2022    ECHOCARDIOGRAM REPORT   Patient Name:   BERKLIE DETHLEFS Date of Exam: 11/14/2022 Medical Rec #:  [id number]         Height:       66.0 in Accession #:    [id number]        Weight:       175.0 lb Date of Birth:  06-24-1966         BSA:          1.890 m Patient Age:    56 years          BP:           173/91 mmHg Patient Gender: F                 HR:  108 bpm. Exam Location:  [facility] Procedure: 2D Echo, Color Doppler and Cardiac Doppler Indications:     Elevated troponin  History:         Patient has prior history of Echocardiogram examinations, most                  recent 04/24/2014. Stroke and CKD, Signs/Symptoms:Shortness of                  Breath; Risk Factors:Hypertension.  Sonographer:     Humphrey Rolls Referring Phys:  [id number] AMIN Diagnosing Phys: Lorine Bears MD  Sonographer Comments: No subcostal window. IMPRESSIONS  1. Left ventricular ejection fraction, by estimation, is 60 to 65%. The left ventricle has normal function. The left ventricle has no regional wall motion abnormalities. There is mild left ventricular hypertrophy. Left ventricular diastolic parameters are consistent with Grade I diastolic dysfunction (impaired relaxation).  2. Right ventricular systolic function is normal. The right ventricular size is normal. Tricuspid regurgitation signal is inadequate for assessing PA pressure.  3. The mitral valve is normal in structure. No evidence of mitral valve regurgitation. No evidence of mitral stenosis.  4. The aortic valve was not well visualized. There is mild calcification of the aortic valve. Aortic valve regurgitation is not visualized. Mild aortic valve stenosis. Aortic valve area, by VTI measures 2.02 cm. Aortic valve mean gradient measures 10.0  mmHg.  5. The inferior vena cava is normal in size with greater than 50% respiratory variability, suggesting right atrial pressure of 3 mmHg. FINDINGS  Left Ventricle:  Left ventricular ejection fraction, by estimation, is 60 to 65%. The left ventricle has normal function. The left ventricle has no regional wall motion abnormalities. The left ventricular internal cavity size was normal in size. There is  mild left ventricular hypertrophy. Left ventricular diastolic parameters are consistent with Grade I diastolic dysfunction (impaired relaxation). Right Ventricle: The right ventricular size is normal. No increase in right ventricular wall thickness. Right ventricular systolic function is normal. Tricuspid regurgitation signal is inadequate for assessing PA pressure. Left Atrium: Left atrial size was normal in size. Right Atrium: Right atrial size was normal in size. Pericardium: There is no evidence of pericardial effusion. Mitral Valve: The mitral valve is normal in structure. No evidence of mitral valve regurgitation. No evidence of mitral valve stenosis. Tricuspid Valve: The tricuspid valve is normal in structure. Tricuspid valve regurgitation is not demonstrated. No evidence of tricuspid stenosis. Aortic Valve: The aortic valve was not well visualized. There is mild calcification of the aortic valve. Aortic valve regurgitation is not visualized. Mild aortic stenosis is present. Aortic valve mean gradient measures 10.0 mmHg. Aortic valve peak gradient measures 17.0 mmHg. Aortic valve area, by VTI measures 2.02 cm. Pulmonic Valve: The pulmonic valve was normal in structure. Pulmonic valve regurgitation is not visualized. No evidence of pulmonic stenosis. Aorta: The aortic root is normal in size and structure. Venous: The inferior vena cava is normal in size with greater than 50% respiratory variability, suggesting right atrial pressure of 3 mmHg. IAS/Shunts: No atrial level shunt detected by color flow Doppler.  LEFT VENTRICLE PLAX 2D LVIDd:         3.40 cm   Diastology LVIDs:         2.00 cm   LV e' medial:    9.03 cm/s LV PW:         1.10 cm   LV E/e' medial:  7.7 LV IVS:  0.90 cm   LV e' lateral:   8.27 cm/s LVOT diam:     2.00 cm   LV E/e' lateral: 8.4 LV SV:         69 LV SV Index:   36 LVOT Area:     3.14 cm  RIGHT VENTRICLE RV Basal diam:  2.10 cm RV S prime:     12.10 cm/s LEFT ATRIUM             Index        RIGHT ATRIUM          Index LA diam:        3.00 cm 1.59 cm/m   RA Area:     7.33 cm LA Vol (A2C):   28.4 ml 15.03 ml/m  RA Volume:   13.20 ml 6.99 ml/m LA Vol (A4C):   28.4 ml 15.03 ml/m LA Biplane Vol: 29.8 ml 15.77 ml/m  AORTIC VALVE                     PULMONIC VALVE AV Area (Vmax):    1.69 cm      PV Vmax:       1.03 m/s AV Area (Vmean):   1.90 cm      PV Vmean:      70.300 cm/s AV Area (VTI):     2.02 cm      PV VTI:        0.162 m AV Vmax:           206.00 cm/s   PV Peak grad:  4.2 mmHg AV Vmean:          147.000 cm/s  PV Mean grad:  2.0 mmHg AV VTI:            0.340 m AV Peak Grad:      17.0 mmHg AV Mean Grad:      10.0 mmHg LVOT Vmax:         111.00 cm/s LVOT Vmean:        88.700 cm/s LVOT VTI:          0.219 m LVOT/AV VTI ratio: 0.64  AORTA Ao Root diam: 2.80 cm MITRAL VALVE MV Area (PHT): 7.90 cm     SHUNTS MV Decel Time: 96 msec      Systemic VTI:  0.22 m MV E velocity: 69.70 cm/s   Systemic Diam: 2.00 cm MV A velocity: 117.00 cm/s MV E/A ratio:  0.60 Lorine Bears MD Electronically signed by Lorine Bears MD Signature Date/Time: 11/14/2022/11:24:11 AM    Final    DG Chest Port 1 View  Result Date: 11/14/2022 CLINICAL DATA:  [id number] Acute respiratory failure with hypoxemia (HCC) [id number] EXAM: PORTABLE CHEST 1 VIEW COMPARISON:  Chest radiograph from one day prior. FINDINGS: Stable cardiomediastinal silhouette with normal heart size. No pneumothorax. No pleural effusion. Lungs appear clear, with no acute consolidative airspace disease and no pulmonary edema. IMPRESSION: No active disease. Electronically Signed   By: Delbert Phenix M.D.   On: 11/14/2022 09:19   DG Chest 1 View  Result Date: 11/13/2022 CLINICAL DATA:  Shortness of breath EXAM:  CHEST  1 VIEW COMPARISON:  11/13/2022 1:34 a.m. FINDINGS: The heart size and mediastinal contours are within normal limits. Both lungs are clear. The visualized skeletal structures are unremarkable. IMPRESSION: No active disease. Electronically Signed   By: Sharlet Salina M.D.   On: 11/13/2022 20:27        Scheduled Meds:  acetaminophen  650 mg Oral Q6H   budesonide (PULMICORT) nebulizer solution  0.5 mg Nebulization BID   Chlorhexidine Gluconate Cloth  6 each Topical Q0600   clonazePAM  1 mg Oral BID   enoxaparin (LOVENOX) injection  40 mg Subcutaneous Q24H   guaiFENesin  1,200 mg Oral BID   hydrochlorothiazide  12.5 mg Oral Daily   ipratropium-albuterol  3 mL Nebulization TID   lisinopril  20 mg Oral Daily   oseltamivir  30 mg Oral BID   pantoprazole  40 mg Oral Daily   predniSONE  20 mg Oral Q breakfast   tiZANidine  4 mg Oral TID   Continuous Infusions:   LOS: 2 days    Time spent: 35 minutes    Jager Koska A Amandamarie Feggins, MD [facility]   If 7PM-7AM, please contact night-coverage [email or web address]  11/15/2022, 8:12 AM

## 2022-11-16 MED ORDER — HYDROCHLOROTHIAZIDE 12.5 MG PO TABS
12.5000 mg | ORAL_TABLET | Freq: Two times a day (BID) | ORAL | Status: DC
  Administered 2022-11-16 – 2022-11-17 (×3): 12.5 mg via ORAL
  Filled 2022-11-16 (×3): qty 1

## 2022-11-16 MED ORDER — LISINOPRIL 20 MG PO TABS
20.0000 mg | ORAL_TABLET | Freq: Two times a day (BID) | ORAL | Status: DC
  Administered 2022-11-16 – 2022-11-17 (×3): 20 mg via ORAL
  Filled 2022-11-16 (×3): qty 1

## 2022-11-16 MED ORDER — LISINOPRIL-HYDROCHLOROTHIAZIDE 20-12.5 MG PO TABS: 1.0000 | ORAL_TABLET | Freq: Two times a day (BID) | ORAL | Status: DC

## 2022-11-16 NOTE — Progress Notes (Signed)
PROGRESS NOTE    Victoria Guzman  XKG:[id number]  DOB: 09/30/1966  DOA: 11/13/2022 PCP: Gracelyn Nurse, MD Outpatient Specialists:   Hospital course:  56 year old female with HTN, CKD 3, CVA 215, fibromyalgia and chronic pain on opiates was admitted yesterday with acute hypoxic respiratory failure secondary to influenza A.  She worsened spent 1 night in the ICU on BiPAP.  She was transferred out yesterday.  Subjective:  Patient feels she is doing better, notes that she can breathe better however notes she is still very weak.  Patient states she is very sad that her 50-year-old granddaughter has developed the flu as well as her son most likely.  Patient is worried because they do not have insurance and she does not know how they are going to get care.  She is just overall worried about the flu.   Objective: Vitals:   11/16/22 0755 11/16/22 0943 11/16/22 1200 11/16/22 1700  BP: (!) 165/95 (!) 155/98 138/89 (!) 136/99  Pulse: 100  (!) 104 95  Resp: 18 20  18   Temp: 98.5 F (36.9 C)  98.6 F (37 C) 97.8 F (36.6 C)  TempSrc: Oral  Oral Oral  SpO2: 92% 91% 93% 95%  Weight:      Height:        Intake/Output Summary (Last 24 hours) at 11/16/2022 1752 Last data filed at 11/16/2022 1500 Gross per 24 hour  Intake 680 ml  Output --  Net 680 ml    Filed Weights   11/13/22 0122 11/13/22 1752  Weight: 79 kg 79.4 kg     Exam:  General: Tearful patient lying in bed in no respiratory distress with a hoarse voice and occasional nonproductive cough Eyes: sclera anicteric, conjuctiva mild injection bilaterally CVS: S1-S2, regular  Respiratory:  decreased air entry bilaterally secondary to decreased inspiratory effort, rales at bases  GI: NABS, soft, NT  LE: Warm and well-perfused Neuro: A/O x 3,  grossly nonfocal.   Data Reviewed:  Basic Metabolic Panel: Recent Labs  Lab 11/13/22 0133 11/15/22 0338  NA 139 136  K 3.3* 3.2*  CL 101 94*  CO2 26 34*  GLUCOSE 158*  121*  BUN 23* 29*  CREATININE 1.30* 1.25*  CALCIUM 9.2 8.8*  MG  --  1.8     CBC: Recent Labs  Lab 11/13/22 0133 11/15/22 0338  WBC 7.5 6.5  HGB 14.0 12.5  HCT 43.3 38.5  MCV 85.1 83.7  PLT 258 215      Scheduled Meds:  acetaminophen  650 mg Oral Q6H   budesonide (PULMICORT) nebulizer solution  0.5 mg Nebulization BID   Chlorhexidine Gluconate Cloth  6 each Topical Q0600   clonazePAM  1 mg Oral BID   enoxaparin (LOVENOX) injection  40 mg Subcutaneous Q24H   guaiFENesin  1,200 mg Oral BID   lisinopril  20 mg Oral BID   And   hydrochlorothiazide  12.5 mg Oral BID   ipratropium-albuterol  3 mL Nebulization TID   magic mouthwash  5 mL Oral TID   And   lidocaine  5 mL Mouth/Throat TID   oseltamivir  30 mg Oral BID   pantoprazole  40 mg Oral Daily   predniSONE  20 mg Oral Q breakfast   sodium chloride HYPERTONIC  4 mL Nebulization BID   tiZANidine  4 mg Oral TID   Continuous Infusions:  cefTRIAXone (ROCEPHIN)  IV 2 g (11/16/22 1030)     Assessment & Plan:   Acute hypoxic  respiratory failure due to influenza A Patient is improved with steroids, Tamiflu and inhaled bronchodilators Patient has been transitioned over to prednisone from Solu-Medrol She remains weak, will request PT evaluation Continue Tamiflu and DuoNebs  Anxiety over influenza A Patient is very distressed over the fact that she may have given her family influenza.  Discussed with her that it is not her fault and it is very common to have the flu and not know that you have it. I also attempted to contact patient's daughter Victoria Guzman x 2 without being able to do so, message left on voicemail.  Elevated troponin To be secondary to demand ischemia  HTN Lisinopril and HCTZ restarted per home doses  AKI Recheck in the morning after hydration    Studies: No results found.  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

## 2022-11-16 NOTE — Progress Notes (Signed)
Pt has BP of 165/95. Rechecked BP,155/98 Labetalol PRN was not given, MD was ask if home medication Lisinopril needs to be restarted, last night pt also received Labetalol PRN, and will refused Labetalol as it made her more anxious. Pt also refused Zanaflex and Klonopin.

## 2022-11-17 LAB — BASIC METABOLIC PANEL
Anion gap: 8 (ref 5–15)
BUN: 22 mg/dL — ABNORMAL HIGH (ref 6–20)
CO2: 29 mmol/L (ref 22–32)
Calcium: 8.6 mg/dL — ABNORMAL LOW (ref 8.9–10.3)
Chloride: 99 mmol/L (ref 98–111)
Creatinine, Ser: 1.14 mg/dL — ABNORMAL HIGH (ref 0.44–1.00)
GFR, Estimated: 57 mL/min — ABNORMAL LOW (ref >60)
Glucose, Bld: 112 mg/dL — ABNORMAL HIGH (ref 70–99)
Potassium: 3.9 mmol/L (ref 3.5–5.1)
Sodium: 136 mmol/L (ref 135–145)

## 2022-11-17 MED ORDER — IPRATROPIUM-ALBUTEROL 0.5-2.5 (3) MG/3ML IN SOLN: 3.0000 mL | Freq: Two times a day (BID) | RESPIRATORY_TRACT | Status: DC

## 2022-11-17 MED ORDER — HYDROCOD POLI-CHLORPHE POLI ER 10-8 MG/5ML PO SUER: 5.0000 mL | Freq: Two times a day (BID) | ORAL | 0 refills | Status: AC | PRN

## 2022-11-17 MED ORDER — ALBUTEROL SULFATE HFA 108 (90 BASE) MCG/ACT IN AERS: 2.0000 | INHALATION_SPRAY | RESPIRATORY_TRACT | 2 refills | Status: AC | PRN

## 2022-11-17 MED ORDER — FLUCONAZOLE 50 MG PO TABS
150.0000 mg | ORAL_TABLET | Freq: Once | ORAL | Status: AC
  Administered 2022-11-17: 150 mg via ORAL
  Filled 2022-11-17: qty 1

## 2022-11-17 NOTE — Progress Notes (Signed)
PT Cancellation Note  Patient Details Name: Victoria Guzman MRN: [id number] DOB: 26-Jul-1966   Cancelled Treatment:    Reason Eval/Treat Not Completed: PT screened, no needs identified, will sign off (Order recieved, chart reviewed. Author visits with pt this morning to gather info on PLOF, current mobility tolerance.) Pt seemed overwhelmed at time of visit, some decreased frustration tolerance noted. Author elected to leave and return to room later. Author returns to room at 1425. Pt is in BR performing pericare. Pt reports she has been mobilizing in room independently without device, denies any acute concerns regarding her ability to safely mobilize at DC and return to home, denies any acute DME needs to achieve independence in mobility or caregiver assist. Pt does not appear to have any acute PT needs at this time, will signoff. Pt/RN report DC is in process, will be leaving soon when son arrives for transport.   2:35 PM, 11/17/22 Rosamaria Lints, PT, DPT Physical Therapist - [facility]  [phone] (ASCOM)     [name] 11/17/2022, 2:33 PM

## 2022-11-17 NOTE — Discharge Summary (Signed)
Physician Discharge Summary  Victoria Guzman [id number] DOB: [birth year] DOA: 11/13/2022  PCP: Victoria Hire, MD  Admit date: 11/13/2022 Discharge date: 11/17/2022  Admitted From: Home Disposition: Home  Recommendations for Outpatient Follow-up:  Follow up with PCP in 1-2 weeks   Home Health: Not applicable Equipment/Devices: Not applicable  Discharge Condition: Stable CODE STATUS: Full code Diet recommendation: Low-salt diet  Discharge summary: 56 year old with history of hypertension, CKD stage IIIa, fibromyalgia and chronic pain syndrome was admitted to the hospital with 2 to 3 days of shortness of breath and wheezing.  She was found to have influenza A infection.  She had more shortness of breath requiring BiPAP overnight on the day of admit however remains on room air since then. CT angiogram was without evidence of pneumonia, pulmonary embolism. 2D echocardiogram was normal.  Normal ejection fraction.  Acute hypoxemic respiratory failure due to influenza A infection, resolved. On room air now.  No active disease.  Received steroids, Tamiflu, IV antibiotics for 5 days.  Completed therapy of Tamiflu and antibiotics. Symptomatic management.  Over-the-counter cough medications.  She was prescribed a short course of Hycodan cough syrup with instructions.  Rescue inhalers.  Essential hypertension: Blood pressure stable.  Resume lisinopril and hydrochlorothiazide.  Chronic pain syndrome and fibromyalgia: On Percocet and Zanaflex.  Medically stable for discharge.   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 Felt depressed while on medication.   Zofran [ondansetron Hcl] Itching, Swelling, Other (See Comments)   swelling of lips        Medication List     STOP taking these medications    baclofen 10 MG tablet Commonly known as: LIORESAL   cephALEXin 500 MG capsule Commonly known as: Keflex   clonazePAM 1 MG tablet Commonly known as: KlonoPIN   predniSONE 10 MG tablet Commonly known as: DELTASONE       TAKE these medications    acetaminophen 650 MG CR tablet Commonly known as: TYLENOL Take 1,300 mg by mouth every 8 (eight) hours as needed for pain.   albuterol 108 (90 Base) MCG/ACT inhaler Commonly known as: ProAir HFA Inhale 2 puffs into the lungs every 4 (four) hours as needed for wheezing or shortness of breath.   chlorpheniramine-HYDROcodone 10-8 MG/5ML Commonly known as: TUSSIONEX Take 5 mLs by mouth every 12 (twelve) hours as needed for up to 12 days for cough.   lisinopril-hydrochlorothiazide 20-12.5 MG tablet Commonly known as: ZESTORETIC Take 1 tablet by mouth 2 (two) times daily.   oxyCODONE-acetaminophen 5-325 MG tablet Commonly known as: PERCOCET/ROXICET Take 1 tablet by mouth every 6 (six) hours as needed.   tiZANidine 4  MG capsule Commonly known as: Zanaflex Take 1 capsule (4 mg total) by mouth 3 (three) times daily.   valACYclovir 1000 MG tablet Commonly known as: VALTREX Take 1,000 mg by mouth See admin instructions. Take 1000mg  by mouth twice a day for 7 days as needed for outbreaks   zolpidem 10 MG tablet Commonly known as:  AMBIEN Take 10 mg by mouth at bedtime.        Allergies  Allergen Reactions   Clonidine Derivatives Hives   Quinolones Hives and Swelling   Trazodone Other (See Comments)    Caused sedation. Felt depressed while on medication.   Zofran [Ondansetron Hcl] Itching, Swelling and Other (See Comments)    swelling of lips    Consultations: Pulmonary critical care   Procedures/Studies: ECHOCARDIOGRAM COMPLETE  Result Date: 11/14/2022    ECHOCARDIOGRAM REPORT   Patient Name:   Victoria Guzman Date of Exam: 11/14/2022 Medical Rec #:  [id number]         Height:       66.0 in Accession #:    [id number]        Weight:       175.0 lb Date of Birth:  05/07/1966         BSA:          1.890 m Patient Age:    32 years          BP:           173/91 mmHg Patient Gender: F                 HR:           108 bpm. Exam Location:  [facility] Procedure: 2D Echo, Color Doppler and Cardiac Doppler Indications:     Elevated troponin  History:         Patient has prior history of Echocardiogram examinations, most                  recent 04/24/2014. Stroke and CKD, Signs/Symptoms:Shortness of                  Breath; Risk Factors:Hypertension.  Sonographer:     Charmayne Sheer Referring Phys:  [id number] AMIN Diagnosing Phys: Kathlyn Sacramento MD  Sonographer Comments: No subcostal window. IMPRESSIONS  1. Left ventricular ejection fraction, by estimation, is 60 to 65%. The left ventricle has normal function. The left ventricle has no regional wall motion abnormalities. There is mild left ventricular hypertrophy. Left ventricular diastolic parameters are consistent with Grade I diastolic dysfunction (impaired relaxation).  2. Right ventricular systolic function is normal. The right ventricular size is normal. Tricuspid regurgitation signal is inadequate for assessing PA pressure.  3. The mitral valve is normal in structure. No evidence of mitral valve regurgitation. No evidence of mitral stenosis.  4. The aortic valve was not well  visualized. There is mild calcification of the aortic valve. Aortic valve regurgitation is not visualized. Mild aortic valve stenosis. Aortic valve area, by VTI measures 2.02 cm. Aortic valve mean gradient measures 10.0  mmHg.  5. The inferior vena cava is normal in size with greater than 50% respiratory variability, suggesting right atrial pressure of 3 mmHg. FINDINGS  Left Ventricle: Left ventricular ejection fraction, by estimation, is 60 to 65%. The left ventricle has normal function. The left ventricle has no regional wall motion abnormalities. The left ventricular internal cavity size was normal in size. There is  mild left ventricular hypertrophy. Left ventricular diastolic parameters  are consistent with Grade I diastolic dysfunction (impaired relaxation). Right Ventricle: The right ventricular size is normal. No increase in right ventricular wall thickness. Right ventricular systolic function is normal. Tricuspid regurgitation signal is inadequate for assessing PA pressure. Left Atrium: Left atrial size was normal in size. Right Atrium: Right atrial size was normal in size. Pericardium: There is no evidence of pericardial effusion. Mitral Valve: The mitral valve is normal in structure. No evidence of mitral valve regurgitation. No evidence of mitral valve stenosis. Tricuspid Valve: The tricuspid valve is normal in structure. Tricuspid valve regurgitation is not demonstrated. No evidence of tricuspid stenosis. Aortic Valve: The aortic valve was not well visualized. There is mild calcification of the aortic valve. Aortic valve regurgitation is not visualized. Mild aortic stenosis is present. Aortic valve mean gradient measures 10.0 mmHg. Aortic valve peak gradient measures 17.0 mmHg. Aortic valve area, by VTI measures 2.02 cm. Pulmonic Valve: The pulmonic valve was normal in structure. Pulmonic valve regurgitation is not visualized. No evidence of pulmonic stenosis. Aorta: The aortic root is normal in size  and structure. Venous: The inferior vena cava is normal in size with greater than 50% respiratory variability, suggesting right atrial pressure of 3 mmHg. IAS/Shunts: No atrial level shunt detected by color flow Doppler.  LEFT VENTRICLE PLAX 2D LVIDd:         3.40 cm   Diastology LVIDs:         2.00 cm   LV e' medial:    9.03 cm/s LV PW:         1.10 cm   LV E/e' medial:  7.7 LV IVS:        0.90 cm   LV e' lateral:   8.27 cm/s LVOT diam:     2.00 cm   LV E/e' lateral: 8.4 LV SV:         69 LV SV Index:   36 LVOT Area:     3.14 cm  RIGHT VENTRICLE RV Basal diam:  2.10 cm RV S prime:     12.10 cm/s LEFT ATRIUM             Index        RIGHT ATRIUM          Index LA diam:        3.00 cm 1.59 cm/m   RA Area:     7.33 cm LA Vol (A2C):   28.4 ml 15.03 ml/m  RA Volume:   13.20 ml 6.99 ml/m LA Vol (A4C):   28.4 ml 15.03 ml/m LA Biplane Vol: 29.8 ml 15.77 ml/m  AORTIC VALVE                     PULMONIC VALVE AV Area (Vmax):    1.69 cm      PV Vmax:       1.03 m/s AV Area (Vmean):   1.90 cm      PV Vmean:      70.300 cm/s AV Area (VTI):     2.02 cm      PV VTI:        0.162 m AV Vmax:           206.00 cm/s   PV Peak grad:  4.2 mmHg AV Vmean:          147.000 cm/s  PV Mean grad:  2.0 mmHg AV VTI:            0.340 m AV Peak Grad:  17.0 mmHg AV Mean Grad:      10.0 mmHg LVOT Vmax:         111.00 cm/s LVOT Vmean:        88.700 cm/s LVOT VTI:          0.219 m LVOT/AV VTI ratio: 0.64  AORTA Ao Root diam: 2.80 cm MITRAL VALVE MV Area (PHT): 7.90 cm     SHUNTS MV Decel Time: 96 msec      Systemic VTI:  0.22 m MV E velocity: 69.70 cm/s   Systemic Diam: 2.00 cm MV A velocity: 117.00 cm/s MV E/A ratio:  0.60 Kathlyn Sacramento MD Electronically signed by Kathlyn Sacramento MD Signature Date/Time: 11/14/2022/11:24:11 AM    Final    DG Chest Port 1 View  Result Date: 11/14/2022 CLINICAL DATA:  [id number] Acute respiratory failure with hypoxemia ([place]) [id number] EXAM: PORTABLE CHEST 1 VIEW COMPARISON:  Chest radiograph from one day  prior. FINDINGS: Stable cardiomediastinal silhouette with normal heart size. No pneumothorax. No pleural effusion. Lungs appear clear, with no acute consolidative airspace disease and no pulmonary edema. IMPRESSION: No active disease. Electronically Signed   By: Ilona Sorrel M.D.   On: 11/14/2022 09:19   DG Chest 1 View  Result Date: 11/13/2022 CLINICAL DATA:  Shortness of breath EXAM: CHEST  1 VIEW COMPARISON:  11/13/2022 1:34 a.m. FINDINGS: The heart size and mediastinal contours are within normal limits. Both lungs are clear. The visualized skeletal structures are unremarkable. IMPRESSION: No active disease. Electronically Signed   By: Randa Ngo M.D.   On: 11/13/2022 20:27   CT Angio Chest PE W/Cm &/Or Wo Cm  Result Date: 11/13/2022 CLINICAL DATA:  Shortness of breath for 2 days with hypoxia, initial encounter EXAM: CT ANGIOGRAPHY CHEST WITH CONTRAST TECHNIQUE: Multidetector CT imaging of the chest was performed using the standard protocol during bolus administration of intravenous contrast. Multiplanar CT image reconstructions and MIPs were obtained to evaluate the vascular anatomy. RADIATION DOSE REDUCTION: This exam was performed according to the departmental dose-optimization program which includes automated exposure control, adjustment of the mA and/or kV according to patient size and/or use of iterative reconstruction technique. CONTRAST:  67mL OMNIPAQUE IOHEXOL 350 MG/ML SOLN COMPARISON:  Chest x-ray from earlier in the same day. FINDINGS: Cardiovascular: Thoracic aorta shows no aneurysmal dilatation or dissection. No cardiac enlargement is noted. No coronary calcifications are seen. The pulmonary artery shows a normal branching pattern bilaterally. No filling defect to suggest pulmonary embolism is identified. Mediastinum/Nodes: Thoracic inlet is within normal limits. No hilar or mediastinal adenopathy is noted. The esophagus as visualized is within normal limits. Lungs/Pleura: Lungs are  well aerated bilaterally. No focal infiltrate or sizable effusion is seen. No parenchymal nodules are noted. Upper Abdomen: Visualized upper abdomen is within normal limits. Musculoskeletal: Degenerative changes of the thoracic spine are noted. No acute rib abnormality is seen. Review of the MIP images confirms the above findings. IMPRESSION: No evidence of pulmonary emboli. No acute abnormality noted. Electronically Signed   By: Inez Catalina M.D.   On: 11/13/2022 02:37   DG Chest Port 1 View  Result Date: 11/13/2022 CLINICAL DATA:  Shortness of breath. EXAM: PORTABLE CHEST 1 VIEW COMPARISON:  Chest and rib radiographs 10/23/2020 FINDINGS: The cardiomediastinal contours are normal. The lungs are clear. Pulmonary vasculature is normal. No consolidation, pleural effusion, or pneumothorax. Thoracic spondylosis. No acute osseous abnormalities are seen. IMPRESSION: No acute chest findings. Electronically Signed   By: Keith Rake M.D.   On: 11/13/2022 01:42   (  Echo, Carotid, EGD, Colonoscopy, ERCP)    Subjective: Patient seen and examined.  On room air.  Patient tells me that she was able to get to the bathroom and walk around the room.  On room air.  Sometimes he coughs.  Extremely anxious.  She is asking why she is weak.  Comfortable with the idea of going home.  She is very upset that she got influenza and that makes her very weak.   Discharge Exam: Vitals:   11/17/22 0421 11/17/22 0819  BP: (!) 142/99 (!) 144/70  Pulse: 82 85  Resp: 18 20  Temp: 98 F (36.7 C) 97.6 F (36.4 C)  SpO2: 96% 94%   Vitals:   11/16/22 2325 11/17/22 0000 11/17/22 0421 11/17/22 0819  BP: (!) 153/101 (!) 155/98 (!) 142/99 (!) 144/70  Pulse: 98  82 85  Resp:   18 20  Temp:   98 F (36.7 C) 97.6 F (36.4 C)  TempSrc:   Oral Oral  SpO2: 96%  96% 94%  Weight:      Height:        General: Pt is alert, awake, not in acute distress Patient is on room air.  Looks comfortable.  She is anxious.    Cardiovascular: RRR, S1/S2 +, no rubs, no gallops Respiratory: CTA bilaterally, no wheezing, no rhonchi Abdominal: Soft, NT, ND, bowel sounds + Extremities: no edema, no cyanosis    The results of significant diagnostics from this hospitalization (including imaging, microbiology, ancillary and laboratory) are listed below for reference.     Microbiology: Recent Results (from the past 240 hour(s))  Resp Panel by RT-PCR (Flu A&B, Covid) Anterior Nasal Swab     Status: Abnormal   Collection Time: 11/13/22  1:23 AM   Specimen: Anterior Nasal Swab  Result Value Ref Range Status   SARS Coronavirus 2 by RT PCR NEGATIVE NEGATIVE Final    Comment: (NOTE) SARS-CoV-2 target nucleic acids are NOT DETECTED.  The SARS-CoV-2 RNA is generally detectable in upper respiratory specimens during the acute phase of infection. The lowest concentration of SARS-CoV-2 viral copies this assay can detect is 138 copies/mL. A negative result does not preclude SARS-Cov-2 infection and should not be used as the sole basis for treatment or other patient management decisions. A negative result may occur with  improper specimen collection/handling, submission of specimen other than nasopharyngeal swab, presence of viral mutation(s) within the areas targeted by this assay, and inadequate number of viral copies(<138 copies/mL). A negative result must be combined with clinical observations, patient history, and epidemiological information. The expected result is Negative.  Fact Sheet for Patients:  EntrepreneurPulse.com.au  Fact Sheet for Healthcare Providers:  IncredibleEmployment.be  This test is no t yet approved or cleared by the [place] FDA and  has been authorized for detection and/or diagnosis of SARS-CoV-2 by FDA under an Emergency Use Authorization (EUA). This EUA will remain  in effect (meaning this test can be used) for the duration of the COVID-19  declaration under Section 564(b)(1) of the Act, 21 U.S.C.section 360bbb-3(b)(1), unless the authorization is terminated  or revoked sooner.       Influenza A by PCR POSITIVE (A) NEGATIVE Final   Influenza B by PCR NEGATIVE NEGATIVE Final    Comment: (NOTE) The Xpert Xpress SARS-CoV-2/FLU/RSV plus assay is intended as an aid in the diagnosis of influenza from Nasopharyngeal swab specimens and should not be used as a sole basis for treatment. Nasal washings and aspirates are unacceptable for Xpert Xpress  SARS-CoV-2/FLU/RSV testing.  Fact Sheet for Patients: EntrepreneurPulse.com.au  Fact Sheet for Healthcare Providers: IncredibleEmployment.be  This test is not yet approved or cleared by the [place] FDA and has been authorized for detection and/or diagnosis of SARS-CoV-2 by FDA under an Emergency Use Authorization (EUA). This EUA will remain in effect (meaning this test can be used) for the duration of the COVID-19 declaration under Section 564(b)(1) of the Act, 21 U.S.C. section 360bbb-3(b)(1), unless the authorization is terminated or revoked.  Performed at [facility], [address] [place], [place] [address]   Blood Culture (routine x 2)     Status: None (Preliminary result)   Collection Time: 11/13/22  3:00 AM   Specimen: BLOOD RIGHT ARM  Result Value Ref Range Status   Specimen Description BLOOD RIGHT ARM  Final   Special Requests Blood Culture adequate volume  Final   Culture   Final    NO GROWTH 4 DAYS Performed at [facility], [address], [place], [place] [address]    Report Status PENDING  Incomplete  Blood Culture (routine x 2)     Status: None (Preliminary result)   Collection Time: 11/13/22  3:00 AM   Specimen: BLOOD LEFT ARM  Result Value Ref Range Status   Specimen Description BLOOD LEFT ARM  Final   Special Requests Blood Culture adequate volume  Final   Culture   Final    NO GROWTH 4  DAYS Performed at [facility], [address], [place], [place] [address]    Report Status PENDING  Incomplete  MRSA Next Gen by PCR, Nasal     Status: None   Collection Time: 11/13/22  5:54 PM   Specimen: Nasal Mucosa; Nasal Swab  Result Value Ref Range Status   MRSA by PCR Next Gen NOT DETECTED NOT DETECTED Final    Comment: (NOTE) The GeneXpert MRSA Assay (FDA approved for NASAL specimens only), is one component of a comprehensive MRSA colonization surveillance program. It is not intended to diagnose MRSA infection nor to guide or monitor treatment for MRSA infections. Test performance is not FDA approved in patients less than 32 years old. Performed at [facility], [address] [place], [place] [address]      Labs: BNP (last 3 results) Recent Labs    11/13/22 0133  BNP [id number]   Basic Metabolic Panel: Recent Labs  Lab 11/13/22 0133 11/15/22 0338 11/17/22 0417  NA 139 136 136  K 3.3* 3.2* 3.9  CL 101 94* 99  CO2 26 34* 29  GLUCOSE 158* 121* 112*  BUN 23* 29* 22*  CREATININE 1.30* 1.25* 1.14*  CALCIUM 9.2 8.8* 8.6*  MG  --  1.8  --    Liver Function Tests: No results for input(s): "AST", "ALT", "ALKPHOS", "BILITOT", "PROT", "ALBUMIN" in the last 168 hours. No results for input(s): "LIPASE", "AMYLASE" in the last 168 hours. No results for input(s): "AMMONIA" in the last 168 hours. CBC: Recent Labs  Lab 11/13/22 0133 11/15/22 0338  WBC 7.5 6.5  HGB 14.0 12.5  HCT 43.3 38.5  MCV 85.1 83.7  PLT 258 215   Cardiac Enzymes: No results for input(s): "CKTOTAL", "CKMB", "CKMBINDEX", "TROPONINI" in the last 168 hours. BNP: Invalid input(s): "POCBNP" CBG: Recent Labs  Lab 11/13/22 1754  GLUCAP 96   D-Dimer No results for input(s): "DDIMER" in the last 72 hours. Hgb A1c No results for input(s): "HGBA1C" in the last 72 hours. Lipid Profile No results for input(s): "CHOL", "  HDL", "LDLCALC", "TRIG", "CHOLHDL", "LDLDIRECT" in the last  72 hours. Thyroid function studies No results for input(s): "TSH", "T4TOTAL", "T3FREE", "THYROIDAB" in the last 72 hours.  Invalid input(s): "FREET3" Anemia work up No results for input(s): "VITAMINB12", "FOLATE", "FERRITIN", "TIBC", "IRON", "RETICCTPCT" in the last 72 hours. Urinalysis    Component Value Date/Time   COLORURINE STRAW (A) 10/23/2020 2113   APPEARANCEUR CLEAR (A) 10/23/2020 2113   APPEARANCEUR Hazy 02/23/2012 1511   LABSPEC 1.012 10/23/2020 2113   LABSPEC 1.032 02/23/2012 1511   PHURINE 5.0 10/23/2020 2113   GLUCOSEU NEGATIVE 10/23/2020 2113   GLUCOSEU Negative 02/23/2012 1511   HGBUR NEGATIVE 10/23/2020 2113   BILIRUBINUR NEGATIVE 10/23/2020 2113   BILIRUBINUR Negative 02/23/2012 1511   KETONESUR NEGATIVE 10/23/2020 2113   PROTEINUR NEGATIVE 10/23/2020 2113   NITRITE NEGATIVE 10/23/2020 2113   LEUKOCYTESUR NEGATIVE 10/23/2020 2113   LEUKOCYTESUR Negative 02/23/2012 1511   Sepsis Labs Recent Labs  Lab 11/13/22 0133 11/15/22 0338  WBC 7.5 6.5   Microbiology Recent Results (from the past 240 hour(s))  Resp Panel by RT-PCR (Flu A&B, Covid) Anterior Nasal Swab     Status: Abnormal   Collection Time: 11/13/22  1:23 AM   Specimen: Anterior Nasal Swab  Result Value Ref Range Status   SARS Coronavirus 2 by RT PCR NEGATIVE NEGATIVE Final    Comment: (NOTE) SARS-CoV-2 target nucleic acids are NOT DETECTED.  The SARS-CoV-2 RNA is generally detectable in upper respiratory specimens during the acute phase of infection. The lowest concentration of SARS-CoV-2 viral copies this assay can detect is 138 copies/mL. A negative result does not preclude SARS-Cov-2 infection and should not be used as the sole basis for treatment or other patient management decisions. A negative result may occur with  improper specimen collection/handling, submission of specimen other than nasopharyngeal swab, presence of viral mutation(s) within the areas targeted by this assay, and  inadequate number of viral copies(<138 copies/mL). A negative result must be combined with clinical observations, patient history, and epidemiological information. The expected result is Negative.  Fact Sheet for Patients:  EntrepreneurPulse.com.au  Fact Sheet for Healthcare Providers:  IncredibleEmployment.be  This test is no t yet approved or cleared by the [place] FDA and  has been authorized for detection and/or diagnosis of SARS-CoV-2 by FDA under an Emergency Use Authorization (EUA). This EUA will remain  in effect (meaning this test can be used) for the duration of the COVID-19 declaration under Section 564(b)(1) of the Act, 21 U.S.C.section 360bbb-3(b)(1), unless the authorization is terminated  or revoked sooner.       Influenza A by PCR POSITIVE (A) NEGATIVE Final   Influenza B by PCR NEGATIVE NEGATIVE Final    Comment: (NOTE) The Xpert Xpress SARS-CoV-2/FLU/RSV plus assay is intended as an aid in the diagnosis of influenza from Nasopharyngeal swab specimens and should not be used as a sole basis for treatment. Nasal washings and aspirates are unacceptable for Xpert Xpress SARS-CoV-2/FLU/RSV testing.  Fact Sheet for Patients: EntrepreneurPulse.com.au  Fact Sheet for Healthcare Providers: IncredibleEmployment.be  This test is not yet approved or cleared by the [place] FDA and has been authorized for detection and/or diagnosis of SARS-CoV-2 by FDA under an Emergency Use Authorization (EUA). This EUA will remain in effect (meaning this test can be used) for the duration of the COVID-19 declaration under Section 564(b)(1) of the Act, 21 U.S.C. section 360bbb-3(b)(1), unless the authorization is terminated or revoked.  Performed at [facility], [place], [place]  [address]   Blood Culture (routine x 2)     Status: None (Preliminary result)   Collection Time:  11/13/22  3:00 AM   Specimen: BLOOD RIGHT ARM  Result Value Ref Range Status   Specimen Description BLOOD RIGHT ARM  Final   Special Requests Blood Culture adequate volume  Final   Culture   Final    NO GROWTH 4 DAYS Performed at [facility], [address], [place], [place] [address]    Report Status PENDING  Incomplete  Blood Culture (routine x 2)     Status: None (Preliminary result)   Collection Time: 11/13/22  3:00 AM   Specimen: BLOOD LEFT ARM  Result Value Ref Range Status   Specimen Description BLOOD LEFT ARM  Final   Special Requests Blood Culture adequate volume  Final   Culture   Final    NO GROWTH 4 DAYS Performed at [facility], [address], [place], [place] [address]    Report Status PENDING  Incomplete  MRSA Next Gen by PCR, Nasal     Status: None   Collection Time: 11/13/22  5:54 PM   Specimen: Nasal Mucosa; Nasal Swab  Result Value Ref Range Status   MRSA by PCR Next Gen NOT DETECTED NOT DETECTED Final    Comment: (NOTE) The GeneXpert MRSA Assay (FDA approved for NASAL specimens only), is one component of a comprehensive MRSA colonization surveillance program. It is not intended to diagnose MRSA infection nor to guide or monitor treatment for MRSA infections. Test performance is not FDA approved in patients less than 72 years old. Performed at [facility], [address] [place], [place] [address]      Time coordinating discharge: 35 minutes  SIGNED:   Barb Merino, MD  [facility] 11/17/2022, 11:17 AM

## 2022-11-18 LAB — CULTURE, BLOOD (ROUTINE X 2)
Culture: NO GROWTH
Culture: NO GROWTH
Special Requests: ADEQUATE
Special Requests: ADEQUATE

## 2023-08-15 ENCOUNTER — Other Ambulatory Visit: Payer: Self-pay | Admitting: Obstetrics and Gynecology

## 2023-08-15 DIAGNOSIS — Z1231 Encounter for screening mammogram for malignant neoplasm of breast: Secondary | ICD-10-CM

## 2023-10-11 ENCOUNTER — Ambulatory Visit
Admission: RE | Admit: 2023-10-11 | Discharge: 2023-10-11 | Disposition: A | Discharge status: Home / Self Care | Payer: Medicare Other | Source: Ambulatory Visit | Attending: Obstetrics and Gynecology | Admitting: Obstetrics and Gynecology

## 2023-10-11 DIAGNOSIS — Z1231 Encounter for screening mammogram for malignant neoplasm of breast: Secondary | ICD-10-CM | POA: Insufficient documentation

## 2024-03-26 ENCOUNTER — Observation Stay (HOSPITAL_COMMUNITY)
Admission: EM | Admit: 2024-03-26 | Discharge: 2024-03-28 | Disposition: A | Discharge status: Home / Self Care | Attending: Internal Medicine | Admitting: Internal Medicine

## 2024-03-26 DIAGNOSIS — Z79899 Other long term (current) drug therapy: Secondary | ICD-10-CM | POA: Insufficient documentation

## 2024-03-26 DIAGNOSIS — G4700 Insomnia, unspecified: Secondary | ICD-10-CM | POA: Diagnosis not present

## 2024-03-26 DIAGNOSIS — N1832 Chronic kidney disease, stage 3b: Secondary | ICD-10-CM | POA: Diagnosis not present

## 2024-03-26 DIAGNOSIS — R1310 Dysphagia, unspecified: Secondary | ICD-10-CM | POA: Diagnosis present

## 2024-03-26 DIAGNOSIS — I129 Hypertensive chronic kidney disease with stage 1 through stage 4 chronic kidney disease, or unspecified chronic kidney disease: Secondary | ICD-10-CM | POA: Insufficient documentation

## 2024-03-26 DIAGNOSIS — F419 Anxiety disorder, unspecified: Secondary | ICD-10-CM | POA: Diagnosis not present

## 2024-03-26 DIAGNOSIS — E876 Hypokalemia: Secondary | ICD-10-CM | POA: Insufficient documentation

## 2024-03-26 DIAGNOSIS — M797 Fibromyalgia: Secondary | ICD-10-CM | POA: Diagnosis not present

## 2024-03-26 DIAGNOSIS — I639 Cerebral infarction, unspecified: Secondary | ICD-10-CM | POA: Diagnosis not present

## 2024-03-26 LAB — I-STAT CHEM 8, ED
BUN: 20 mg/dL (ref 6–20)
Calcium, Ion: 1.19 mmol/L (ref 1.15–1.40)
Chloride: 102 mmol/L (ref 98–111)
Creatinine, Ser: 1.7 mg/dL — ABNORMAL HIGH (ref 0.44–1.00)
Glucose, Bld: 107 mg/dL — ABNORMAL HIGH (ref 70–99)
HCT: 44 % (ref 36.0–46.0)
Hemoglobin: 15 g/dL (ref 12.0–15.0)
Potassium: 3.5 mmol/L (ref 3.5–5.1)
Sodium: 140 mmol/L (ref 135–145)
TCO2: 26 mmol/L (ref 22–32)

## 2024-03-26 LAB — DIFFERENTIAL
Abs Immature Granulocytes: 0.02 10*3/uL (ref 0.00–0.07)
Basophils Absolute: 0.1 10*3/uL (ref 0.0–0.1)
Basophils Relative: 1 %
Eosinophils Absolute: 0.4 10*3/uL (ref 0.0–0.5)
Eosinophils Relative: 4 %
Immature Granulocytes: 0 %
Lymphocytes Relative: 15 %
Lymphs Abs: 1.4 10*3/uL (ref 0.7–4.0)
Monocytes Absolute: 0.7 10*3/uL (ref 0.1–1.0)
Monocytes Relative: 7 %
Neutro Abs: 6.7 10*3/uL (ref 1.7–7.7)
Neutrophils Relative %: 73 %

## 2024-03-26 LAB — COMPREHENSIVE METABOLIC PANEL WITH GFR
ALT: 15 U/L (ref 0–44)
AST: 22 U/L (ref 15–41)
Albumin: 4 g/dL (ref 3.5–5.0)
Alkaline Phosphatase: 68 U/L (ref 38–126)
Anion gap: 12 (ref 5–15)
BUN: 18 mg/dL (ref 6–20)
CO2: 27 mmol/L (ref 22–32)
Calcium: 9.8 mg/dL (ref 8.9–10.3)
Chloride: 100 mmol/L (ref 98–111)
Creatinine, Ser: 1.54 mg/dL — ABNORMAL HIGH (ref 0.44–1.00)
GFR, Estimated: 39 mL/min — ABNORMAL LOW (ref >60)
Glucose, Bld: 111 mg/dL — ABNORMAL HIGH (ref 70–99)
Potassium: 3.6 mmol/L (ref 3.5–5.1)
Sodium: 139 mmol/L (ref 135–145)
Total Bilirubin: 0.9 mg/dL (ref 0.0–1.2)
Total Protein: 8 g/dL (ref 6.5–8.1)

## 2024-03-26 LAB — CBC
HCT: 41.2 % (ref 36.0–46.0)
Hemoglobin: 13.7 g/dL (ref 12.0–15.0)
MCH: 29.1 pg (ref 26.0–34.0)
MCHC: 33.3 g/dL (ref 30.0–36.0)
MCV: 87.5 fL (ref 80.0–100.0)
Platelets: 325 10*3/uL (ref 150–400)
RBC: 4.71 MIL/uL (ref 3.87–5.11)
RDW: 13.7 % (ref 11.5–15.5)
WBC: 9.3 10*3/uL (ref 4.0–10.5)
nRBC: 0 % (ref 0.0–0.2)

## 2024-03-26 LAB — PROTIME-INR
INR: 1.1 (ref 0.8–1.2)
Prothrombin Time: 13.9 s (ref 11.4–15.2)

## 2024-03-26 LAB — APTT: aPTT: 30 s (ref 24–36)

## 2024-03-26 LAB — ETHANOL: Alcohol, Ethyl (B): <15 mg/dL (ref <15)

## 2024-03-26 NOTE — ED Provider Triage Note (Signed)
 Emergency Medicine Provider Triage Evaluation Note  Victoria Guzman , a 58 y.o. female  was evaluated in triage.  Pt complains of difficulty expressing herself and slurred speech that started 03/25/2024 at approximately noon.  She was evaluated at a hospital in [place] where they diagnosed her with ischemic stroke, but she was unable to afford treatment, and so left.  Review of Systems  Positive: As above Negative: As above  Physical Exam  BP (!) 173/107 (BP Location: Right Arm)   Pulse (!) 101   Temp 98.5 F (36.9 C)   Resp 19   SpO2 99%  Gen:   Awake, no distress   Resp:  Normal effort   Other:  Aphasia on exam with the right sided arm weakness.  Symmetric smile.  Medical Decision Making  Medically screening exam initiated at 9:47 PM.  Appropriate orders placed.  Ismael L Dino was informed that the remainder of the evaluation will be completed by another provider, this initial triage assessment does not replace that evaluation, and the importance of remaining in the ED until their evaluation is complete.  Spoke to neurology Dr. Khaliqdna At 9:47 PM.  He recommended not activating a code stroke at this time.  Recommended obtaining CT head and CTA head as soon as possible along with stroke labs.  Labs and imaging have been ordered.  I notified CT that this patient will need to come back as soon as possible for her imaging.   Rexie Catena, PA-C 03/26/24 2149

## 2024-03-26 NOTE — ED Triage Notes (Signed)
 Pt bib daughter. Pt was in hospital in [place] yesterday and dx with a stroke due to the cost pt was unable to get treatment and flew home today and came straight here. Pt speech is slurred and having hard time forming sentences. Pt weak on right side. Facial droop on right side. LKW noon yesterday.

## 2024-03-27 ENCOUNTER — Emergency Department (HOSPITAL_COMMUNITY)

## 2024-03-27 ENCOUNTER — Other Ambulatory Visit: Payer: Self-pay

## 2024-03-27 ENCOUNTER — Observation Stay (HOSPITAL_BASED_OUTPATIENT_CLINIC_OR_DEPARTMENT_OTHER)

## 2024-03-27 DIAGNOSIS — I10 Essential (primary) hypertension: Secondary | ICD-10-CM

## 2024-03-27 DIAGNOSIS — I69391 Dysphagia following cerebral infarction: Secondary | ICD-10-CM

## 2024-03-27 DIAGNOSIS — E785 Hyperlipidemia, unspecified: Secondary | ICD-10-CM | POA: Diagnosis not present

## 2024-03-27 DIAGNOSIS — I639 Cerebral infarction, unspecified: Secondary | ICD-10-CM | POA: Diagnosis not present

## 2024-03-27 DIAGNOSIS — R1319 Other dysphagia: Secondary | ICD-10-CM

## 2024-03-27 DIAGNOSIS — I6389 Other cerebral infarction: Secondary | ICD-10-CM | POA: Diagnosis not present

## 2024-03-27 DIAGNOSIS — E119 Type 2 diabetes mellitus without complications: Secondary | ICD-10-CM | POA: Diagnosis not present

## 2024-03-27 LAB — ECHOCARDIOGRAM COMPLETE
AR max vel: 2.14 cm2
AV Area VTI: 2.13 cm2
AV Area mean vel: 2.14 cm2
AV Mean grad: 9 mmHg
AV Peak grad: 17.6 mmHg
Ao pk vel: 2.1 m/s
Area-P 1/2: 4.31 cm2
Calc EF: 63 %
S' Lateral: 2.1 cm
Single Plane A2C EF: 59.1 %
Single Plane A4C EF: 71.8 %

## 2024-03-27 MED ORDER — ASPIRIN 325 MG PO TABS
325.0000 mg | ORAL_TABLET | Freq: Every day | ORAL | Status: DC
  Filled 2024-03-27: qty 1

## 2024-03-27 MED ORDER — ZOLPIDEM TARTRATE 5 MG PO TABS
5.0000 mg | ORAL_TABLET | Freq: Every evening | ORAL | Status: DC | PRN
  Administered 2024-03-27: 5 mg via ORAL
  Filled 2024-03-27: qty 1

## 2024-03-27 MED ORDER — ACETAMINOPHEN 325 MG PO TABS: 650.0000 mg | ORAL_TABLET | Freq: Four times a day (QID) | ORAL | Status: DC | PRN

## 2024-03-27 MED ORDER — FAMOTIDINE 20 MG PO TABS: 20.0000 mg | ORAL_TABLET | Freq: Once | ORAL | Status: DC

## 2024-03-27 MED ORDER — SODIUM CHLORIDE 0.9% FLUSH
3.0000 mL | Freq: Two times a day (BID) | INTRAVENOUS | Status: DC
  Administered 2024-03-27 – 2024-03-28 (×3): 3 mL via INTRAVENOUS

## 2024-03-27 MED ORDER — ENOXAPARIN SODIUM 40 MG/0.4ML IJ SOSY
40.0000 mg | PREFILLED_SYRINGE | INTRAMUSCULAR | Status: DC
  Filled 2024-03-27 (×2): qty 0.4

## 2024-03-27 MED ORDER — ATORVASTATIN CALCIUM 40 MG PO TABS
40.0000 mg | ORAL_TABLET | Freq: Every day | ORAL | Status: DC
  Administered 2024-03-27: 40 mg via ORAL
  Filled 2024-03-27 (×2): qty 1

## 2024-03-27 MED ORDER — ACETAMINOPHEN 650 MG RE SUPP: 650.0000 mg | Freq: Four times a day (QID) | RECTAL | Status: DC | PRN

## 2024-03-27 MED ORDER — FAMOTIDINE IN NACL 20-0.9 MG/50ML-% IV SOLN
20.0000 mg | Freq: Once | INTRAVENOUS | Status: DC
  Filled 2024-03-27: qty 50

## 2024-03-27 MED ORDER — TIZANIDINE HCL 4 MG PO TABS
4.0000 mg | ORAL_TABLET | Freq: Every day | ORAL | Status: DC
  Administered 2024-03-27: 4 mg via ORAL
  Filled 2024-03-27: qty 1

## 2024-03-27 MED ORDER — IPRATROPIUM-ALBUTEROL 0.5-2.5 (3) MG/3ML IN SOLN: 3.0000 mL | Freq: Once | RESPIRATORY_TRACT | Status: DC

## 2024-03-27 MED ORDER — CLOPIDOGREL BISULFATE 75 MG PO TABS
75.0000 mg | ORAL_TABLET | Freq: Once | ORAL | Status: AC
  Administered 2024-03-27: 75 mg via ORAL
  Filled 2024-03-27: qty 1

## 2024-03-27 MED ORDER — IOHEXOL 350 MG/ML SOLN
75.0000 mL | Freq: Once | INTRAVENOUS | Status: AC | PRN
  Administered 2024-03-27: 75 mL via INTRAVENOUS

## 2024-03-27 MED ORDER — DIPHENHYDRAMINE HCL 50 MG/ML IJ SOLN
25.0000 mg | Freq: Once | INTRAMUSCULAR | Status: DC
  Filled 2024-03-27: qty 1

## 2024-03-27 MED ORDER — CLONAZEPAM 0.5 MG PO TABS
1.0000 mg | ORAL_TABLET | Freq: Every day | ORAL | Status: DC
  Administered 2024-03-27: 1 mg via ORAL
  Filled 2024-03-27: qty 2

## 2024-03-27 MED ORDER — METHYLPREDNISOLONE SODIUM SUCC 125 MG IJ SOLR
125.0000 mg | Freq: Once | INTRAMUSCULAR | Status: DC
  Filled 2024-03-27: qty 2

## 2024-03-27 MED ORDER — ALBUTEROL SULFATE (2.5 MG/3ML) 0.083% IN NEBU: 2.5000 mg | INHALATION_SOLUTION | RESPIRATORY_TRACT | Status: DC | PRN

## 2024-03-27 MED ORDER — CLOPIDOGREL BISULFATE 75 MG PO TABS
75.0000 mg | ORAL_TABLET | Freq: Every day | ORAL | Status: DC
  Administered 2024-03-28: 75 mg via ORAL
  Filled 2024-03-27: qty 1

## 2024-03-27 MED ORDER — EPINEPHRINE 0.3 MG/0.3ML IJ SOAJ
0.3000 mg | Freq: Once | INTRAMUSCULAR | Status: DC
  Filled 2024-03-27: qty 0.3

## 2024-03-27 MED ORDER — ASPIRIN 81 MG PO CHEW
81.0000 mg | CHEWABLE_TABLET | Freq: Every day | ORAL | Status: DC
  Administered 2024-03-28: 81 mg via ORAL
  Filled 2024-03-27 (×2): qty 1

## 2024-03-27 MED ORDER — ALBUTEROL SULFATE HFA 108 (90 BASE) MCG/ACT IN AERS: 2.0000 | INHALATION_SPRAY | RESPIRATORY_TRACT | Status: DC | PRN

## 2024-03-27 NOTE — H&P (Deleted)
 I interviewed and examined this patient independently, explaining to her our current diagnosis and plans for workup. I discussed her care with NP Carlon Chester, and agree with the content of this note.   Abbe Abate, MD [facility] Office  [phone]

## 2024-03-27 NOTE — ED Notes (Signed)
 Pt refusing bp's at 15 minute intervals and requests only to have them taken every 2 hours.

## 2024-03-27 NOTE — Consult Note (Signed)
 NEUROLOGY CONSULT NOTE   Date of service: March 27, 2024 Patient Name: Victoria Guzman MRN:  [id number] DOB:  [birth year] Chief Complaint: "aphasia and R sided weakness" Requesting Provider: Walton Guppy, MD  History of Present Illness  Victoria Guzman is a 58 y.o. female with hx of anxiety, hypertension, insomnia, depression, known history of left parietal infarct who presented to the ED with slurred speech, some expressive aphasia.  She reports that her symptoms started around noon on 03/25/2024 while she was in [place].  She was evaluated at a hospital in [place] and was diagnosed with a ischemic stroke but she was unable to afford treatment so she left and flew to [place]  and came to the hospital right away.  She had CT angio of the head and neck which demonstrated progression of the left MCA short segment M1 stenosis that was noted initially on the CTA of the head and neck from 2015.  She had an MRI of the brain which demonstrated multifocal acute ischemic infarct in the left MCA distribution and the right basal ganglia.  Neurology was consulted further evaluation and workup of the noted strokes.  Endorses prior hx of stroke in 2015 with no residual deficit. A few months ago, she had brief period of R sided numbness.  She does not smoke, does not use any recreational substances. She does not drink EtOH.  She was told to stop taking aspirin  a long time ago. She was told by a doctor to stop taking it despite her stroke.  LKW: 03/25/2024 around noon. Modified rankin score: 0-Completely asymptomatic and back to baseline post- stroke IV Thrombolysis: Not offered, she is outside the window.   EVT: Not offered, she is outside the window.    NIHSS components Score: Comment  1a Level of Conscious 0[x]  1[]  2[]  3[]      1b LOC Questions 0[x]  1[]  2[]       1c LOC Commands 0[x]  1[]  2[]       2 Best Gaze 0[x]  1[]  2[]       3 Visual 0[x]  1[]  2[]  3[]      4 Facial Palsy 0[x]  1[]  2[]  3[]      5a Motor Arm -  left 0[x]  1[]  2[]  3[]  4[]  UN[]    5b Motor Arm - Right 0[x]  1[]  2[]  3[]  4[]  UN[]    6a Motor Leg - Left 0[x]  1[]  2[]  3[]  4[]  UN[]    6b Motor Leg - Right 0[x]  1[]  2[]  3[]  4[]  UN[]    7 Limb Ataxia 0[]  1[]  2[]  3[]  UN[]     8 Sensory 0[]  1[]  2[x]  UN[]      9 Best Language 0[]  1[x]  2[]  3[]      10 Dysarthria 0[x]  1[]  2[]  UN[]      11 Extinct. and Inattention 0[x]  1[]  2[]       TOTAL: 3      ROS  Comprehensive ROS performed and pertinent positives documented in HPI   Past History   Past Medical History:  Diagnosis Date   Anemia    Anxiety    Chronic fatigue    Chronic insomnia 05/31/2016   Depression    Family history of adverse reaction to anesthesia    mother difficult to wake up after CABG   Fibromyalgia    Hypertension    Insomnia    Panic attack    Stroke [facility])    tia   Vitamin D  deficiency 05/31/2016    Past Surgical History:  Procedure Laterality Date   ABDOMINAL HYSTERECTOMY     COLONOSCOPY WITH  PROPOFOL  N/A 07/24/2018   Procedure: COLONOSCOPY WITH PROPOFOL ;  Surgeon: Toledo, Alphonsus Jeans, MD;  Location: [facility];  Service: Gastroenterology;  Laterality: N/A;   ESOPHAGOGASTRODUODENOSCOPY (EGD) WITH PROPOFOL  N/A 07/24/2018   Procedure: ESOPHAGOGASTRODUODENOSCOPY (EGD) WITH PROPOFOL ;  Surgeon: Toledo, Alphonsus Jeans, MD;  Location: [facility];  Service: Gastroenterology;  Laterality: N/A;   LAPAROSCOPIC BILATERAL SALPINGECTOMY Bilateral 01/25/2019   Procedure: LAPAROSCOPIC BILATERAL SALPINGECTOMY;  Surgeon: Schermerhorn, Joselyn Nicely, MD;  Location: [facility];  Service: Gynecology;  Laterality: Bilateral;   NASAL TURBINATE REDUCTION Bilateral 06/14/2022   Procedure: INFERIOR TURBINATE REDUCTION;  Surgeon: Mellody Sprout, MD;  Location: [facility];  Service: ENT;  Laterality: Bilateral;   PARATHYROIDECTOMY     PARTIAL HYSTERECTOMY     SEPTOPLASTY N/A 06/14/2022   Procedure: SEPTOPLASTY;  Surgeon: Mellody Sprout, MD;  Location: [facility];  Service: ENT;  Laterality:  N/A;   TRACHELECTOMY N/A 01/25/2019   Procedure: TRACHELECTOMY, laparoscopic cervical;  Surgeon: Carolynn Citrin, MD;  Location: [facility];  Service: Gynecology;  Laterality: N/A;    Family History: Family History  Problem Relation Age of Onset   Diabetes Mother    Heart disease Mother    Kidney disease Mother        dialysis x 7 years   Stroke Mother        tiny spot just found incidentally on scan   Cancer Father        multiple myeloma   Breast cancer Paternal Aunt 100    Social History  reports that she has never smoked. She has never used smokeless tobacco. She reports current alcohol use of about 1.0 standard drink of alcohol per week. She reports that she does not use drugs.  Allergies  Allergen Reactions   Iodinated Contrast Media Cough    Mild hypoxia, wheezing, throat edema feeling shortly after contrast 03/27/24. Improved without treatment, patient doesn't think it was an allergic reaction and refused all meds. Did improve without them.    Clonidine  Derivatives Hives   Quinolones Hives and Swelling   Trazodone Other (See Comments)    Caused sedation. Felt depressed while on medication.   Zofran  [Ondansetron  Hcl] Itching, Swelling and Other (See Comments)    swelling of lips    Medications   Current Facility-Administered Medications:    aspirin  tablet 325 mg, 325 mg, Oral, Daily, Mesner, Jason, MD   diphenhydrAMINE  (BENADRYL ) injection 25 mg, 25 mg, Intravenous, Once, Mesner, Reymundo Caulk, MD   EPINEPHrine  (EPI-PEN) injection 0.3 mg, 0.3 mg, Intramuscular, Once, Mesner, Reymundo Caulk, MD   famotidine  (PEPCID ) IVPB 20 mg premix, 20 mg, Intravenous, Once, Mesner, Reymundo Caulk, MD   ipratropium-albuterol  (DUONEB) 0.5-2.5 (3) MG/3ML nebulizer solution 3 mL, 3 mL, Nebulization, Once, Mesner, Reymundo Caulk, MD   methylPREDNISolone  sodium succinate (SOLU-MEDROL ) 125 mg/2 mL injection 125 mg, 125 mg, Intravenous, Once, Mesner, Reymundo Caulk, MD  Current Outpatient Medications:    acetaminophen  (TYLENOL )  650 MG CR tablet, Take 1,300 mg by mouth every 8 (eight) hours as needed for pain., Disp: , Rfl:    albuterol  (PROAIR  HFA) 108 (90 Base) MCG/ACT inhaler, Inhale 2 puffs into the lungs every 4 (four) hours as needed for wheezing or shortness of breath., Disp: 1 each, Rfl: 2   clonazePAM  (KLONOPIN ) 1 MG tablet, Take 1 mg by mouth at bedtime., Disp: , Rfl:    lisinopril -hydrochlorothiazide  (PRINZIDE ,ZESTORETIC ) 20-12.5 MG tablet, Take 1 tablet by mouth 2 (two) times daily., Disp: , Rfl: 1   oxyCODONE -acetaminophen  (PERCOCET) 10-325 MG tablet, Take 1 tablet by  mouth every 8 (eight) hours as needed for pain., Disp: , Rfl:    promethazine  (PHENERGAN ) 25 MG tablet, Take 25 mg by mouth every 8 (eight) hours as needed for nausea or vomiting., Disp: , Rfl:    tiZANidine  (ZANAFLEX ) 4 MG tablet, Take 4 mg by mouth at bedtime., Disp: , Rfl:    zolpidem  (AMBIEN ) 10 MG tablet, Take 10 mg by mouth at bedtime., Disp: , Rfl: 1  Vitals   Vitals:   04/15/24 2136 03/27/24 0151  BP: (!) 173/107 (!) 177/119  Pulse: (!) 101 97  Resp: 19 15  Temp: 98.5 F (36.9 C)   SpO2: 99% 100%    There is no height or weight on file to calculate BMI.  Physical Exam   General: Laying comfortably in bed; in no acute distress.  HENT: Normal oropharynx and mucosa. Normal external appearance of ears and nose.  Neck: Supple, no pain or tenderness  CV: No JVD. No peripheral edema.  Pulmonary: Symmetric Chest rise. Normal respiratory effort.  Abdomen: Soft to touch, non-tender.  Ext: No cyanosis, edema, or deformity  Skin: No rash. Normal palpation of skin.   Musculoskeletal: Normal digits and nails by inspection. No clubbing.   Neurologic Examination  Mental status/Cognition: Alert, oriented to self, place, month and year, good attention.  Speech/language: Fluency is maintained for the most part, comprehension intact to most simple commands, names all objects. Repetition is intact Cranial nerves:   CN II Pupils equal and  reactive to light, no VF deficits    CN III,IV,VI EOM intact, no gaze preference or deviation, no nystagmus    CN V normal sensation in V1, V2, and V3 segments bilaterally    CN VII no asymmetry, no nasolabial fold flattening    CN VIII normal hearing to speech    CN IX & X normal palatal elevation, no uvular deviation    CN XI 5/5 head turn and 5/5 shoulder shrug bilaterally    CN XII midline tongue protrusion    Motor:  Muscle bulk: normal, tone normal, pronator drift noted in RUE. Mvmt Root Nerve  Muscle Right Left Comments  SA C5/6 Ax Deltoid 5 5   EF C5/6 Mc Biceps 5 5   EE C6/7/8 Rad Triceps 5 5   WF C6/7 Med FCR     WE C7/8 PIN ECU     F Ab C8/T1 U ADM/FDI 2 5   HF L1/2/3 Fem Illopsoas 4+ 5   KE L2/3/4 Fem Quad 5 5   DF L4/5 D Peron Tib Ant 5 5   PF S1/2 Tibial Grc/Sol 5 5    Sensation:  Light touch Intact throughout   Pin prick    Temperature    Vibration   Proprioception    Coordination/Complex Motor:  - Finger to Nose intact BL - Heel to shin intact BL - Rapid alternating movement are slowed on the right - Gait: deferred.   Labs/Imaging/Neurodiagnostic studies   CBC:  Recent Labs  Lab Apr 15, 2024 2146 04-15-24 2155  WBC 9.3  --   NEUTROABS 6.7  --   HGB 13.7 15.0  HCT 41.2 44.0  MCV 87.5  --   PLT 325  --    Basic Metabolic Panel:  Lab Results  Component Value Date   NA 140 15-Apr-2024   K 3.5 2024/04/15   CO2 27 15-Apr-2024   GLUCOSE 107 (H) Apr 15, 2024   BUN 20 04-15-2024   CREATININE 1.70 (H) 04-15-24   CALCIUM  9.8 04/15/24  GFRNONAA 39 (L) 03/26/2024   GFRAA 56 (L) 01/26/2019   Lipid Panel:  Lab Results  Component Value Date   LDLCALC 65 10/12/2015   HgbA1c:  Lab Results  Component Value Date   HGBA1C 5.6 05/31/2016   Urine Drug Screen: No results found for: "LABOPIA", "COCAINSCRNUR", "LABBENZ", "AMPHETMU", "THCU", "LABBARB"  Alcohol Level     Component Value Date/Time   [facility] <15 03/26/2024 2146   INR  Lab Results   Component Value Date   INR 1.1 03/26/2024   APTT  Lab Results  Component Value Date   APTT 30 03/26/2024   AED levels: No results found for: "PHENYTOIN", "ZONISAMIDE", "LAMOTRIGINE", "LEVETIRACETA"  CT Head without contrast(Personally reviewed): CTH was negative for a large hypodensity concerning for a large territory infarct or hyperdensity concerning for an ICH  CT angio Head and Neck with contrast(Personally reviewed): 1. 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 Good collateral flow distally. 2. 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   MRI Brain(Personally reviewed): Multifocal acute ischemia within the anterior left MCA territory and at the right basal ganglia. No hemorrhage or mass effect.  ASSESSMENT   KALLE BERNATH is a 58 y.o. female with hx of anxiety, hypertension, insomnia, depression, known history of left parietal infarct who presented to the ED with slurred speech, some expressive aphasia. She was found to have a multifocal L MCA stroke with progression of the known left MCA M1 stenosis.  Etiology is unclear, progression of large artery atherosclerosis vs embolic.  RECOMMENDATIONS  Plan:  Recommend that primary team order following: - Frequent Neuro checks per stroke unit protocol - Recommend obtaining TTE  - Recommend obtaining Lipid panel with LDL - Please start statin if LDL > 70 - Recommend HbA1c to evaluate for diabetes and how well it is controlled. - Antithrombotic - Aspirin  81mg  daily along with plavix  75mg  daily x 90 days, followed by Aspirin  81mg  daily alone. - Recommend DVT ppx - SBP goal - permissive hypertension first 24 h < 220/110. Held home meds.  - Recommend Telemetry monitoring for arrythmia - Recommend bedside swallow screen prior to PO intake. - Stroke education booklet - Recommend PT/OT/SLP  consult  ______________________________________________________________________    Signed, Martavious Hartel, MD [facility]

## 2024-03-27 NOTE — ED Notes (Signed)
 Pt up to bathroom, able to ambulate unassisted to bathroom in room, and afterwards sat in chair in room. Updated on status.

## 2024-03-27 NOTE — ED Provider Notes (Signed)
 [place] [facility] AT [facility] Provider Note   CSN: [id number] Arrival date & time: 03/26/24  2120     History  Chief Complaint  Patient presents with   Dysphagia    Victoria Guzman is a 58 y.o. female.  Difficult historian but patient was in [place]. States that she got hot so went to a hospital and they told her she had ischemic stroke and needed a surgery. It cost too much so she got on a plane and came here. Her hot symptoms started over 30 hours prior to arriving in the ED here. Still has difficulty speaking, right arm weakness.         Home Medications Prior to Admission medications   Medication Sig Start Date End Date Taking? Authorizing Provider  acetaminophen  (TYLENOL ) 650 MG CR tablet Take 1,300 mg by mouth every 8 (eight) hours as needed for pain.    [provider]  albuterol  (PROAIR  HFA) 108 (90 Base) MCG/ACT inhaler Inhale 2 puffs into the lungs every 4 (four) hours as needed for wheezing or shortness of breath. 11/17/22   Vada Garibaldi, MD  lisinopril -hydrochlorothiazide  (PRINZIDE ,ZESTORETIC ) 20-12.5 MG tablet Take 1 tablet by mouth 2 (two) times daily. 08/14/18   [provider]  oxyCODONE -acetaminophen  (PERCOCET/ROXICET) 5-325 MG tablet Take 1 tablet by mouth every 6 (six) hours as needed. 04/24/18   [provider]  tiZANidine  (ZANAFLEX ) 4 MG capsule Take 1 capsule (4 mg total) by mouth 3 (three) times daily. Patient not taking: Reported on 11/13/2022 12/29/16   Sharren Decree, MD  valACYclovir (VALTREX) 1000 MG tablet Take 1,000 mg by mouth See admin instructions. Take 1000mg  by mouth twice a day for 7 days as needed for outbreaks 08/11/18   [provider]  zolpidem  (AMBIEN ) 10 MG tablet Take 10 mg by mouth at bedtime. 08/07/18   [provider]      Allergies    Iodinated contrast media, Clonidine  derivatives, Quinolones, Trazodone, and Zofran  [ondansetron  hcl]    Review of Systems   Review  of Systems  Physical Exam Updated Vital Signs BP (!) 177/119   Pulse 97   Temp 98.5 F (36.9 C)   Resp 15   SpO2 100%  Physical Exam Vitals and nursing note reviewed.  Constitutional:      Appearance: She is well-developed.  HENT:     Head: Normocephalic and atraumatic.  Eyes:     Pupils: Pupils are equal, round, and reactive to light.  Cardiovascular:     Rate and Rhythm: Normal rate and regular rhythm.  Pulmonary:     Effort: No respiratory distress.     Breath sounds: No stridor.  Abdominal:     General: There is no distension.  Musculoskeletal:     Cervical back: Normal range of motion.  Neurological:     Mental Status: She is alert.     Comments: Dysarthria Mild aphasia Right arm weakness Right facial droop     ED Results / Procedures / Treatments   Labs (all labs ordered are listed, but only abnormal results are displayed) Labs Reviewed  COMPREHENSIVE METABOLIC PANEL WITH GFR - Abnormal; Notable for the following components:      Result Value   Glucose, Bld 111 (*)    Creatinine, Ser 1.54 (*)    GFR, Estimated 39 (*)    All other components within normal limits  I-STAT CHEM 8, ED - Abnormal; Notable for the following components:   Creatinine, Ser 1.70 (*)  Glucose, Bld 107 (*)    All other components within normal limits  PROTIME-INR  APTT  CBC  DIFFERENTIAL  ETHANOL  CBG MONITORING, ED  CBG MONITORING, ED    EKG None  Radiology MR BRAIN WO CONTRAST Result Date: 03/27/2024 CLINICAL DATA:  Acute neurologic deficit EXAM: MRI HEAD WITHOUT CONTRAST TECHNIQUE: Multiplanar, multiecho pulse sequences of the brain and surrounding structures were obtained without intravenous contrast. COMPARISON:  None Available. FINDINGS: Brain: There is multifocal acute ischemia within the anterior left MCA territory and at the right basal ganglia. No acute or chronic hemorrhage. There is multifocal hyperintense T2-weighted signal within the white matter. Parenchymal  volume and CSF spaces are normal. Anterior left temporal lobe encephalomalacia. The midline structures are normal. Vascular: Normal flow voids. Skull and upper cervical spine: Normal calvarium and skull base. Visualized upper cervical spine and soft tissues are normal. Sinuses/Orbits:No paranasal sinus fluid levels or advanced mucosal thickening. No mastoid or middle ear effusion. Normal orbits. IMPRESSION: Multifocal acute ischemia within the anterior left MCA territory and at the right basal ganglia. No hemorrhage or mass effect. Electronically Signed   By: Juanetta Nordmann M.D.   On: 03/27/2024 02:56   CT Head Wo Contrast Result Date: 03/27/2024 CLINICAL DATA:  Stroke suspected EXAM: CT HEAD WITHOUT CONTRAST CT ANGIOGRAPHY OF THE HEAD AND NECK TECHNIQUE: Contiguous axial images were obtained from the base of the skull through the vertex without intravenous contrast. Multidetector CT imaging of the head and neck was performed using the standard protocol during bolus administration of intravenous contrast. Multiplanar CT image reconstructions and MIPs were obtained to evaluate the vascular anatomy. Carotid stenosis measurements (when applicable) are obtained utilizing NASCET criteria, using the distal internal carotid diameter as the denominator. RADIATION DOSE REDUCTION: This exam was performed according to the departmental dose-optimization program which includes automated exposure control, adjustment of the mA and/or kV according to patient size and/or use of iterative reconstruction technique. 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 No acute hemorrhage. No mass lesion. No hydrocephalus. Vascular: No hyperdense vessel or unexpected vascular calcification. Skull: The visualized skull base, calvarium and extracranial soft tissues are normal. Sinuses/Orbits: No fluid levels or advanced mucosal thickening of  the visualized paranasal sinuses. No mastoid or middle ear effusion. Normal orbits. CTA NECK FINDINGS Skeleton: No acute abnormality or high grade bony spinal canal stenosis. Other neck: Normal pharynx, larynx and major salivary glands. No cervical lymphadenopathy. Unremarkable thyroid gland. Upper chest: No pneumothorax or pleural effusion. No nodules or masses. Aortic arch: There is no calcific atherosclerosis of the aortic arch. Normal variant aortic arch branching pattern with the brachiocephalic and left common carotid arteries sharing a common origin. RIGHT carotid system: Normal without aneurysm, dissection or stenosis. LEFT carotid system: Normal without aneurysm, dissection or stenosis. Vertebral arteries: Codominant configuration. There is no dissection, occlusion or flow-limiting stenosis to the skull base (V1-V3 segments). CTA HEAD FINDINGS POSTERIOR CIRCULATION: Vertebral arteries are normal. No proximal occlusion of the anterior or inferior cerebellar arteries. Basilar artery is normal. Superior cerebellar arteries are normal. Posterior cerebral arteries are normal. ANTERIOR CIRCULATION: Intracranial internal carotid arteries are normal. Anterior cerebral arteries are normal. Progression of high-grade stenosis and short segment occlusion of the distal M1 segment of the left MCA. Good collateral flow distally. The right MCA is normal. The occluded portion of the left MCA measures approximately 8 mm in length. Venous sinuses: As permitted  by contrast timing, patent. Anatomic variants: None Review of the MIP images confirms the above findings. IMPRESSION: 1. 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 Good collateral flow distally. 2. 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 Electronically Signed   By: Juanetta Nordmann M.D.   On: 03/27/2024 01:52   CT ANGIO HEAD NECK W WO CM Result Date: 03/27/2024 CLINICAL DATA:  Stroke suspected EXAM: CT  HEAD WITHOUT CONTRAST CT ANGIOGRAPHY OF THE HEAD AND NECK TECHNIQUE: Contiguous axial images were obtained from the base of the skull through the vertex without intravenous contrast. Multidetector CT imaging of the head and neck was performed using the standard protocol during bolus administration of intravenous contrast. Multiplanar CT image reconstructions and MIPs were obtained to evaluate the vascular anatomy. Carotid stenosis measurements (when applicable) are obtained utilizing NASCET criteria, using the distal internal carotid diameter as the denominator. RADIATION DOSE REDUCTION: This exam was performed according to the departmental dose-optimization program which includes automated exposure control, adjustment of the mA and/or kV according to patient size and/or use of iterative reconstruction technique. 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 No acute hemorrhage. No mass lesion. No hydrocephalus. Vascular: No hyperdense vessel or unexpected vascular calcification. Skull: The visualized skull base, calvarium and extracranial soft tissues are normal. Sinuses/Orbits: No fluid levels or advanced mucosal thickening of the visualized paranasal sinuses. No mastoid or middle ear effusion. Normal orbits. CTA NECK FINDINGS Skeleton: No acute abnormality or high grade bony spinal canal stenosis. Other neck: Normal pharynx, larynx and major salivary glands. No cervical lymphadenopathy. Unremarkable thyroid gland. Upper chest: No pneumothorax or pleural effusion. No nodules or masses. Aortic arch: There is no calcific atherosclerosis of the aortic arch. Normal variant aortic arch branching pattern with the brachiocephalic and left common carotid arteries sharing a common origin. RIGHT carotid system: Normal without aneurysm, dissection or stenosis. LEFT carotid system: Normal without aneurysm, dissection or  stenosis. Vertebral arteries: Codominant configuration. There is no dissection, occlusion or flow-limiting stenosis to the skull base (V1-V3 segments). CTA HEAD FINDINGS POSTERIOR CIRCULATION: Vertebral arteries are normal. No proximal occlusion of the anterior or inferior cerebellar arteries. Basilar artery is normal. Superior cerebellar arteries are normal. Posterior cerebral arteries are normal. ANTERIOR CIRCULATION: Intracranial internal carotid arteries are normal. Anterior cerebral arteries are normal. Progression of high-grade stenosis and short segment occlusion of the distal M1 segment of the left MCA. Good collateral flow distally. The right MCA is normal. The occluded portion of the left MCA measures approximately 8 mm in length. Venous sinuses: As permitted by contrast timing, patent. Anatomic variants: None Review of the MIP images confirms the above findings. IMPRESSION: 1. 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 Good collateral flow distally. 2. 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 Electronically Signed   By: Juanetta Nordmann M.D.   On: 03/27/2024 01:52    Procedures .Critical Care  Performed by: Eve Hinders, MD Authorized by: Eve Hinders, MD   Critical care provider statement:    Critical care time (minutes):  30   Critical care was necessary to treat or prevent imminent or life-threatening deterioration of the following conditions:  CNS failure or compromise   Critical care was time spent personally by me on the following activities:  Development of treatment plan with patient or  surrogate, discussions with consultants, evaluation of patient's response to treatment, examination of patient, ordering and review of laboratory studies, ordering and review of radiographic studies, ordering and performing treatments and interventions, pulse oximetry, re-evaluation of patient's condition and review of old charts      Medications Ordered in ED Medications  EPINEPHrine  (EPI-PEN) injection 0.3 mg (0.3 mg Intramuscular Patient Refused/Not Given 03/27/24 0146)  methylPREDNISolone  sodium succinate (SOLU-MEDROL ) 125 mg/2 mL injection 125 mg (125 mg Intravenous Patient Refused/Not Given 03/27/24 0146)  diphenhydrAMINE  (BENADRYL ) injection 25 mg (25 mg Intravenous Patient Refused/Not Given 03/27/24 0146)  famotidine  (PEPCID ) IVPB 20 mg premix (20 mg Intravenous Patient Refused/Not Given 03/27/24 0146)  ipratropium-albuterol  (DUONEB) 0.5-2.5 (3) MG/3ML nebulizer solution 3 mL (3 mLs Nebulization Patient Refused/Not Given 03/27/24 0225)  aspirin  tablet 325 mg (has no administration in time range)  iohexol  (OMNIPAQUE ) 350 MG/ML injection 75 mL (75 mLs Intravenous Contrast Given 03/27/24 0139)  clopidogrel  (PLAVIX ) tablet 75 mg (75 mg Oral Given 03/27/24 0329)    ED Course/ Medical Decision Making/ A&P                                 Medical Decision Making Amount and/or Complexity of Data Reviewed Radiology: ordered.  Risk OTC drugs. Prescription drug management. Decision regarding hospitalization.   Outside the window for TNK, does probably have some mild aphasia but outside the window for any mechanical interventions as well as it has been at least 30 hours since last known normal. D/w Neuro, recommends cta.  Cta shows R MCA occlusion/stenosis on my interpretation with neurology, rads read thinks M1 has progressed but good collateral flow, wants MRI.   MRI with acute ischemic infarcts. ASA/PLavix  ordered. Will allow for permissive hypertension acutely. Neuro will see. Admitted to medicine.    Final Clinical Impression(s) / ED Diagnoses Final diagnoses:  Acute CVA (cerebrovascular accident) [facility])    Rx / DC Orders ED Discharge Orders     None         Emika Tiano, Reymundo Caulk, MD 03/27/24 [phone]

## 2024-03-27 NOTE — ED Notes (Signed)
 Pt ambulating with steady gait (daughter at side) around nurses station

## 2024-03-27 NOTE — Progress Notes (Addendum)
 STROKE TEAM PROGRESS NOTE   INTERIM HISTORY/SUBJECTIVE Family at the bedside.  Patient is laying in the bed in no apparent distress She was on vacation in [place] and developed some trouble speaking and right arm weakness and so she flew back to the [place]  and came to the hospital MRI with acute left MCA and right basal ganglia infarct CT angiogram of the brain shows progression of high-grade left MCA stenosis and short segment occlusion with good collateral flow distally. OBJECTIVE  CBC    Component Value Date/Time   WBC 9.3 03/26/2024 2146   RBC 4.71 03/26/2024 2146   HGB 15.0 03/26/2024 2155   HGB 12.4 10/12/2015 1523   HCT 44.0 03/26/2024 2155   HCT 38.0 10/12/2015 1523   PLT 325 03/26/2024 2146   PLT 377 10/12/2015 1523   MCV 87.5 03/26/2024 2146   MCV 82 10/12/2015 1523   MCV 82 04/22/2014 1208   MCH 29.1 03/26/2024 2146   MCHC 33.3 03/26/2024 2146   RDW 13.7 03/26/2024 2146   RDW 15.2 10/12/2015 1523   RDW 15.4 (H) 04/22/2014 1208   LYMPHSABS 1.4 03/26/2024 2146   LYMPHSABS 1.1 04/22/2014 1208   MONOABS 0.7 03/26/2024 2146   MONOABS 0.6 04/22/2014 1208   EOSABS 0.4 03/26/2024 2146   EOSABS 0.7 04/22/2014 1208   BASOSABS 0.1 03/26/2024 2146   BASOSABS 0.1 04/22/2014 1208    BMET    Component Value Date/Time   NA 140 03/26/2024 2155   NA 135 (L) 10/12/2015 1523   NA 137 04/22/2014 1208   K 3.5 03/26/2024 2155   K 3.5 04/22/2014 1208   CL 102 03/26/2024 2155   CL 103 04/22/2014 1208   CO2 27 03/26/2024 2146   CO2 30 04/22/2014 1208   GLUCOSE 107 (H) 03/26/2024 2155   GLUCOSE 115 (H) 04/22/2014 1208   BUN 20 03/26/2024 2155   BUN 8 10/12/2015 1523   BUN 9 04/22/2014 1208   CREATININE 1.70 (H) 03/26/2024 2155   CREATININE 0.78 05/31/2016 1241   CALCIUM  9.8 03/26/2024 2146   CALCIUM  10.7 (H) 04/22/2014 1208   GFRNONAA 39 (L) 03/26/2024 2146   GFRNONAA >89 05/31/2016 1241    IMAGING past 24 hours MR BRAIN WO CONTRAST Result Date:  03/27/2024 CLINICAL DATA:  Acute neurologic deficit EXAM: MRI HEAD WITHOUT CONTRAST TECHNIQUE: Multiplanar, multiecho pulse sequences of the brain and surrounding structures were obtained without intravenous contrast. COMPARISON:  None Available. FINDINGS: Brain: There is multifocal acute ischemia within the anterior left MCA territory and at the right basal ganglia. No acute or chronic hemorrhage. There is multifocal hyperintense T2-weighted signal within the white matter. Parenchymal volume and CSF spaces are normal. Anterior left temporal lobe encephalomalacia. The midline structures are normal. Vascular: Normal flow voids. Skull and upper cervical spine: Normal calvarium and skull base. Visualized upper cervical spine and soft tissues are normal. Sinuses/Orbits:No paranasal sinus fluid levels or advanced mucosal thickening. No mastoid or middle ear effusion. Normal orbits. IMPRESSION: Multifocal acute ischemia within the anterior left MCA territory and at the right basal ganglia. No hemorrhage or mass effect. Electronically Signed   By: Juanetta Nordmann M.D.   On: 03/27/2024 02:56   CT Head Wo Contrast Result Date: 03/27/2024 CLINICAL DATA:  Stroke suspected EXAM: CT HEAD WITHOUT CONTRAST CT ANGIOGRAPHY OF THE HEAD AND NECK TECHNIQUE: Contiguous axial images were obtained from the base of the skull through the vertex without intravenous contrast. Multidetector CT imaging of the head and neck was performed using  the standard protocol during bolus administration of intravenous contrast. Multiplanar CT image reconstructions and MIPs were obtained to evaluate the vascular anatomy. Carotid stenosis measurements (when applicable) are obtained utilizing NASCET criteria, using the distal internal carotid diameter as the denominator. RADIATION DOSE REDUCTION: This exam was performed according to the departmental dose-optimization program which includes automated exposure control, adjustment of the mA and/or kV according  to patient size and/or use of iterative reconstruction technique. 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 No acute hemorrhage. No mass lesion. No hydrocephalus. Vascular: No hyperdense vessel or unexpected vascular calcification. Skull: The visualized skull base, calvarium and extracranial soft tissues are normal. Sinuses/Orbits: No fluid levels or advanced mucosal thickening of the visualized paranasal sinuses. No mastoid or middle ear effusion. Normal orbits. CTA NECK FINDINGS Skeleton: No acute abnormality or high grade bony spinal canal stenosis. Other neck: Normal pharynx, larynx and major salivary glands. No cervical lymphadenopathy. Unremarkable thyroid gland. Upper chest: No pneumothorax or pleural effusion. No nodules or masses. Aortic arch: There is no calcific atherosclerosis of the aortic arch. Normal variant aortic arch branching pattern with the brachiocephalic and left common carotid arteries sharing a common origin. RIGHT carotid system: Normal without aneurysm, dissection or stenosis. LEFT carotid system: Normal without aneurysm, dissection or stenosis. Vertebral arteries: Codominant configuration. There is no dissection, occlusion or flow-limiting stenosis to the skull base (V1-V3 segments). CTA HEAD FINDINGS POSTERIOR CIRCULATION: Vertebral arteries are normal. No proximal occlusion of the anterior or inferior cerebellar arteries. Basilar artery is normal. Superior cerebellar arteries are normal. Posterior cerebral arteries are normal. ANTERIOR CIRCULATION: Intracranial internal carotid arteries are normal. Anterior cerebral arteries are normal. Progression of high-grade stenosis and short segment occlusion of the distal M1 segment of the left MCA. Good collateral flow distally. The right MCA is normal. The occluded portion of the left MCA measures approximately 8 mm in length. Venous  sinuses: As permitted by contrast timing, patent. Anatomic variants: None Review of the MIP images confirms the above findings. IMPRESSION: 1. 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 Good collateral flow distally. 2. 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 Electronically Signed   By: Juanetta Nordmann M.D.   On: 03/27/2024 01:52   CT ANGIO HEAD NECK W WO CM Result Date: 03/27/2024 CLINICAL DATA:  Stroke suspected EXAM: CT HEAD WITHOUT CONTRAST CT ANGIOGRAPHY OF THE HEAD AND NECK TECHNIQUE: Contiguous axial images were obtained from the base of the skull through the vertex without intravenous contrast. Multidetector CT imaging of the head and neck was performed using the standard protocol during bolus administration of intravenous contrast. Multiplanar CT image reconstructions and MIPs were obtained to evaluate the vascular anatomy. Carotid stenosis measurements (when applicable) are obtained utilizing NASCET criteria, using the distal internal carotid diameter as the denominator. RADIATION DOSE REDUCTION: This exam was performed according to the departmental dose-optimization program which includes automated exposure control, adjustment of the mA and/or kV according to patient size and/or use of iterative reconstruction technique. 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 No acute hemorrhage. No mass lesion. No hydrocephalus. Vascular: No hyperdense vessel or unexpected vascular calcification. Skull: The visualized skull base, calvarium and extracranial soft tissues are normal. Sinuses/Orbits: No fluid levels or advanced mucosal thickening of the visualized paranasal  sinuses. No mastoid or middle ear effusion. Normal orbits. CTA NECK FINDINGS Skeleton: No acute abnormality or high grade bony spinal canal stenosis. Other neck: Normal pharynx,  larynx and major salivary glands. No cervical lymphadenopathy. Unremarkable thyroid gland. Upper chest: No pneumothorax or pleural effusion. No nodules or masses. Aortic arch: There is no calcific atherosclerosis of the aortic arch. Normal variant aortic arch branching pattern with the brachiocephalic and left common carotid arteries sharing a common origin. RIGHT carotid system: Normal without aneurysm, dissection or stenosis. LEFT carotid system: Normal without aneurysm, dissection or stenosis. Vertebral arteries: Codominant configuration. There is no dissection, occlusion or flow-limiting stenosis to the skull base (V1-V3 segments). CTA HEAD FINDINGS POSTERIOR CIRCULATION: Vertebral arteries are normal. No proximal occlusion of the anterior or inferior cerebellar arteries. Basilar artery is normal. Superior cerebellar arteries are normal. Posterior cerebral arteries are normal. ANTERIOR CIRCULATION: Intracranial internal carotid arteries are normal. Anterior cerebral arteries are normal. Progression of high-grade stenosis and short segment occlusion of the distal M1 segment of the left MCA. Good collateral flow distally. The right MCA is normal. The occluded portion of the left MCA measures approximately 8 mm in length. Venous sinuses: As permitted by contrast timing, patent. Anatomic variants: None Review of the MIP images confirms the above findings. IMPRESSION: 1. 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 Good collateral flow distally. 2. 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 Electronically Signed   By: Juanetta Nordmann M.D.   On: 03/27/2024 01:52    Vitals:   03/27/24 0500 03/27/24 0630 03/27/24 0648 03/27/24 0730  BP: (!) 142/87 (!) 158/120 (!) 167/103 (!) 164/69  Pulse: 84 81 87 80  Resp: 18 (!) 21 19 (!) 21  Temp:   98.3 F (36.8 C)   TempSrc:   Oral   SpO2: 100% 95% 97% 100%     PHYSICAL EXAM General:  Alert, well-nourished,  well-developed patient in no acute distress Psych:  Mood and affect appropriate for situation CV: Regular rate and rhythm on monitor Respiratory:  Regular, unlabored respirations on room air GI: Abdomen soft and nontender   NEURO:  Mental Status: AA&Ox3, has some expressive aphasia.  Able to name objects and hold a conversation, can repeat  Cranial Nerves:  II: PERRL. Visual fields full.  III, IV, VI: EOMI. Eyelids elevate symmetrically.  V: Sensation is intact to light touch and symmetrical to face.  VII: Right facial droop VIII: hearing intact to voice. IX, X: Palate elevates symmetrically. Phonation is normal.  NW:GNFAOZHY shrug 5/5. XII: tongue is midline without fasciculations. Motor: 5/5 strength to all muscle groups tested.  Has weak grip on right Tone: is normal and bulk is normal Sensation- Intact to light touch bilaterally. Extinction absent to light touch to DSS.   Coordination: FTN intact bilaterally, HKS: no ataxia in BLE.No drift.  Decreased fine motor skills on right hand Gait- deferred  Most Recent NIH  1a Level of Conscious.: 0 1b LOC Questions: 0 1c LOC Commands: 0 2 Best Gaze: 0 3 Visual: 0 4 Facial Palsy: 1 5a Motor Arm - left: 0 5b Motor Arm - Right: 0 6a Motor Leg - Left: 0 6b Motor Leg - Right: 0 7 Limb Ataxia: 0 8 Sensory: 0 9 Best Language: 1 10 Dysarthria: 0 11 Extinct. and Inatten.: 0 TOTAL: 2   ASSESSMENT/PLAN  Victoria Guzman is a 58 y.o. female with history of  anxiety, hypertension, insomnia, depression, known  history of left parietal infarct who presented to the ED with slurred speech, some expressive aphasia.  She reports that her symptoms started around noon on 03/25/2024 while she was in [place].  She was evaluated at a hospital in [place] and was diagnosed with a ischemic stroke but she was unable to afford treatment so [place] left and [place]  and came to the hospital right away.  She had CT angio of the head and neck which  demonstrated progression of the left MCA short segment M1 stenosis .  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  CTA head & neck  Progression of high-grade stenosis of the left MCA with short segment occlusion of the distal M1 segment.  MRI  Multifocal acute ischemia within the anterior left MCA territory and at the right basal ganglia 2D Echo ordered LDL ordered HgbA1c ordered Will likely need a loop recorder at discharge UDS ordered VTE prophylaxis -Lovenox  No antithrombotic prior to admission, now on aspirin  81 mg daily and clopidogrel  75 mg daily for 3 weeks and then aspirin  alone. 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  In 2015   Hypertension Home meds: Lisinopril -HCTZ 20-12.5 mg Stable Blood Pressure Goal: SBP less than 160   Hyperlipidemia Home meds: None LDL ordered, goal < 70 Add atorvastatin  40 Continue statin at discharge  Diabetes type II Controlled Home meds: None HgbA1c ordered goal < 7.0 CBGs SSI Recommend close follow-up with PCP for better DM control   Dysphagia Patient has post-stroke dysphagia, SLP consulted    Diet   Diet Heart Room service appropriate? Yes; Fluid consistency: Thin   Advance diet as tolerated  Other Stroke Risk Factors ETOH use, alcohol level <15, advised to drink no more than 1 drink(s) a day Obesity, There is no height or weight on file to calculate BMI., BMI >/= 30 associated with increased stroke risk, recommend weight loss, diet and exercise as appropriate    Other Active Problems Anxiety and depression Insomnia Panic attacks Vitamin D  deficiency  Hospital day # 0  Jonette Nestle DNP, ACNPC-AG  [facility]  I have personally obtained history,examined this patient, reviewed notes,  independently viewed imaging studies, participated in medical decision making and plan of care.ROS completed by me personally and pertinent positives fully documented  I have made any additions or clarifications directly to the above note. Agree with note above.  Patient presented with sudden onset of expressive aphasia and right facial weakness due to left MCA infarct with CT angiogram showing progressive left MCA stenosis with short segment occlusion with good distal flow.  Recommend dual antiplatelet therapy aspirin  and Plavix  for 3 months followed by aspirin  alone and aggressive risk factor modification.  Speech therapy consults.  Long discussion with patient and daughter at the bedside and answered questions.  Continue ongoing stroke workup.  Greater than 50% time during this 50-minute visit was spent on counseling and coordination of care and discussion with patient and daughter and care team and answering questions.  Ardella Beaver, MD Medical Director [facility] Pager: [phone] 03/27/2024 2:08 PM   To contact Stroke Continuity provider, please refer to [email or web address]. After hours, contact General Neurology

## 2024-03-27 NOTE — H&P (Signed)
 History and Physical    Patient: Victoria Guzman UXL:[id number] DOB: 02-09-1966 DOA: 03/26/2024 DOS: the patient was seen and examined on 03/27/2024 PCP: Little Riff, MD  Patient coming from: Home  Chief Complaint:  Chief Complaint  Patient presents with   Dysphagia   HPI: Victoria Guzman is a 58 y.o. female with medical history significant of [place] stroke 2015, fibromyalgia, anemia, hypertension.  Patient was recently vacationing in [place] when she developed strokelike symptoms.  Unfortunately she was unable to pay for her health care and stroke care treatment in [place] therefore opted to return home to [facility] for care.  She developed symptoms around noon on 03/25/24 while in [place].  At time of presentation to the ED here her BP was 177/119 and she was not hypoxic.  She continued to have right facial droop, self-report of right side numbness and tingling and right-sided weakness of upper and lower extremities.  CT head demonstrated old left basal ganglia and insular infarct.  CTA of the head and neck revealed progression of high-grade stenosis of the left MCA with short segment occlusion of the distal M1 segment with good collateral flow distally.  MR brain without contrast revealed multifocal acute ischemia within the anterior left MCA territory and right basal ganglia.  No hemorrhage or mass effect.  Neurology has seen the patient and has made routine stroke recommendations.  Hospitalist service has been asked to evaluate the patient for admission.  Upon my evaluation of the patient she continued to have right-sided facial droop, intermittent issues with stuttering expressive aphasia, subtle right upper extremity weakness most notable with grip although the right upper extremity flexion and extension strength within normal.  No right lower extremity weakness.  No drooling.  Review of Systems: As mentioned in the history of present illness. All other systems reviewed and are  negative.   Past Medical History:  Diagnosis Date   Anemia    Anxiety    Chronic fatigue    Chronic insomnia 05/31/2016   Depression    Family history of adverse reaction to anesthesia    mother difficult to wake up after CABG   Fibromyalgia    Hypertension    Insomnia    Panic attack    Stroke [facility])    tia   Vitamin D  deficiency 05/31/2016   Past Surgical History:  Procedure Laterality Date   ABDOMINAL HYSTERECTOMY     COLONOSCOPY WITH PROPOFOL  N/A 07/24/2018   Procedure: COLONOSCOPY WITH PROPOFOL ;  Surgeon: Toledo, Alphonsus Jeans, MD;  Location: [facility] ENDOSCOPY;  Service: Gastroenterology;  Laterality: N/A;   ESOPHAGOGASTRODUODENOSCOPY (EGD) WITH PROPOFOL  N/A 07/24/2018   Procedure: ESOPHAGOGASTRODUODENOSCOPY (EGD) WITH PROPOFOL ;  Surgeon: Toledo, Alphonsus Jeans, MD;  Location: [facility] ENDOSCOPY;  Service: Gastroenterology;  Laterality: N/A;   LAPAROSCOPIC BILATERAL SALPINGECTOMY Bilateral 01/25/2019   Procedure: LAPAROSCOPIC BILATERAL SALPINGECTOMY;  Surgeon: Schermerhorn, Joselyn Nicely, MD;  Location: [facility] ORS;  Service: Gynecology;  Laterality: Bilateral;   NASAL TURBINATE REDUCTION Bilateral 06/14/2022   Procedure: INFERIOR TURBINATE REDUCTION;  Surgeon: Mellody Sprout, MD;  Location: [facility];  Service: ENT;  Laterality: Bilateral;   PARATHYROIDECTOMY     PARTIAL HYSTERECTOMY     SEPTOPLASTY N/A 06/14/2022   Procedure: SEPTOPLASTY;  Surgeon: Mellody Sprout, MD;  Location: [facility];  Service: ENT;  Laterality: N/A;   TRACHELECTOMY N/A 01/25/2019   Procedure: TRACHELECTOMY, laparoscopic cervical;  Surgeon: Carolynn Citrin, MD;  Location: [facility] ORS;  Service: Gynecology;  Laterality: N/A;   Social History:  reports  that she has never smoked. She has never used smokeless tobacco. She reports current alcohol use of about 1.0 standard drink of alcohol per week. She reports that she does not use drugs.  Allergies  Allergen Reactions   Iodinated Contrast Media Cough    Mild  hypoxia, wheezing, throat edema feeling shortly after contrast 03/27/24. Improved without treatment, patient doesn't think it was an allergic reaction and refused all meds. Did improve without them.    Clonidine  Derivatives Hives   Quinolones Hives and Swelling   Trazodone Other (See Comments)    Caused sedation. Felt depressed while on medication.   Zofran  [Ondansetron  Hcl] Itching, Swelling and Other (See Comments)    swelling of lips    Family History  Problem Relation Age of Onset   Diabetes Mother    Heart disease Mother    Kidney disease Mother        dialysis x 7 years   Stroke Mother        tiny spot just found incidentally on scan   Cancer Father        multiple myeloma   Breast cancer Paternal Aunt 36    Prior to Admission medications   Medication Sig Start Date End Date Taking? Authorizing Provider  acetaminophen  (TYLENOL ) 650 MG CR tablet Take 1,300 mg by mouth every 8 (eight) hours as needed for pain.   Yes [provider]  albuterol  (PROAIR  HFA) 108 (90 Base) MCG/ACT inhaler Inhale 2 puffs into the lungs every 4 (four) hours as needed for wheezing or shortness of breath. 11/17/22  Yes Ghimire, Letty Raya, MD  clonazePAM  (KLONOPIN ) 1 MG tablet Take 1 mg by mouth at bedtime. 02/28/24  Yes [provider]  lisinopril -hydrochlorothiazide  (PRINZIDE ,ZESTORETIC ) 20-12.5 MG tablet Take 1 tablet by mouth 2 (two) times daily. 08/14/18  Yes [provider]  oxyCODONE -acetaminophen  (PERCOCET) 10-325 MG tablet Take 1 tablet by mouth every 8 (eight) hours as needed for pain. 02/25/24  Yes [provider]  promethazine  (PHENERGAN ) 25 MG tablet Take 25 mg by mouth every 8 (eight) hours as needed for nausea or vomiting. 05/04/23  Yes [provider]  tiZANidine  (ZANAFLEX ) 4 MG tablet Take 4 mg by mouth at bedtime. 01/12/24  Yes [provider]  zolpidem  (AMBIEN ) 10 MG tablet Take 10 mg by mouth at bedtime. 08/07/18  Yes [provider]     Physical Exam: Vitals:   03/27/24 0500 03/27/24 0630 03/27/24 0648 03/27/24 0730  BP: (!) 142/87 (!) 158/120 (!) 167/103 (!) 164/69  Pulse: 84 81 87 80  Resp: 18 (!) 21 19 (!) 21  Temp:   98.3 F (36.8 C)   TempSrc:   Oral   SpO2: 100% 95% 97% 100%   Constitutional: NAD, calm, comfortable Respiratory: clear to auscultation bilaterally, no wheezing, no crackles. Normal respiratory effort. No accessory muscle use.  Cardiovascular: Regular rate and rhythm, no murmurs / rubs / gallops. No extremity edema. 2+ pedal pulses. No carotid bruits.  Abdomen: no tenderness, no masses palpated. No hepatosplenomegaly. Bowel sounds positive.  Musculoskeletal: no clubbing / cyanosis. No joint deformity upper and lower extremities. Good ROM, no contractures. Normal muscle tone.  Skin: no rashes, lesions, ulcers. No induration Neurologic: CN 2-12 grossly intact although she still has some right facial droop present. Sensation intact, DTR normal. Strength 5/5 x all 4 extremities with only subtle right upper extremity weakness primarily noticed as diminished right hand grip-seems to have a degree of intermittent expressive aphasia with word finding difficulty  Psychiatric: Normal judgment and insight. Alert and oriented x 3. Normal mood.    Data Reviewed:  Sodium 140, potassium 3.5, glucose 107, BUN 20, creatinine 1.7  WBC 9300 with normal differential, hemoglobin 13.7, platelets 325,000  Coags normal  Alcohol level <15  Imaging as above in HPI  Assessment and Plan: History of CVA 2015 now with acute embolic CVA Symptoms began while in [place] on 4/21 and [place] the [place]  for treatment on 4/22 CT head demonstrated primarily prior infarcts CTA head and neck with progression of high-grade stenosis of the left MCA with short segment occlusion of the distal M1 segment although there is good collateral flow distally MRI revealed multiple areas of infarcts involving the left MCA region  consistent with embolic stroke-likely will need full dose anticoagulation at time of discharge as long as there is no hemorrhagic conversion.  For now we will use aspirin  and Plavix  as recommended by neurology Obtain echocardiogram Obtain lipid panel and hemoglobin A1c-neurology recommends statin if LDL greater than 70 Permissive hypertension PT/OT/SLP evaluation Passed bedside swallow exam Continue telemetry Neurochecks every 2 hours  Hypertension Hold preadmission lisinopril  hydrochlorothiazide  in context of permissive hypertension  Anemia Current hemoglobin stable at 13.7 Was not on iron prior to admission  Fibromyalgia Hold home Zanaflex  for now in the event this medication can blunt neurological exam findings  Anxiety disorder/insomnia Will hold home Ambien  and clonazepam  until cleared by neurology    Advance Care Planning:   Code Status: Full Code   VTE prophylaxis: Lovenox   Consults: Neurology  Family Communication: Daughter at bedside  Severity of Illness: The appropriate patient status for this patient is OBSERVATION. Observation status is judged to be reasonable and necessary in order to provide the required intensity of service to ensure the patient's safety. The patient's presenting symptoms, physical exam findings, and initial radiographic and laboratory data in the context of their medical condition is felt to place them at decreased risk for further clinical deterioration. Furthermore, it is anticipated that the patient will be medically stable for discharge from the hospital within 2 midnights of admission.   Author: Kathye Parkin, NP 03/27/2024 9:28 AM  For on call review [email or web address].

## 2024-03-27 NOTE — ED Notes (Signed)
 Appropriate sized cuff placed on pt. New bp reading obtained.

## 2024-03-27 NOTE — Care Management Obs Status (Signed)
 MEDICARE OBSERVATION STATUS NOTIFICATION   Patient Details  Name: Victoria Guzman MRN: [id number] Date of Birth: 1966/02/20   Medicare Observation Status Notification Given:  Yes    Jonise Weightman, RN 03/27/2024, 10:34 PM

## 2024-03-28 ENCOUNTER — Other Ambulatory Visit (HOSPITAL_COMMUNITY): Payer: Self-pay

## 2024-03-28 ENCOUNTER — Telehealth: Payer: Self-pay | Admitting: *Deleted

## 2024-03-28 DIAGNOSIS — I10 Essential (primary) hypertension: Secondary | ICD-10-CM | POA: Diagnosis not present

## 2024-03-28 DIAGNOSIS — I639 Cerebral infarction, unspecified: Secondary | ICD-10-CM | POA: Diagnosis not present

## 2024-03-28 DIAGNOSIS — E785 Hyperlipidemia, unspecified: Secondary | ICD-10-CM | POA: Diagnosis not present

## 2024-03-28 DIAGNOSIS — E119 Type 2 diabetes mellitus without complications: Secondary | ICD-10-CM | POA: Diagnosis not present

## 2024-03-28 LAB — BASIC METABOLIC PANEL WITH GFR
Anion gap: 13 (ref 5–15)
BUN: 18 mg/dL (ref 6–20)
CO2: 24 mmol/L (ref 22–32)
Calcium: 9.2 mg/dL (ref 8.9–10.3)
Chloride: 100 mmol/L (ref 98–111)
Creatinine, Ser: 1.77 mg/dL — ABNORMAL HIGH (ref 0.44–1.00)
GFR, Estimated: 33 mL/min — ABNORMAL LOW (ref >60)
Glucose, Bld: 106 mg/dL — ABNORMAL HIGH (ref 70–99)
Potassium: 3.3 mmol/L — ABNORMAL LOW (ref 3.5–5.1)
Sodium: 137 mmol/L (ref 135–145)

## 2024-03-28 LAB — HEMOGLOBIN A1C
Hgb A1c MFr Bld: 5.2 % (ref 4.8–5.6)
Mean Plasma Glucose: 102.54 mg/dL

## 2024-03-28 LAB — LIPID PANEL
Cholesterol: 168 mg/dL (ref 0–200)
HDL: 79 mg/dL (ref >40)
LDL Cholesterol: 72 mg/dL (ref 0–99)
Total CHOL/HDL Ratio: 2.1 ratio
Triglycerides: 85 mg/dL (ref <150)
VLDL: 17 mg/dL (ref 0–40)

## 2024-03-28 MED ORDER — POTASSIUM CHLORIDE CRYS ER 20 MEQ PO TBCR
40.0000 meq | EXTENDED_RELEASE_TABLET | Freq: Once | ORAL | Status: AC
  Administered 2024-03-28: 40 meq via ORAL
  Filled 2024-03-28: qty 2

## 2024-03-28 MED ORDER — ASPIRIN 81 MG PO TBEC
81.0000 mg | DELAYED_RELEASE_TABLET | Freq: Every day | ORAL | 11 refills | Status: AC
  Filled 2024-03-28: qty 30, 30d supply, fill #0

## 2024-03-28 MED ORDER — ATORVASTATIN CALCIUM 40 MG PO TABS
40.0000 mg | ORAL_TABLET | Freq: Every day | ORAL | 0 refills | Status: AC
  Filled 2024-03-28: qty 90, 90d supply, fill #0

## 2024-03-28 MED ORDER — CLOPIDOGREL BISULFATE 75 MG PO TABS
75.0000 mg | ORAL_TABLET | Freq: Every day | ORAL | 0 refills | Status: AC
  Filled 2024-03-28: qty 90, 90d supply, fill #0

## 2024-03-28 NOTE — Evaluation (Signed)
 Occupational Therapy Evaluation Patient Details Name: Victoria Guzman MRN: [id number] DOB: [birth year] Today's Date: 03/28/2024   History of Present Illness   Patient is a 58 year old female presenting with trouble speaking and right arm weakness. 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 History of stroke, anxiety, HTN, insomnia, depression.     Clinical Impressions PTA, pt lives with daughter and typically completely independent without need for AD. Pt presents now with deficits in R hand strength, coordination and speech. Pt requires overall Min A for ADL mgmt. Extended time spent educating on ADL modifications (use of built up grips for utensils, etc) and exercises for R hand function (handouts, putty and squeeze sponge provided). Pt's R hand function appeared to improve during session with functional grasp of ADL items and use of L hand to mirror exercises. Pt's daughter able to provide assist at home, both interested in [facility] follow up, as well as HH speech.      If plan is discharge home, recommend the following:   A little help with bathing/dressing/bathroom;Assistance with cooking/housework;Assist for transportation     Functional Status Assessment   Patient has had a recent decline in their functional status and demonstrates the ability to make significant improvements in function in a reasonable and predictable amount of time.     Equipment Recommendations   None recommended by OT     Recommendations for Other Services         Precautions/Restrictions   Precautions Precautions: Fall Recall of Precautions/Restrictions: Intact Restrictions Weight Bearing Restrictions Per Provider Order: No     Mobility Bed Mobility Overal bed mobility: Modified Independent                  Transfers                          Balance                                           ADL either performed or assessed with  clinical judgement   ADL Overall ADL's : Needs assistance/impaired Eating/Feeding: Set up;Sitting Eating/Feeding Details (indicate cue type and reason): placed built up grip on utensil and discussed grasp options w/ pt able to demo scooping food though difficult. alternating with L hand use Grooming: Minimal assistance;Sitting   Upper Body Bathing: Minimal assistance;Sitting   Lower Body Bathing: Minimal assistance;Sit to/from stand   Upper Body Dressing : Minimal assistance   Lower Body Dressing: Minimal assistance   Toilet Transfer: Supervision/safety;Ambulation   Toileting- Clothing Manipulation and Hygiene: Minimal assistance;Sitting/lateral lean;Sit to/from stand Toileting - Clothing Manipulation Details (indicate cue type and reason): reports difficulty wiping self. encouraged use of wet wipes to maximize thoroughness, bidet attachments on [employer] if needed     Functional mobility during ADLs: Supervision/safety       Vision Ability to See in Adequate Light: 0 Adequate Patient Visual Report: No change from baseline Vision Assessment?: No apparent visual deficits     Perception         Praxis         Pertinent Vitals/Pain Pain Assessment Pain Assessment: No/denies pain     Extremity/Trunk Assessment Upper Extremity Assessment Upper Extremity Assessment: Right hand dominant;RUE deficits/detail RUE Deficits / Details: AROM WFL for shoulders, elbow and wrist. R hand/digits strength impaired. Initially unable to  make a fist or fully extend digits but with mirroring using L hand and AAROM from OT, pt able to progress to holding ADL items (cup, banana) though difficulty holding smaller items like toothbrush - provided built up grips to trial RUE Sensation: decreased light touch RUE Coordination: decreased fine motor   Lower Extremity Assessment Lower Extremity Assessment: Defer to PT evaluation RLE Deficits / Details: 5/5 dorsiflexion, plantarflexion, knee extension,  hip add/abd RLE Sensation:  (can detect light touch but reports feeling decreased compared to left side) RLE Coordination: WNL   Cervical / Trunk Assessment Cervical / Trunk Assessment: Normal   Communication Communication Communication: Impaired Factors Affecting Communication: Difficulty expressing self;Reduced clarity of speech   Cognition Arousal: Alert Behavior During Therapy: WFL for tasks assessed/performed Cognition: No apparent impairments                               Following commands: Intact       Cueing  General Comments   Cueing Techniques: Verbal cues  patient is requesting home health. patient reports having difficulty with all ADLs secondary to RUE weakness. recommend OT evaluation   Exercises Exercises: Other exercises Other Exercises Other Exercises: A/AROM R hand flexion/extension and opposition Other Exercises: squeeze sponge Other Exercises: self ROM and mirroring exercises with L hand Other Exercises: putty exercises   Shoulder Instructions      Home Living Family/patient expects to be discharged to:: Private residence Living Arrangements: Children Available Help at Discharge: Family Type of Home: House Home Access: Stairs to enter     Home Layout: One level     Bathroom Shower/Tub: Chief Strategy Officer: Standard     Home Equipment: None          Prior Functioning/Environment Prior Level of Function : Independent/Modified Independent;Driving             Mobility Comments: independent without device ADLs Comments: Indep with ADLs, IADLs    OT Problem List: Decreased strength;Decreased coordination;Impaired sensation;Impaired UE functional use;Decreased knowledge of use of DME or AE   OT Treatment/Interventions: Therapeutic exercise;Self-care/ADL training;Energy conservation;DME and/or AE instruction;Therapeutic activities;Patient/family education;Balance training      OT Goals(Current goals can  be found in the care plan section)   Acute Rehab OT Goals Patient Stated Goal: regain R hand function, be able to wipe self, get speech back to normal OT Goal Formulation: With patient/family Time For Goal Achievement: 04/11/24 Potential to Achieve Goals: Good   OT Frequency:  Min 2X/week    Co-evaluation              AM-PAC OT "6 Clicks" Daily Activity     Outcome Measure Help from another person eating meals?: A Little Help from another person taking care of personal grooming?: A Little Help from another person toileting, which includes using toliet, bedpan, or urinal?: A Little Help from another person bathing (including washing, rinsing, drying)?: A Little Help from another person to put on and taking off regular upper body clothing?: A Little Help from another person to put on and taking off regular lower body clothing?: A Little 6 Click Score: 18   End of Session Nurse Communication: Mobility status  Activity Tolerance: Patient tolerated treatment well Patient left: in bed;with call bell/phone within reach;with family/visitor present  OT Visit Diagnosis: Muscle weakness (generalized) (M62.81);Hemiplegia and hemiparesis Hemiplegia - Right/Left: Right Hemiplegia - dominant/non-dominant: Dominant Hemiplegia - caused by: Cerebral infarction  Time: 2130-8657 OT Time Calculation (min): 46 min Charges:  OT General Charges $OT Visit: 1 Visit OT Evaluation $OT Eval Low Complexity: 1 Low OT Treatments $Self Care/Home Management : 8-22 mins $Therapeutic Exercise: 8-22 mins  Lawrence Pretty, OTR/L Acute Rehab Services Office: [phone]   Annabella Barr 03/28/2024, 1:10 PM

## 2024-03-28 NOTE — ED Notes (Signed)
 Patient verbalized understanding of discharge instructions, patient verbalized understanding that homehealth nurse will call them. No other concerns at this time.

## 2024-03-28 NOTE — Progress Notes (Signed)
 STROKE TEAM PROGRESS NOTE   INTERIM HISTORY/SUBJECTIVE Her daughter is at the bedside.  Patient is laying in the bed in no apparent distress She continues to have mild expressive aphasia and left facial weakness.  Neurological exam is stable. OBJECTIVE  CBC    Component Value Date/Time   WBC 9.3 03/26/2024 2146   RBC 4.71 03/26/2024 2146   HGB 15.0 03/26/2024 2155   HGB 12.4 10/12/2015 1523   HCT 44.0 03/26/2024 2155   HCT 38.0 10/12/2015 1523   PLT 325 03/26/2024 2146   PLT 377 10/12/2015 1523   MCV 87.5 03/26/2024 2146   MCV 82 10/12/2015 1523   MCV 82 04/22/2014 1208   MCH 29.1 03/26/2024 2146   MCHC 33.3 03/26/2024 2146   RDW 13.7 03/26/2024 2146   RDW 15.2 10/12/2015 1523   RDW 15.4 (H) 04/22/2014 1208   LYMPHSABS 1.4 03/26/2024 2146   LYMPHSABS 1.1 04/22/2014 1208   MONOABS 0.7 03/26/2024 2146   MONOABS 0.6 04/22/2014 1208   EOSABS 0.4 03/26/2024 2146   EOSABS 0.7 04/22/2014 1208   BASOSABS 0.1 03/26/2024 2146   BASOSABS 0.1 04/22/2014 1208    BMET    Component Value Date/Time   NA 137 03/28/2024 0531   NA 135 (L) 10/12/2015 1523   NA 137 04/22/2014 1208   K 3.3 (L) 03/28/2024 0531   K 3.5 04/22/2014 1208   CL 100 03/28/2024 0531   CL 103 04/22/2014 1208   CO2 24 03/28/2024 0531   CO2 30 04/22/2014 1208   GLUCOSE 106 (H) 03/28/2024 0531   GLUCOSE 115 (H) 04/22/2014 1208   BUN 18 03/28/2024 0531   BUN 8 10/12/2015 1523   BUN 9 04/22/2014 1208   CREATININE 1.77 (H) 03/28/2024 0531   CREATININE 0.78 05/31/2016 1241   CALCIUM  9.2 03/28/2024 0531   CALCIUM  10.7 (H) 04/22/2014 1208   GFRNONAA 33 (L) 03/28/2024 0531   GFRNONAA >89 05/31/2016 1241    IMAGING past 24 hours ECHOCARDIOGRAM COMPLETE Result Date: 03/27/2024    ECHOCARDIOGRAM REPORT   Patient Name:   Victoria Guzman Date of Exam: 03/27/2024 Medical Rec #:  [id number]         Height:       66.0 in Accession #:    [id number]        Weight:       175.0 lb Date of Birth:  [birth year]         BSA:           1.890 m Patient Age:    58 years          BP:           138/89 mmHg Patient Gender: F                 HR:           82 bpm. Exam Location:  Inpatient Procedure: 2D Echo, Cardiac Doppler and Color Doppler (Both Spectral and Color            Flow Doppler were utilized during procedure). Indications:    Stoke  History:        Patient has prior history of Echocardiogram examinations, most                 recent 11/14/2022. Stroke; Risk Factors:Hypertension.  Sonographer:    Alicia Inoue Referring Phys: [id number] ALLISON L ELLIS IMPRESSIONS  1. Left ventricular ejection fraction, by estimation, is 60 to 65%. The left ventricle has normal  function. The left ventricle has no regional wall motion abnormalities. There is mild concentric left ventricular hypertrophy. Left ventricular diastolic parameters are consistent with Grade I diastolic dysfunction (impaired relaxation).  2. Right ventricular systolic function is normal. The right ventricular size is normal. Tricuspid regurgitation signal is inadequate for assessing PA pressure.  3. The mitral valve is normal in structure. No evidence of mitral valve regurgitation. No evidence of mitral stenosis.  4. The aortic valve is normal in structure. Aortic valve regurgitation is trivial. No aortic stenosis is present. Aortic valve area, by VTI measures 2.13 cm. Aortic valve mean gradient measures 9.0 mmHg. Aortic valve Vmax measures 2.10 m/s.  5. The inferior vena cava is normal in size with greater than 50% respiratory variability, suggesting right atrial pressure of 3 mmHg. FINDINGS  Left Ventricle: Left ventricular ejection fraction, by estimation, is 60 to 65%. The left ventricle has normal function. The left ventricle has no regional wall motion abnormalities. The left ventricular internal cavity size was normal in size. There is  mild concentric left ventricular hypertrophy. Left ventricular diastolic parameters are consistent with Grade I diastolic dysfunction (impaired  relaxation). Right Ventricle: The right ventricular size is normal. No increase in right ventricular wall thickness. Right ventricular systolic function is normal. Tricuspid regurgitation signal is inadequate for assessing PA pressure. Left Atrium: Left atrial size was normal in size. Right Atrium: Right atrial size was normal in size. Pericardium: There is no evidence of pericardial effusion. Mitral Valve: The mitral valve is normal in structure. No evidence of mitral valve regurgitation. No evidence of mitral valve stenosis. Tricuspid Valve: The tricuspid valve is normal in structure. Tricuspid valve regurgitation is trivial. No evidence of tricuspid stenosis. Aortic Valve: The aortic valve is normal in structure. Aortic valve regurgitation is trivial. No aortic stenosis is present. Aortic valve mean gradient measures 9.0 mmHg. Aortic valve peak gradient measures 17.6 mmHg. Aortic valve area, by VTI measures 2.13 cm. Pulmonic Valve: The pulmonic valve was normal in structure. Pulmonic valve regurgitation is not visualized. No evidence of pulmonic stenosis. Aorta: The aortic root is normal in size and structure. Venous: The inferior vena cava is normal in size with greater than 50% respiratory variability, suggesting right atrial pressure of 3 mmHg. IAS/Shunts: No atrial level shunt detected by color flow Doppler.  LEFT VENTRICLE PLAX 2D LVIDd:         3.20 cm     Diastology LVIDs:         2.10 cm     LV e' medial:    4.99 cm/s LV PW:         1.30 cm     LV E/e' medial:  14.7 LV IVS:        1.20 cm     LV e' lateral:   5.96 cm/s LVOT diam:     2.20 cm     LV E/e' lateral: 12.3 LV SV:         80 LV SV Index:   42 LVOT Area:     3.80 cm  LV Volumes (MOD) LV vol d, MOD A2C: 64.0 ml LV vol d, MOD A4C: 52.8 ml LV vol s, MOD A2C: 26.2 ml LV vol s, MOD A4C: 14.9 ml LV SV MOD A2C:     37.8 ml LV SV MOD A4C:     52.8 ml LV SV MOD BP:      36.7 ml RIGHT VENTRICLE  IVC RV S prime:     13.10 cm/s  IVC diam: 1.90  cm TAPSE (M-mode): 1.9 cm LEFT ATRIUM             Index LA Vol (A2C):   34.2 ml 18.10 ml/m LA Vol (A4C):   45.6 ml 24.13 ml/m LA Biplane Vol: 41.4 ml 21.91 ml/m  AORTIC VALVE AV Area (Vmax):    2.14 cm AV Area (Vmean):   2.14 cm AV Area (VTI):     2.13 cm AV Vmax:           210.00 cm/s AV Vmean:          141.000 cm/s AV VTI:            0.376 m AV Peak Grad:      17.6 mmHg AV Mean Grad:      9.0 mmHg LVOT Vmax:         118.00 cm/s LVOT Vmean:        79.300 cm/s LVOT VTI:          0.211 m LVOT/AV VTI ratio: 0.56  AORTA Ao Sinus diam: 2.90 cm MITRAL VALVE MV Area (PHT): 4.31 cm     SHUNTS MV Decel Time: 176 msec     Systemic VTI:  0.21 m MV E velocity: 73.30 cm/s   Systemic Diam: 2.20 cm MV A velocity: 103.00 cm/s MV E/A ratio:  0.71 Gaylyn Keas MD Electronically signed by Gaylyn Keas MD Signature Date/Time: 03/27/2024/3:47:23 PM    Final     Vitals:   03/28/24 0500 03/28/24 0915 03/28/24 0918 03/28/24 0920  BP:  114/78 114/78   Pulse: 83 86 (!) 103   Resp: 15 (!) 22 20   Temp:    98 F (36.7 C)  TempSrc:    Oral  SpO2: 96% 96% 97%   Weight:      Height:         PHYSICAL EXAM General:  Alert, Guzman-nourished, Guzman-developed patient in no acute distress Psych:  Mood and affect appropriate for situation CV: Regular rate and rhythm on monitor Respiratory:  Regular, unlabored respirations on room air GI: Abdomen soft and nontender   NEURO:  Mental Status: AA&Ox3, has some expressive aphasia.  Able to name objects and hold a conversation, can repeat  Cranial Nerves:  II: PERRL. Visual fields full.  III, IV, VI: EOMI. Eyelids elevate symmetrically.  V: Sensation is intact to light touch and symmetrical to face.  VII: Right facial droop VIII: hearing intact to voice. IX, X: Palate elevates symmetrically. Phonation is normal.  WG:[id number] shrug 5/5. XII: tongue is midline without fasciculations. Motor: 5/5 strength to all muscle groups tested.  Has weak grip on right Tone: is normal  and bulk is normal Sensation- Intact to light touch bilaterally. Extinction absent to light touch to DSS.   Coordination: FTN intact bilaterally, HKS: no ataxia in BLE.No drift.  Decreased fine motor skills on right hand Gait- deferred  Most Recent NIH  1a Level of Conscious.: 0 1b LOC Questions: 0 1c LOC Commands: 0 2 Best Gaze: 0 3 Visual: 0 4 Facial Palsy: 1 5a Motor Arm - left: 0 5b Motor Arm - Right: 0 6a Motor Leg - Left: 0 6b Motor Leg - Right: 0 7 Limb Ataxia: 0 8 Sensory: 0 9 Best Language: 1 10 Dysarthria: 0 11 Extinct. and Inatten.: 0 TOTAL: 2   ASSESSMENT/PLAN  [name] is a 58 y.o. female with history of  anxiety,  hypertension, insomnia, depression, known history of left parietal infarct who presented to the ED with slurred speech, some expressive aphasia.  She reports that her symptoms started around noon on 03/25/2024 while she was in [place].  She was evaluated at a hospital in [place] and was diagnosed with a ischemic stroke but she was unable to afford treatment so she left and flew to [place]  and came to the hospital right away.  She had CT angio of the head and neck which demonstrated progression of the left MCA short segment M1 stenosis .  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  CTA head & neck  Progression of high-grade stenosis of the left MCA with short segment occlusion of the distal M1 segment.  MRI  Multifocal acute ischemia within the anterior left MCA territory and at the right basal ganglia 2D Echo 60 to 65%.  Left atrial size normal. LDL 72 mg percent HgbA1c 5.2 Will likely need a loop recorder at discharge UDS ordered pending VTE prophylaxis -Lovenox  No antithrombotic prior to admission, now on aspirin  81 mg daily and clopidogrel  75 mg daily for 3 months and then aspirin  alone. 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  In 2015   Hypertension Home meds: Lisinopril -HCTZ 20-12.5 mg Stable Blood Pressure Goal: SBP less than 160   Hyperlipidemia Home meds: None LDL ordered, goal < 70 Add atorvastatin  40 Continue statin at discharge  Diabetes type II Controlled Home meds: None HgbA1c ordered goal < 7.0 CBGs SSI Recommend close follow-up with PCP for better DM control   Dysphagia Patient has post-stroke dysphagia, SLP consulted    Diet   Diet Heart Room service appropriate? Yes; Fluid consistency: Thin   Advance diet as tolerated  Other Stroke Risk Factors ETOH use, alcohol level <15, advised to drink no more than 1 drink(s) a day Obesity, Body mass index is 27.92 kg/m., BMI >/= 30 associated with increased stroke risk, recommend weight loss, diet and exercise as appropriate    Other Active Problems Anxiety and depression Insomnia Panic attacks Vitamin D  deficiency  Hospital day # 0   Patient presented with sudden onset of expressive aphasia and right facial weakness due to left MCA infarct with CT angiogram showing progressive left MCA stenosis with short segment occlusion with good distal flow.  Recommend dual antiplatelet therapy aspirin  and Plavix  for 3 months followed by aspirin  alone and aggressive risk factor modification.  Speech therapy consults.  Long discussion with patient and daughter at the bedside and answered questions.  Continue ongoing stroke workup.  Greater than 50% time during this 35-minute visit was spent on counseling and coordination of care and discussion with patient and daughter and care team and answering questions.  Discussed with Dr. Kayleen Party, MD Medical Director [facility] Pager: [phone] 03/28/2024 1:05 PM   To contact Stroke Continuity provider, please refer to [email or web address]. After hours, contact General  Neurology

## 2024-03-28 NOTE — Evaluation (Signed)
 Physical Therapy Evaluation and Discharge  Patient Details Name: Victoria Guzman MRN: [id number] DOB: 04-28-1966 Today's Date: 03/28/2024  History of Present Illness  Patient is a 58 year old female presenting with trouble speaking and right arm weakness. 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 History of stroke, anxiety, HTN, insomnia, depression.  Clinical Impression  Patient is agreeable to PT evaluation. She is independent at baseline, drives, and lives at home with her daughter.  Today the patient has RUE weakness. She reports difficulty with ADLs compared to baseline. She is Mod I for bed mobility and supervision for transfers and ambulation. Patient walked in hallway with no loss of balance without device. No dizziness reported and vitals stable throughout session, however patient does report feeling generally fatigued. Discharge is anticipated today. Patient is hopeful to have home health services but could benefit from eventual outpatient follow up for RUE deficits. PT will sign off at this time.       If plan is discharge home, recommend the following: Help with stairs or ramp for entrance; Assist for transportation    Can travel by private vehicle        Equipment Recommendations None recommended by PT  Recommendations for Other Services  OT consult    Functional Status Assessment Patient has had a recent decline in their functional status and demonstrates the ability to make significant improvements in function in a reasonable and predictable amount of time.     Precautions / Restrictions Precautions Precautions: Fall Restrictions Weight Bearing Restrictions Per Provider Order: No      Mobility  Bed Mobility Overal bed mobility: Modified Independent                  Transfers Overall transfer level: Needs assistance Equipment used: None Transfers: Sit to/from Stand Sit to Stand: Supervision                 Ambulation/Gait Ambulation/Gait assistance: Supervision Gait Distance (Feet): 150 Feet Assistive device: None Gait Pattern/deviations: Step-through pattern Gait velocity: decreased     General Gait Details: patient walked around the unit with no loss of balance. no dizziness reported with mobility. patient is fatigued with mobility  Stairs            Wheelchair Mobility     Tilt Bed    Modified Rankin (Stroke Patients Only) Modified Rankin (Stroke Patients Only) Pre-Morbid Rankin Score: No symptoms Modified Rankin: Slight disability     Balance Overall balance assessment: Needs assistance Sitting-balance support: Feet supported Sitting balance-Leahy Scale: Good     Standing balance support: No upper extremity supported Standing balance-Leahy Scale: Good                               Pertinent Vitals/Pain Pain Assessment Pain Assessment: No/denies pain    Home Living Family/patient expects to be discharged to:: Private residence Living Arrangements: Children Available Help at Discharge: Family Type of Home: House Home Access: Stairs to enter                Prior Function Prior Level of Function : Independent/Modified Independent;Driving             Mobility Comments: independent without device       Extremity/Trunk Assessment   Upper Extremity Assessment Upper Extremity Assessment: Defer to OT evaluation;Right hand dominant (RUE strength and fine motor coordination deficits observed with functional activity)  Lower Extremity Assessment Lower Extremity Assessment: RLE deficits/detail;LLE deficits/detail RLE Deficits / Details: 5/5 dorsiflexion, plantarflexion, knee extension, hip add/abd RLE Sensation:  (can detect light touch but reports feeling decreased compared to left side) RLE Coordination: WNL       Communication   Communication Communication: Impaired Factors Affecting Communication: Difficulty expressing  self;Reduced clarity of speech    Cognition Arousal: Alert Behavior During Therapy: WFL for tasks assessed/performed   PT - Cognitive impairments: No apparent impairments                         Following commands: Intact       Cueing Cueing Techniques: Verbal cues     General Comments General comments (skin integrity, edema, etc.): patient is requesting home health. patient reports having difficulty with all ADLs secondary to RUE weakness. recommend OT evaluation    Exercises     Assessment/Plan    PT Assessment All further PT needs can be met in the next venue of care  PT Problem List         PT Treatment Interventions      PT Goals (Current goals can be found in the Care Plan section)  Acute Rehab PT Goals PT Goal Formulation: All assessment and education complete, DC therapy    Frequency       Co-evaluation               AM-PAC PT "6 Clicks" Mobility  Outcome Measure Help needed turning from your back to your side while in a flat bed without using bedrails?: None Help needed moving from lying on your back to sitting on the side of a flat bed without using bedrails?: None Help needed moving to and from a bed to a chair (including a wheelchair)?: None Help needed standing up from a chair using your arms (e.g., wheelchair or bedside chair)?: A Little Help needed to walk in hospital room?: A Little Help needed climbing 3-5 steps with a railing? : A Little 6 Click Score: 21    End of Session   Activity Tolerance: Patient tolerated treatment well Patient left: in bed;with call bell/phone within reach;with family/visitor present Nurse Communication: Mobility status PT Visit Diagnosis: Hemiplegia and hemiparesis Hemiplegia - Right/Left: Right Hemiplegia - dominant/non-dominant: Dominant Hemiplegia - caused by: Cerebral infarction    Time: 1610-9604 PT Time Calculation (min) (ACUTE ONLY): 22 min   Charges:   PT Evaluation $PT Eval Moderate  Complexity: 1 Mod   PT General Charges $$ ACUTE PT VISIT: 1 Visit         Ozie Bo, PT, MPT   Erlene Hawks 03/28/2024, 12:10 PM

## 2024-03-28 NOTE — Discharge Summary (Signed)
 [facility]  Physician Discharge Summary   Patient ID: Victoria Guzman MRN: [id number] DOB/AGE: [birth year] 58 y.o.  Admit date: 03/26/2024 Discharge date:   03/28/2024   PCP: Little Riff, MD  DISCHARGE DIAGNOSES:    Acute ischemic stroke [facility]) Hyperlipidemia   RECOMMENDATIONS FOR OUTPATIENT FOLLOW UP: Ambulatory referral sent to neurology Monitor renal function in the outpatient setting.   Home Health: Waiting on PT and OT evaluation Equipment/Devices: None  CODE STATUS: Full code  DISCHARGE CONDITION: fair  Diet recommendation: Heart healthy  INITIAL HISTORY: 58 y.o. female with medical history significant of [place] stroke 2015, fibromyalgia, anemia, hypertension.  Patient was recently vacationing in [place] when she developed strokelike symptoms.  Unfortunately she was unable to pay for her health care and stroke care treatment in [place] therefore opted to return home to [facility] for care.  She developed symptoms around noon on 03/25/24 while in [place].  At time of presentation to the ED here her BP was 177/119 and she was not hypoxic.  She continued to have right facial droop, self-report of right side numbness and tingling and right-sided weakness of upper and lower extremities.  CT head demonstrated old left basal ganglia and insular infarct.  CTA of the head and neck revealed progression of high-grade stenosis of the left MCA with short segment occlusion of the distal M1 segment with good collateral flow distally.  MR brain without contrast revealed multifocal acute ischemia within the anterior left MCA territory and right basal ganglia.  No hemorrhage or mass effect.  Neurology has seen the patient and has made routine stroke recommendations.  Hospitalist service has been asked to evaluate the patient for admission.   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 Echocardiogram shows normal LVEF.  Grade 1 diastolic dysfunction noted.  No embolic source identified. LDL is 72.  Started on statin. HbA1c 5.2. Seen by neurology.  Aspirin  and Plavix  recommended for 3 months.  Patient was not taking any antiplatelets prior to this admission.  She will need to take aspirin  indefinitely. Await PT and OT eval prior to discharge.   Essential hypertension/chronic kidney disease stage IIIb/hypokalemia May resume her home medications.  Her creatinine is noted to be elevated.  She does have a history of chronic kidney disease.  This can be monitored closely in the outpatient setting. Supplement potassium prior to discharge   Fibromyalgia   Anxiety disorder/insomnia  Patient stable.  Okay for discharge home today once cleared by PT and OT.  PERTINENT LABS:  The results of significant diagnostics from this hospitalization (including imaging, microbiology, ancillary and laboratory) are listed below for reference.     Labs:   Basic Metabolic Panel: Recent Labs  Lab 03/26/24 2146 03/26/24 2155 03/28/24 0531  NA 139 140 137  K 3.6 3.5 3.3*  CL 100 102 100  CO2 27  --  24  GLUCOSE 111* 107* 106*  BUN 18 20 18   CREATININE 1.54* 1.70* 1.77*  CALCIUM  9.8  --  9.2   Liver Function Tests: Recent Labs  Lab  03/26/24 2146  AST 22  ALT 15  ALKPHOS 68  BILITOT 0.9  PROT 8.0  ALBUMIN 4.0    CBC: Recent Labs  Lab 03/26/24 2146 03/26/24 2155  WBC 9.3  --   NEUTROABS 6.7  --   HGB 13.7 15.0  HCT 41.2 44.0  MCV 87.5  --   PLT 325  --     IMAGING STUDIES ECHOCARDIOGRAM  COMPLETE Result Date: 03/27/2024    ECHOCARDIOGRAM REPORT   Patient Name:   Victoria Guzman Date of Exam: 03/27/2024 Medical Rec #:  [id number]         Height:       66.0 in Accession #:    [id number]        Weight:       175.0 lb Date of Birth:  02-Jun-1966         BSA:          1.890 m Patient Age:    58 years          BP:           138/89 mmHg Patient Gender: F                 HR:           82 bpm. Exam Location:  Inpatient Procedure: 2D Echo, Cardiac Doppler and Color Doppler (Both Spectral and Color            Flow Doppler were utilized during procedure). Indications:    Stoke  History:        Patient has prior history of Echocardiogram examinations, most                 recent 11/14/2022. Stroke; Risk Factors:Hypertension.  Sonographer:    Alicia Inoue Referring Phys: [id number] ALLISON L ELLIS IMPRESSIONS  1. Left ventricular ejection fraction, by estimation, is 60 to 65%. The left ventricle has normal function. The left ventricle has no regional wall motion abnormalities. There is mild concentric left ventricular hypertrophy. Left ventricular diastolic parameters are consistent with Grade I diastolic dysfunction (impaired relaxation).  2. Right ventricular systolic function is normal. The right ventricular size is normal. Tricuspid regurgitation signal is inadequate for assessing PA pressure.  3. The mitral valve is normal in structure. No evidence of mitral valve regurgitation. No evidence of mitral stenosis.  4. The aortic valve is normal in structure. Aortic valve regurgitation is trivial. No aortic stenosis is present. Aortic valve area, by VTI measures 2.13 cm. Aortic valve mean gradient measures 9.0 mmHg. Aortic valve Vmax measures 2.10 m/s.  5. The inferior vena cava is normal in size with greater than 50% respiratory variability, suggesting right atrial pressure of 3 mmHg. FINDINGS  Left Ventricle: Left ventricular ejection fraction, by estimation, is 60 to 65%. The left ventricle has normal function.  The left ventricle has no regional wall motion abnormalities. The left ventricular internal cavity size was normal in size. There is  mild concentric left ventricular hypertrophy. Left ventricular diastolic parameters are consistent with Grade I diastolic dysfunction (impaired relaxation). Right Ventricle: The right ventricular size is normal. No increase in right ventricular wall thickness. Right ventricular systolic function is normal. Tricuspid regurgitation signal is inadequate for assessing PA pressure. Left Atrium: Left atrial size was normal in size. Right Atrium: Right atrial size was normal in size. Pericardium: There is no evidence of pericardial effusion. Mitral Valve: The mitral valve is normal in structure. No evidence of mitral valve regurgitation. No evidence  of mitral valve stenosis. Tricuspid Valve: The tricuspid valve is normal in structure. Tricuspid valve regurgitation is trivial. No evidence of tricuspid stenosis. Aortic Valve: The aortic valve is normal in structure. Aortic valve regurgitation is trivial. No aortic stenosis is present. Aortic valve mean gradient measures 9.0 mmHg. Aortic valve peak gradient measures 17.6 mmHg. Aortic valve area, by VTI measures 2.13 cm. Pulmonic Valve: The pulmonic valve was normal in structure. Pulmonic valve regurgitation is not visualized. No evidence of pulmonic stenosis. Aorta: The aortic root is normal in size and structure. Venous: The inferior vena cava is normal in size with greater than 50% respiratory variability, suggesting right atrial pressure of 3 mmHg. IAS/Shunts: No atrial level shunt detected by color flow Doppler.  LEFT VENTRICLE PLAX 2D LVIDd:         3.20 cm     Diastology LVIDs:         2.10 cm     LV e' medial:    4.99 cm/s LV PW:         1.30 cm     LV E/e' medial:  14.7 LV IVS:        1.20 cm     LV e' lateral:   5.96 cm/s LVOT diam:     2.20 cm     LV E/e' lateral: 12.3 LV SV:         80 LV SV Index:   42 LVOT Area:     3.80 cm  LV  Volumes (MOD) LV vol d, MOD A2C: 64.0 ml LV vol d, MOD A4C: 52.8 ml LV vol s, MOD A2C: 26.2 ml LV vol s, MOD A4C: 14.9 ml LV SV MOD A2C:     37.8 ml LV SV MOD A4C:     52.8 ml LV SV MOD BP:      36.7 ml RIGHT VENTRICLE             IVC RV S prime:     13.10 cm/s  IVC diam: 1.90 cm TAPSE (M-mode): 1.9 cm LEFT ATRIUM             Index LA Vol (A2C):   34.2 ml 18.10 ml/m LA Vol (A4C):   45.6 ml 24.13 ml/m LA Biplane Vol: 41.4 ml 21.91 ml/m  AORTIC VALVE AV Area (Vmax):    2.14 cm AV Area (Vmean):   2.14 cm AV Area (VTI):     2.13 cm AV Vmax:           210.00 cm/s AV Vmean:          141.000 cm/s AV VTI:            0.376 m AV Peak Grad:      17.6 mmHg AV Mean Grad:      9.0 mmHg LVOT Vmax:         118.00 cm/s LVOT Vmean:        79.300 cm/s LVOT VTI:          0.211 m LVOT/AV VTI ratio: 0.56  AORTA Ao Sinus diam: 2.90 cm MITRAL VALVE MV Area (PHT): 4.31 cm     SHUNTS MV Decel Time: 176 msec     Systemic VTI:  0.21 m MV E velocity: 73.30 cm/s   Systemic Diam: 2.20 cm MV A velocity: 103.00 cm/s MV E/A ratio:  0.71 Gaylyn Keas MD Electronically signed by Gaylyn Keas MD Signature Date/Time: 03/27/2024/3:47:23 PM    Final    MR BRAIN WO CONTRAST Result Date:  03/27/2024 CLINICAL DATA:  Acute neurologic deficit EXAM: MRI HEAD WITHOUT CONTRAST TECHNIQUE: Multiplanar, multiecho pulse sequences of the brain and surrounding structures were obtained without intravenous contrast. COMPARISON:  None Available. FINDINGS: Brain: There is multifocal acute ischemia within the anterior left MCA territory and at the right basal ganglia. No acute or chronic hemorrhage. There is multifocal hyperintense T2-weighted signal within the white matter. Parenchymal volume and CSF spaces are normal. Anterior left temporal lobe encephalomalacia. The midline structures are normal. Vascular: Normal flow voids. Skull and upper cervical spine: Normal calvarium and skull base. Visualized upper cervical spine and soft tissues are normal.  Sinuses/Orbits:No paranasal sinus fluid levels or advanced mucosal thickening. No mastoid or middle ear effusion. Normal orbits. IMPRESSION: Multifocal acute ischemia within the anterior left MCA territory and at the right basal ganglia. No hemorrhage or mass effect. Electronically Signed   By: Juanetta Nordmann M.D.   On: 03/27/2024 02:56   CT Head Wo Contrast Result Date: 03/27/2024 CLINICAL DATA:  Stroke suspected EXAM: CT HEAD WITHOUT CONTRAST CT ANGIOGRAPHY OF THE HEAD AND NECK TECHNIQUE: Contiguous axial images were obtained from the base of the skull through the vertex without intravenous contrast. Multidetector CT imaging of the head and neck was performed using the standard protocol during bolus administration of intravenous contrast. Multiplanar CT image reconstructions and MIPs were obtained to evaluate the vascular anatomy. Carotid stenosis measurements (when applicable) are obtained utilizing NASCET criteria, using the distal internal carotid diameter as the denominator. RADIATION DOSE REDUCTION: This exam was performed according to the departmental dose-optimization program which includes automated exposure control, adjustment of the mA and/or kV according to patient size and/or use of iterative reconstruction technique. 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 No acute hemorrhage. No mass lesion. No hydrocephalus. Vascular: No hyperdense vessel or unexpected vascular calcification. Skull: The visualized skull base, calvarium and extracranial soft tissues are normal. Sinuses/Orbits: No fluid levels or advanced mucosal thickening of the visualized paranasal sinuses. No mastoid or middle ear effusion. Normal orbits. CTA NECK FINDINGS Skeleton: No acute abnormality or high grade bony spinal canal stenosis. Other neck: Normal pharynx, larynx and major salivary glands. No cervical lymphadenopathy.  Unremarkable thyroid gland. Upper chest: No pneumothorax or pleural effusion. No nodules or masses. Aortic arch: There is no calcific atherosclerosis of the aortic arch. Normal variant aortic arch branching pattern with the brachiocephalic and left common carotid arteries sharing a common origin. RIGHT carotid system: Normal without aneurysm, dissection or stenosis. LEFT carotid system: Normal without aneurysm, dissection or stenosis. Vertebral arteries: Codominant configuration. There is no dissection, occlusion or flow-limiting stenosis to the skull base (V1-V3 segments). CTA HEAD FINDINGS POSTERIOR CIRCULATION: Vertebral arteries are normal. No proximal occlusion of the anterior or inferior cerebellar arteries. Basilar artery is normal. Superior cerebellar arteries are normal. Posterior cerebral arteries are normal. ANTERIOR CIRCULATION: Intracranial internal carotid arteries are normal. Anterior cerebral arteries are normal. Progression of high-grade stenosis and short segment occlusion of the distal M1 segment of the left MCA. Good collateral flow distally. The right MCA is normal. The occluded portion of the left MCA measures approximately 8 mm in length. Venous sinuses: As permitted by contrast timing, patent. Anatomic variants: None Review of the MIP images confirms the above findings. IMPRESSION: 1. 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 Good collateral flow distally. 2. Old left basal ganglia  and insula infarct. Electronically Signed   By: Juanetta Nordmann M.D.   On: 03/27/2024 01:52   CT ANGIO HEAD NECK W WO CM Result Date: 03/27/2024 CLINICAL DATA:  Stroke suspected EXAM: CT HEAD WITHOUT CONTRAST CT ANGIOGRAPHY OF THE HEAD AND NECK TECHNIQUE: Contiguous axial images were obtained from the base of the skull through the vertex without intravenous contrast. Multidetector CT imaging of the head and neck was performed using the standard  protocol during bolus administration of intravenous contrast. Multiplanar CT image reconstructions and MIPs were obtained to evaluate the vascular anatomy. Carotid stenosis measurements (when applicable) are obtained utilizing NASCET criteria, using the distal internal carotid diameter as the denominator. RADIATION DOSE REDUCTION: This exam was performed according to the departmental dose-optimization program which includes automated exposure control, adjustment of the mA and/or kV according to patient size and/or use of iterative reconstruction technique. 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 No acute hemorrhage. No mass lesion. No hydrocephalus. Vascular: No hyperdense vessel or unexpected vascular calcification. Skull: The visualized skull base, calvarium and extracranial soft tissues are normal. Sinuses/Orbits: No fluid levels or advanced mucosal thickening of the visualized paranasal sinuses. No mastoid or middle ear effusion. Normal orbits. CTA NECK FINDINGS Skeleton: No acute abnormality or high grade bony spinal canal stenosis. Other neck: Normal pharynx, larynx and major salivary glands. No cervical lymphadenopathy. Unremarkable thyroid gland. Upper chest: No pneumothorax or pleural effusion. No nodules or masses. Aortic arch: There is no calcific atherosclerosis of the aortic arch. Normal variant aortic arch branching pattern with the brachiocephalic and left common carotid arteries sharing a common origin. RIGHT carotid system: Normal without aneurysm, dissection or stenosis. LEFT carotid system: Normal without aneurysm, dissection or stenosis. Vertebral arteries: Codominant configuration. There is no dissection, occlusion or flow-limiting stenosis to the skull base (V1-V3 segments). CTA HEAD FINDINGS POSTERIOR CIRCULATION: Vertebral arteries are normal. No proximal occlusion of the anterior or  inferior cerebellar arteries. Basilar artery is normal. Superior cerebellar arteries are normal. Posterior cerebral arteries are normal. ANTERIOR CIRCULATION: Intracranial internal carotid arteries are normal. Anterior cerebral arteries are normal. Progression of high-grade stenosis and short segment occlusion of the distal M1 segment of the left MCA. Good collateral flow distally. The right MCA is normal. The occluded portion of the left MCA measures approximately 8 mm in length. Venous sinuses: As permitted by contrast timing, patent. Anatomic variants: None Review of the MIP images confirms the above findings. IMPRESSION: 1. 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 Good collateral flow distally. 2. 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 Electronically Signed   By: Juanetta Nordmann M.D.   On: 03/27/2024 01:52    DISCHARGE EXAMINATION: Vitals:   03/28/24 0500 03/28/24 0915 03/28/24 0918 03/28/24 0920  BP:  114/78 114/78   Pulse: 83 86 (!) 103   Resp: 15 (!) 22 20   Temp:    98 F (36.7 C)  TempSrc:    Oral  SpO2: 96% 96% 97%   Weight:      Height:       General appearance: Awake alert.  In no distress Resp: Clear to auscultation bilaterally.  Normal effort Cardio: S1-S2 is normal regular.  No S3-S4.  No rubs murmurs or bruit GI: Abdomen is soft.  Nontender nondistended.  Bowel sounds are present normal.  No masses organomegaly Extremities: No edema.  Full range of motion of lower extremities. Neurologic: Alert and oriented x3.  No focal neurological deficits.    DISPOSITION: Home  Discharge Instructions     Ambulatory referral to Neurology   Complete by: As directed    An appointment is requested in approximately: 4 weeks for acute stroke   Call MD for:  difficulty breathing, headache or visual disturbances   Complete by: As directed    Call MD for:  extreme fatigue   Complete by: As directed    Call MD for:   persistant dizziness or light-headedness   Complete by: As directed    Call MD for:  persistant nausea and vomiting   Complete by: As directed    Call MD for:  severe uncontrolled pain   Complete by: As directed    Call MD for:  temperature >100.4   Complete by: As directed    Diet - low sodium heart healthy   Complete by: As directed    Discharge instructions   Complete by: As directed    Please be sure to take your medications as prescribed.  Please be sure to follow-up with your primary care provider.  A referral has been sent to neurology for outpatient follow-up.  You were cared for by a hospitalist during your hospital stay. If you have any questions about your discharge medications or the care you received while you were in the hospital after you are discharged, you can call the unit and asked to speak with the hospitalist on call if the hospitalist that took care of you is not available. Once you are discharged, your primary care physician will handle any further medical issues. Please note that NO REFILLS for any discharge medications will be authorized once you are discharged, as it is imperative that you return to your primary care physician (or establish a relationship with a primary care physician if you do not have one) for your aftercare needs so that they can reassess your need for medications and monitor your lab values. If you do not have a primary care physician, you can call [phone] for a physician referral.   Increase activity slowly   Complete by: As directed          Allergies as of 03/28/2024       Reactions   Iodinated Contrast Media Cough   Mild hypoxia, wheezing, throat edema feeling shortly after contrast 03/27/24. Improved without treatment, patient doesn't think it was an allergic reaction and refused all meds. Did improve without them.    Clonidine  Derivatives Hives   Quinolones Hives, Swelling   Trazodone Other (See Comments)   Caused sedation. Felt  depressed while on medication.   Zofran  [ondansetron  Hcl] Itching, Swelling, Other (See Comments)   swelling of lips        Medication List     TAKE these medications    acetaminophen  650 MG CR tablet Commonly known as: TYLENOL  Take 1,300 mg by mouth every 8 (eight) hours as needed for pain.   albuterol  108 (90 Base) MCG/ACT inhaler Commonly known as: ProAir  HFA Inhale 2 puffs into the lungs every 4 (four) hours as needed for wheezing or shortness of breath.   aspirin  EC 81 MG tablet Take 1 tablet (81 mg total) by mouth daily. Swallow whole.   atorvastatin  40 MG tablet Commonly known as: LIPITOR Take 1 tablet (40 mg total) by mouth daily. Start taking on: March 29, 2024   clonazePAM  1 MG tablet Commonly known as: KLONOPIN   Take 1 mg by mouth at bedtime.   clopidogrel  75 MG tablet Commonly known as: PLAVIX  Take 1 tablet (75 mg total) by mouth daily. For 3 months Start taking on: March 29, 2024   lisinopril -hydrochlorothiazide  20-12.5 MG tablet Commonly known as: ZESTORETIC  Take 1 tablet by mouth 2 (two) times daily.   oxyCODONE -acetaminophen  10-325 MG tablet Commonly known as: PERCOCET Take 1 tablet by mouth every 8 (eight) hours as needed for pain.   promethazine  25 MG tablet Commonly known as: PHENERGAN  Take 25 mg by mouth every 8 (eight) hours as needed for nausea or vomiting.   tiZANidine  4 MG tablet Commonly known as: ZANAFLEX  Take 4 mg by mouth at bedtime.   zolpidem  10 MG tablet Commonly known as: AMBIEN  Take 10 mg by mouth at bedtime.          Follow-up Information     Little Riff, MD. Schedule an appointment as soon as possible for a visit in 1 week(s).   Specialty: Internal Medicine Why: post hospitalization follow up Contact information: [address] [facility] [place] [address] [phone]                 TOTAL DISCHARGE TIME: 35 minutes  Victoria Guzman  [facility] Pager on  [email or web address]  03/28/2024, 11:47 AM

## 2024-03-28 NOTE — Telephone Encounter (Signed)
 Royce Sciara J. Rachel Budds, RN, BSN, [place] [phone] RNCM spoke with pt via regarding discharge planning for Home Health Services. Offered pt [email or web address] list of home health agencies to choose from.  Pt chose [facility] to render PT and SLP services. Thurston Flow  of [facility] notified. Patient made aware that [facility] will be in contact in 24-48 hours.  No DME needs identified at this time.

## 2024-03-28 NOTE — ED Notes (Signed)
 RN assumed care of pt, found her resting comfortably in bed, observed the slow and steady rise and fall of chest.  No distress noted. Family bedside was awake, denies any needs at this time.

## 2024-03-28 NOTE — Care Management Obs Status (Signed)
 MEDICARE OBSERVATION STATUS NOTIFICATION   Patient Details  Name: Victoria Guzman MRN: [id number] Date of Birth: Nov 11, 1966   Medicare Observation Status Notification Given:  Yes    Joanette Moynahan, RN 03/28/2024, 7:59 AM

## 2024-03-28 NOTE — Evaluation (Signed)
 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) 11/13/2022   History of CVA (cerebrovascular accident) 11/13/2022   Elevated troponin 11/13/2022   Acute respiratory failure with hypoxemia (HCC) 11/13/2022   Post-operative state 01/25/2019   ARF (acute renal failure) (HCC) 04/24/2018   Acute renal failure (ARF) (HCC) 04/22/2018   Hypercalcemia 05/31/2016   Hypokalemia 05/31/2016   Hypochloremia 05/31/2016   Screening for HIV (human immunodeficiency virus) 05/31/2016   Screen for STD (sexually transmitted disease) 05/31/2016   Vitamin D  deficiency 05/31/2016   Chronic insomnia 05/31/2016   Absolute anemia 07/07/2015   Anxiety and depression 07/07/2015   Essential hypertension 07/07/2015   Acute anxiety 07/07/2015   Primary fibromyalgia syndrome 07/07/2015   Embolic stroke (HCC) 07/17/2014   Past Medical History:  Past Medical History:  Diagnosis Date   Anemia    Anxiety    Chronic fatigue    Chronic insomnia 05/31/2016   Depression    Family history of adverse reaction to anesthesia    mother difficult to wake up after CABG   Fibromyalgia    Hypertension    Insomnia    Panic attack    Stroke (HCC)    tia   Vitamin D  deficiency 05/31/2016   Past Surgical History:  Past Surgical History:  Procedure Laterality Date   ABDOMINAL HYSTERECTOMY     COLONOSCOPY WITH PROPOFOL  N/A 07/24/2018   Procedure: COLONOSCOPY WITH PROPOFOL ;  Surgeon: Toledo, Alphonsus Jeans, MD;  Location: [facility];  Service: Gastroenterology;  Laterality: N/A;   ESOPHAGOGASTRODUODENOSCOPY (EGD) WITH PROPOFOL  N/A 07/24/2018    Procedure: ESOPHAGOGASTRODUODENOSCOPY (EGD) WITH PROPOFOL ;  Surgeon: Toledo, Alphonsus Jeans, MD;  Location: [facility];  Service: Gastroenterology;  Laterality: N/A;   LAPAROSCOPIC BILATERAL SALPINGECTOMY Bilateral 01/25/2019   Procedure: LAPAROSCOPIC BILATERAL SALPINGECTOMY;  Surgeon: Schermerhorn, Joselyn Nicely, MD;  Location: [facility];  Service: Gynecology;  Laterality: Bilateral;   NASAL TURBINATE REDUCTION Bilateral 06/14/2022   Procedure: INFERIOR TURBINATE REDUCTION;  Surgeon: Mellody Sprout, MD;  Location: [facility];  Service: ENT;  Laterality: Bilateral;   PARATHYROIDECTOMY     PARTIAL HYSTERECTOMY     SEPTOPLASTY N/A 06/14/2022   Procedure: SEPTOPLASTY;  Surgeon: Mellody Sprout, MD;  Location: [facility];  Service: ENT;  Laterality: N/A;   TRACHELECTOMY N/A 01/25/2019   Procedure: TRACHELECTOMY, laparoscopic cervical;  Surgeon: Carolynn Citrin, MD;  Location: [facility];  Service: Gynecology;  Laterality: N/A;   HPI:  Victoria Guzman is a 58 y.o. female who presented with stroke symptoms.  Patient was recently vacationing in [place] when she developed strokelike symptoms.  Unfortunately she was unable to pay for her health care and stroke care treatment in [place] therefore opted to return home to [facility] for care.  She developed symptoms around noon on 03/25/24 while in [place]. She continues to have right facial droop, self-report of right side numbness and tingling and right-sided weakness of upper and lower extremities.  MRI 4/23: "Multifocal acute ischemia within the anterior left MCA territory and at the right basal ganglia." Pt with medical history significant of stroke 2015, fibromyalgia, anemia, hypertension.   Assessment / Plan / Recommendation Clinical Impression  Pt  presents with mild cognitive linguistic impairments.  Pt was assessed using the COGNISTAT (see below for addition information) and demonstrated impairments in attention, registration, command following,  and calculations.  Pt's visual construction ability was assessed using a clock drawing task which pt completed with her nondominant hand. Despite limitations using LUE clock was well organized with accurate number placment.  Hands did not originate at center of clock, but length was appropriate and they pointed to the correct numbers.  Pt had difficulty with digit span task, and showed evidence of attention deficits in command following, and calculations as well.  Pt benefited from commands broken into single steps. Pt had difficulty with registration for word recall task, but with delayed recall performed very, very well.  Attention may be underlying factor in all areas of deficit.    Pt is complaining of changes to communication, specifically with getting words out.  Her spontaneous speech is fluent and grammatical, but there does appear to be some difficulty with word finding. Semantic ("salad" for "carrot") and phonemic ("shoehorn, for "horseshoe") observed.  Pt is able to circumlocute and benefits for semantic and at times phonemic cuing.  Word finding difficulties did interfere with similarties task (where target words like "transportation" and "measurement" are required answers), but she was able to express concepts well enough to score within average range.    Pt's speech is marked by infrequent articulatory imprecision.  Errors were corrected with repetition and were inconsistent across words with exception of some final s omission.  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    Pt would benefit from ST at next level of care. If pt to d/c home, home health ST is preferred over OP.  If pt requires rehab stay after d/c consiter IPR as pt is an excellent speech therapy candidate.  Daughter reports pt will be staying with her if she discharge  plans are for home so she will have supervision if needed 2/2 attention deficits.   COGNISTAT: All subtests are within the average range, except where otherwise specified.  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  Recommendations may be updated based on patient status, additional functional criteria and insurance authorization.    Follow Up Recommendations  Acute inpatient rehab (3hours/day) (v Home health depending on PT/OT recs)    Assistance Recommended at Discharge   N/A  Functional Status Assessment Patient has had a recent decline in their functional status and demonstrates the ability to make significant improvements in function in a reasonable and predictable amount of time.  Frequency and Duration min 2x/week  2 weeks      SLP Evaluation Cognition  Overall Cognitive Status: Impaired/Different from baseline Arousal/Alertness: Awake/alert Orientation Level: Oriented X4 Year: 2025 Month: April Day of Week: Correct Attention: Focused;Sustained Focused Attention: Impaired Focused Attention Impairment: Verbal basic Sustained Attention: Impaired Sustained Attention Impairment: Verbal basic Memory: Appears intact Awareness: Appears intact Problem  Solving: Appears intact Executive Function: Reasoning;Organizing Reasoning: Appears intact Organizing: Appears intact       Comprehension   Auditory Comprehension Overall Auditory Comprehension: Impaired Commands: Impaired One Step Basic Commands: 75-100% accurate Two Step Basic Commands: 50-74% accurate Multistep Basic Commands: 0-24% accurate Conversation: Complex Interfering Components: Attention EffectiveTechniques: Repetition Visual Recognition/Discrimination Discrimination: Not tested Reading Comprehension Reading Status: Not tested    Expression Expression Primary Mode of Expression: Verbal Verbal Expression Overall Verbal Expression: Impaired Initiation: No impairment Level of Generative/Spontaneous Verbalization: Conversation Repetition: No impairment Naming: Impairment Verbal Errors: Semantic paraphasias;Phonemic paraphasias;Aware of errors Pragmatics: No impairment Interfering Components: Attention Written Expression Dominant Hand: Right Written Expression: Not tested   Oral / Motor  Oral Motor/Sensory Function Overall Oral Motor/Sensory Function: Mild impairment Facial ROM: Reduced right Facial Symmetry: Abnormal symmetry right Lingual ROM: Reduced right;Reduced left (moving mandible with tongue) Lingual Symmetry: Abnormal symmetry right Velum: Within Functional Limits Mandible: Within Functional Limits            Eldra Word E Nilo Fallin 03/28/2024, 12:01 PM
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
## 2024-04-18 ENCOUNTER — Telehealth: Payer: Self-pay | Admitting: Neurology

## 2024-04-18 NOTE — Telephone Encounter (Signed)
 FYI she is scheduled to see Dr. Janett Medin on 06/11/24

## 2024-04-18 NOTE — Telephone Encounter (Signed)
 Patient called in requesting a sooner follow up with Dr. Janett Medin. I advised I have nothing available sooner right now for an appointment and she specifically only wants to see Dr. Janett Medin who saw her in the hospital. I let her know I have her on the wait list. Patient began to get very frustrated and repeated several times that everyone has told her to schedule an appointment in a month to see neurology. I said yes that is normal for a stroke follow up, but unfortunately we do not have the availability to do that as we see many patients here and our doctors schedules are completely full. I was reviewing her chart and saw she actually just saw Dr. Mason Sole at [facility] last week for this same exact thing. I asked why she is wanting to come here if she just saw them about this. She stated that doctor did nothing for her and it was a 5 minute appointment and she wants "someone to sit down with her and explain what's wrong with her". She continued to repeat that she doesn't understand why she can't get an appointment with us  in a month when she is a first time patient here and had a stroke. I advised patient that I can definitely send a message to Dr. Janett Medin and advise of everything and get his advice, but since she saw Dr. Mason Sole this is technically a second opinion now and we have many patients waiting that have not been able to see a neurologist at all.  Please advise, I'm not sure of what else to tell this patient, I'm not even sure if you will still agree to see her since she just saw another neurologist, she saw them the same day she called to schedule an appointment here-was not aware of this at the time.

## 2024-04-23 ENCOUNTER — Other Ambulatory Visit (HOSPITAL_COMMUNITY): Payer: Self-pay

## 2024-06-11 ENCOUNTER — Ambulatory Visit: Admitting: Neurology

## 2024-06-11 ENCOUNTER — Encounter: Payer: Self-pay | Admitting: Neurology

## 2024-06-11 VITALS — BP 132/90 | HR 74 | Ht 66.0 in | Wt 184.8 lb

## 2024-06-11 DIAGNOSIS — R29898 Other symptoms and signs involving the musculoskeletal system: Secondary | ICD-10-CM

## 2024-06-11 DIAGNOSIS — Z9189 Other specified personal risk factors, not elsewhere classified: Secondary | ICD-10-CM

## 2024-06-11 DIAGNOSIS — I639 Cerebral infarction, unspecified: Secondary | ICD-10-CM

## 2024-06-11 DIAGNOSIS — I6602 Occlusion and stenosis of left middle cerebral artery: Secondary | ICD-10-CM

## 2024-06-11 NOTE — Progress Notes (Signed)
 [facility] [address] [place]. [place] [address] [phone]       OFFICE FOLLOW-UP NOTE  Victoria Guzman Date of Birth:  1966-09-25 Medical Record Number:  [id number]   HPI: [name] is a 58 year old African-American lady seen today for initial office follow-up visit following hospital consultation for stroke in April 2025.  She is accompanied by her daughter.  History is obtained from them and review of electronic medical records.  I personally reviewed pertinent available imaging films in PACS.  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  She presented on 03/27/2024 for evaluation for slurred speech and expressive aphasia.  Symptoms began 2 days prior to the ER visit while she was on vacation in [place].  She was seen in the hospital there and diagnosed with ischemic stroke but she was unable to afford the treatment so she left [place] and [place] the [place]  and came straight to the hospital for evaluation.  CT angiogram of the head and neck showed progression of previously known short segment left MCA stenosis in 2015.  MRI demonstrated multifocal acute infarcts in left MCA distribution as well as the right basal ganglia.  Echocardiogram showed ejection fraction of 6065%.  Left atrial size was normal.  LDL cholesterol 72 mg percent.  Hemoglobin A1c was 5.2.  Patient was started on dual antiplatelet therapy of aspirin  and Plavix  which she says she is tolerating well but does have easy bruising but no bleeding complications.  She still has some residual hand weakness on the right with diminished fine motor skills.  She has trouble with activities like cutting food or cleaning her but.  She occasionally has word finding difficulties particularly when she is tries to talk too fast or gets tired.  Overall she feels her speech is much improved.  She has had no further recurrent stroke or  TIA symptoms.  She has been quite compliant with her medications.  She was advised a 30-day heart monitor but that has not been done yet. ROS:   14 system review of systems is positive for hand weakness frustration, word finding difficulty, speech difficulty, bruising all other systems negative  PMH:  Past Medical History:  Diagnosis Date   Anemia    Anxiety    Chronic fatigue    Chronic insomnia 05/31/2016   Depression    Family history of adverse reaction to anesthesia    mother difficult to wake up after CABG   Fibromyalgia    Hypertension    Insomnia    Panic attack    Stroke [facility]    tia   Vitamin D  deficiency 05/31/2016    Social History:  Social History   Socioeconomic History   Marital status: Divorced    Spouse name: Not on file   Number of children: Not on file   Years of education: Not on file   Highest education level: Not on file  Occupational History   Not on file  Tobacco Use   Smoking status: Never   Smokeless tobacco: Never  Vaping Use   Vaping status: Never Used  Substance and Sexual Activity   Alcohol use: Yes    Alcohol/week: 1.0 standard drink of alcohol    Types: 1 Glasses of wine per week    Comment: glass wine/day   Drug use: No   Sexual activity: Not Currently  Other Topics Concern   Not on file  Social History Narrative   Pt was Academic librarian - currently on disability since 2006   Social Drivers of Health   Financial Resource Strain: Low Risk  (05/16/2024)   Received from [facility]   Overall Financial Resource Strain (CARDIA)    Difficulty of Paying Living Expenses: Not hard at all  Recent Concern: Financial Resource Strain - Medium Risk (04/11/2024)   Received from [facility]   Overall Financial Resource Strain (CARDIA)    Difficulty of Paying Living Expenses: Somewhat hard  Food Insecurity: No Food Insecurity (05/16/2024)   Received from [facility]   Hunger Vital Sign     Within the past 12 months, you worried that your food would run out before you got the money to buy more.: Never true    Within the past 12 months, the food you bought just didn't last and you didn't have money to get more.: Never true  Recent Concern: Food Insecurity - Food Insecurity Present (04/11/2024)   Received from [facility]   Hunger Vital Sign    Worried About Running Out of Food in the Last Year: Sometimes true    Ran Out of Food in the Last Year: Sometimes true  Transportation Needs: No Transportation Needs (05/16/2024)   Received from [facility] - Transportation    In the past 12 months, has lack of transportation kept you from medical appointments or from getting medications?: No    Lack of Transportation (Non-Medical): No  Physical Activity: Inactive (10/17/2023)   Received from [facility]   Exercise Vital Sign    On average, how many days per week do you engage in moderate to strenuous exercise (like a brisk walk)?: 0 days    On average, how many minutes do you engage in exercise at this level?: 0 min  Stress: Stress Concern Present (10/17/2023)   Received from [facility] - Occupational Stress Questionnaire    Feeling of Stress : Very much  Social Connections: Socially Isolated (10/17/2023)   Received from [facility]   Social Connection and Isolation Panel    In a typical week, how many times do you talk on the phone with family, friends, or neighbors?: Once a week    How often do you get together with friends or relatives?: Never    How often do you attend church or religious services?: Never    Do you belong to any clubs or organizations such as church groups, unions, fraternal or athletic groups, or school groups?: Yes    How often do you attend meetings of the clubs or organizations you belong to?: Never    Are you  married, widowed, divorced, separated, never married, or living with a partner?: Divorced  Intimate Partner Violence: Unknown (11/01/2022)   Received from [facility]   HITS    Physically Hurt: Not on file    Insult or Talk Down To: Not on file    Threaten Physical Harm: Not on file    Scream or Curse: Not on file    Medications:   Current Outpatient Medications on File Prior to Visit  Medication Sig Dispense Refill   acetaminophen  (TYLENOL ) 650 MG CR tablet Take 1,300 mg by mouth every 8 (eight) hours as needed for pain.     albuterol  (PROAIR  HFA) 108 (90 Base) MCG/ACT inhaler Inhale 2 puffs into the  lungs every 4 (four) hours as needed for wheezing or shortness of breath. 1 each 2   aspirin  EC 81 MG tablet Take 1 tablet (81 mg total) by mouth daily. Swallow whole. 30 tablet 11   atorvastatin  (LIPITOR) 40 MG tablet Take 1 tablet (40 mg total) by mouth daily. 90 tablet 0   clonazePAM  (KLONOPIN ) 1 MG tablet Take 1 mg by mouth at bedtime.     clopidogrel  (PLAVIX ) 75 MG tablet Take 1 tablet (75 mg total) by mouth daily. For 3 months 90 tablet 0   lisinopril -hydrochlorothiazide  (PRINZIDE ,ZESTORETIC ) 20-12.5 MG tablet Take 1 tablet by mouth 2 (two) times daily.  1   oxyCODONE -acetaminophen  (PERCOCET) 10-325 MG tablet Take 1 tablet by mouth every 8 (eight) hours as needed for pain.     promethazine  (PHENERGAN ) 25 MG tablet Take 25 mg by mouth every 8 (eight) hours as needed for nausea or vomiting.     tiZANidine  (ZANAFLEX ) 4 MG tablet Take 4 mg by mouth at bedtime.     zolpidem  (AMBIEN ) 10 MG tablet Take 10 mg by mouth at bedtime.  1   No current facility-administered medications on file prior to visit.    Allergies:   Allergies  Allergen Reactions   Iodinated Contrast Media Cough    Mild hypoxia, wheezing, throat edema feeling shortly after contrast 03/27/24. Improved without treatment, patient doesn't think it was an allergic reaction and refused all meds. Did improve without them.     Clonidine  Derivatives Hives   Quinolones Hives and Swelling   Trazodone Other (See Comments)    Caused sedation. Felt depressed while on medication.   Zofran  [Ondansetron  Hcl] Itching, Swelling and Other (See Comments)    swelling of lips    Physical Exam General: Mildly overweight middle-aged African-American lady, seated, in no evident distress Head: head normocephalic and atraumatic.  Neck: supple with no carotid or supraclavicular bruits Cardiovascular: regular rate and rhythm, no murmurs Musculoskeletal: no deformity Skin:  no rash/petichiae Vascular:  Normal pulses all extremities Vitals:   06/11/24 0838  BP: (!) 132/90  Pulse: 74   Neurologic Exam Mental Status: Awake and fully alert. Oriented to place and time. Recent and remote memory intact. Attention span, concentration and fund of knowledge appropriate. Mood and affect appropriate.  Cranial Nerves: Fundoscopic exam reveals sharp disc margins. Pupils equal, briskly reactive to light. Extraocular movements full without nystagmus. Visual fields full to confrontation. Hearing intact. Facial sensation intact. Face, tongue, palate moves normally and symmetrically.  Motor: Normal bulk and tone. Normal strength in all tested extremity muscles.  Weakness of right grip and intrinsic hand muscles.  Diminished fine finger movements on the right.  Orbits left or right upper extremity. Sensory.: intact to touch ,pinprick .position and vibratory sensation.  Coordination: Rapid alternating movements normal in all extremities. Finger-to-nose and heel-to-shin performed accurately bilaterally. Gait and Station: Arises from chair without difficulty. Stance is normal. Gait demonstrates normal stride length and balance . Able to heel, toe and tandem walk with slight difficulty.  Reflexes: 1+ and symmetric. Toes downgoing.   NIHSS  0 Modified Rankin  2   ASSESSMENT: 58 year old African-American lady with left MCA and right basal ganglia  infarcts in April 2025 likely from cryptogenic etiology.  Remote history of embolic left parietal infarct in 2015 with no residual deficits.  Vascular risk factors of hyperlipidemia, hypertension , prior stroke and intracranial stenosis.  She is doing reasonably well but still has some residual right hand weakness and diminished fine motor skills.  PLAN:I had a long d/w patient and her daughter about her recent stroke, hand weakness, intracranial stenosis, risk for recurrent stroke/TIAs, personally independently reviewed imaging studies and stroke evaluation results and answered questions.Continue aspirin  81 mg daily and clopidogrel  75 mg daily  for 2 more weeks and then aspirin  alone for secondary stroke prevention and maintain strict control of hypertension with blood pressure goal below 130/90, diabetes with hemoglobin A1c goal below 6.5% and lipids with LDL cholesterol goal below 70 mg/dL. I also advised the patient to eat a healthy diet with plenty of whole grains, cereals, fruits and vegetables, exercise regularly and maintain ideal body weight Continue ongoing occupational therapy and/or daily hand exercises for fine motor skills..Check polysomnogram for sleep apnea and 30-day heart monitor for paroxysmal A-fib.  Patient was advised to consider possible participation in the CAPTIVA stroke prevention study but declined.  Followup in the future with my nurse practitioner in 6 months or call earlier if necessary.  Greater than 50% of time during this 40 minute visit was spent on counseling,explanation of diagnosis, planning of further management, discussion with patient and family and coordination of care Eather Popp, MD Note: This document was prepared with digital dictation and possible smart phrase technology. Any transcriptional errors that result from this process are unintentional

## 2024-06-11 NOTE — Patient Instructions (Signed)
 I had a long d/w patient and her daughter about her recent stroke, hand weakness, intracranial stenosis, risk for recurrent stroke/TIAs, personally independently reviewed imaging studies and stroke evaluation results and answered questions.Continue aspirin  81 mg daily and clopidogrel  75 mg daily  for 2 more weakness and then aspirin  alone for secondary stroke prevention and maintain strict control of hypertension with blood pressure goal below 130/90, diabetes with hemoglobin A1c goal below 6.5% and lipids with LDL cholesterol goal below 70 mg/dL. I also advised the patient to eat a healthy diet with plenty of whole grains, cereals, fruits and vegetables, exercise regularly and maintain ideal body weight Continue ongoing occupational therapy and/or daily hand exercises for fine motor skills..Check polysomnogram for sleep apnea and 30-day heart monitor for paroxysmal A-fib.  Followup in the future with my nurse practitioner in 6 months or call earlier if necessary.  Stroke Prevention Some medical conditions and behaviors can lead to a higher chance of having a stroke. You can help prevent a stroke by eating healthy, exercising, not smoking, and managing any medical conditions you have. Stroke is a leading cause of functional impairment. Primary prevention is particularly important because a majority of strokes are first-time events. Stroke changes the lives of not only those who experience a stroke but also their family and other caregivers. How can this condition affect me? A stroke is a medical emergency and should be treated right away. A stroke can lead to brain damage and can sometimes be life-threatening. If a person gets medical treatment right away, there is a better chance of surviving and recovering from a stroke. What can increase my risk? The following medical conditions may increase your risk of a stroke: Cardiovascular disease. High blood pressure (hypertension). Diabetes. High  cholesterol. Sickle cell disease. Blood clotting disorders (hypercoagulable state). Obesity. Sleep disorders (obstructive sleep apnea). Other risk factors include: Being older than age 53. Having a history of blood clots, stroke, or mini-stroke (transient ischemic attack, TIA). Genetic factors, such as race, ethnicity, or a family history of stroke. Smoking cigarettes or using other tobacco products. Taking birth control pills, especially if you also use tobacco. Heavy use of alcohol or drugs, especially cocaine and methamphetamine. Physical inactivity. What actions can I take to prevent this? Manage your health conditions High cholesterol levels. Eating a healthy diet is important for preventing high cholesterol. If cholesterol cannot be managed through diet alone, you may need to take medicines. Take any prescribed medicines to control your cholesterol as told by your health care provider. Hypertension. To reduce your risk of stroke, try to keep your blood pressure below 130/80. Eating a healthy diet and exercising regularly are important for controlling blood pressure. If these steps are not enough to manage your blood pressure, you may need to take medicines. Take any prescribed medicines to control hypertension as told by your health care provider. Ask your health care provider if you should monitor your blood pressure at home. Have your blood pressure checked every year, even if your blood pressure is normal. Blood pressure increases with age and some medical conditions. Diabetes. Eating a healthy diet and exercising regularly are important parts of managing your blood sugar (glucose). If your blood sugar cannot be managed through diet and exercise, you may need to take medicines. Take any prescribed medicines to control your diabetes as told by your health care provider. Get evaluated for obstructive sleep apnea. Talk to your health care provider about getting a sleep evaluation if  you snore a  lot or have excessive sleepiness. Make sure that any other medical conditions you have, such as atrial fibrillation or atherosclerosis, are managed. Nutrition Follow instructions from your health care provider about what to eat or drink to help manage your health condition. These instructions may include: Reducing your daily calorie intake. Limiting how much salt (sodium) you use to 1,500 milligrams (mg) each day. Using only healthy fats for cooking, such as olive oil, canola oil, or sunflower oil. Eating healthy foods. You can do this by: Choosing foods that are high in fiber, such as whole grains, and fresh fruits and vegetables. Eating at least 5 servings of fruits and vegetables a day. Try to fill one-half of your plate with fruits and vegetables at each meal. Choosing lean protein foods, such as lean cuts of meat, poultry without skin, fish, tofu, beans, and nuts. Eating low-fat dairy products. Avoiding foods that are high in sodium. This can help lower blood pressure. Avoiding foods that have saturated fat, trans fat, and cholesterol. This can help prevent high cholesterol. Avoiding processed and prepared foods. Counting your daily carbohydrate intake.  Lifestyle If you drink alcohol: Limit how much you have to: 0-1 drink a day for women who are not pregnant. 0-2 drinks a day for men. Know how much alcohol is in your drink. In the [place] one drink equals one 12 oz bottle of beer ( ), one 5 oz glass of wine ( ), or one 1 oz glass of hard liquor (44mL). Do not use any products that contain nicotine or tobacco. These products include cigarettes, chewing tobacco, and vaping devices, such as e-cigarettes. If you need help quitting, ask your health care provider. Avoid secondhand smoke. Do not use drugs. Activity  Try to stay at a healthy weight. Get at least 30 minutes of exercise on most days, such as: Fast walking. Biking. Swimming. Medicines Take  over-the-counter and prescription medicines only as told by your health care provider. Aspirin  or blood thinners (antiplatelets or anticoagulants) may be recommended to reduce your risk of forming blood clots that can lead to stroke. Avoid taking birth control pills. Talk to your health care provider about the risks of taking birth control pills if: You are over 18 years old. You smoke. You get very bad headaches. You have had a blood clot. Where to find more information American Stroke Association: www.strokeassociation.org Get help right away if: You or a loved one has any symptoms of a stroke. BE FAST is an easy way to remember the main warning signs of a stroke: B - Balance. Signs are dizziness, sudden trouble walking, or loss of balance. E - Eyes. Signs are trouble seeing or a sudden change in vision. F - Face. Signs are sudden weakness or numbness of the face, or the face or eyelid drooping on one side. A - Arms. Signs are weakness or numbness in an arm. This happens suddenly and usually on one side of the body. S - Speech. Signs are sudden trouble speaking, slurred speech, or trouble understanding what people say. T - Time. Time to call emergency services. Write down what time symptoms started. You or a loved one has other signs of a stroke, such as: A sudden, severe headache with no known cause. Nausea or vomiting. Seizure. These symptoms may represent a serious problem that is an emergency. Do not wait to see if the symptoms will go away. Get medical help right away. Call your local emergency services (911 in the [place] Do not drive yourself to  the hospital. Summary You can help to prevent a stroke by eating healthy, exercising, not smoking, limiting alcohol intake, and managing any medical conditions you may have. Do not use any products that contain nicotine or tobacco. These include cigarettes, chewing tobacco, and vaping devices, such as e-cigarettes. If you need help quitting,  ask your health care provider. Remember BE FAST for warning signs of a stroke. Get help right away if you or a loved one has any of these signs. This information is not intended to replace advice given to you by your health care provider. Make sure you discuss any questions you have with your health care provider. Document Revised: 10/24/2022 Document Reviewed: 10/24/2022 Elsevier Patient Education  2024 [employer].

## 2024-09-02 NOTE — Therapy (Signed)
 OUTPATIENT OCCUPATIONAL THERAPY NEURO EVALUATION  Patient Name: NEEYA PRIGMORE MRN: [id number] DOB:04/19/1966, 58 y.o., female Today's Date: 09/03/2024  PCP: Rudolpho Norleen BIRCH, MD REFERRING PROVIDER: Rudolpho Norleen BIRCH, MD  END OF SESSION:  OT End of Session - 09/03/24 1656     Visit Number 1    Number of Visits 8    Date for Recertification  11/03/24    Authorization Type [employer] MCR    OT Start Time 1448    OT Stop Time 1535    OT Time Calculation (min) 47 min    Activity Tolerance Patient tolerated treatment well    Behavior During Therapy Anxious;WFL for tasks assessed/performed          Past Medical History:  Diagnosis Date   Anemia    Anxiety    Chronic fatigue    Chronic insomnia 05/31/2016   Depression    Family history of adverse reaction to anesthesia    mother difficult to wake up after CABG   Fibromyalgia    Hypertension    Insomnia    Panic attack    Stroke (HCC)    tia   Vitamin D  deficiency 05/31/2016   Past Surgical History:  Procedure Laterality Date   ABDOMINAL HYSTERECTOMY     COLONOSCOPY WITH PROPOFOL  N/A 07/24/2018   Procedure: COLONOSCOPY WITH PROPOFOL ;  Surgeon: Toledo, Ladell POUR, MD;  Location: [facility];  Service: Gastroenterology;  Laterality: N/A;   ESOPHAGOGASTRODUODENOSCOPY (EGD) WITH PROPOFOL  N/A 07/24/2018   Procedure: ESOPHAGOGASTRODUODENOSCOPY (EGD) WITH PROPOFOL ;  Surgeon: Toledo, Ladell POUR, MD;  Location: [facility];  Service: Gastroenterology;  Laterality: N/A;   LAPAROSCOPIC BILATERAL SALPINGECTOMY Bilateral 01/25/2019   Procedure: LAPAROSCOPIC BILATERAL SALPINGECTOMY;  Surgeon: Schermerhorn, Debby PARAS, MD;  Location: [facility];  Service: Gynecology;  Laterality: Bilateral;   NASAL TURBINATE REDUCTION Bilateral 06/14/2022   Procedure: INFERIOR TURBINATE REDUCTION;  Surgeon: Edda Mt, MD;  Location: [facility];  Service: ENT;  Laterality: Bilateral;   PARATHYROIDECTOMY     PARTIAL HYSTERECTOMY     SEPTOPLASTY N/A  06/14/2022   Procedure: SEPTOPLASTY;  Surgeon: Edda Mt, MD;  Location: [facility];  Service: ENT;  Laterality: N/A;   TRACHELECTOMY N/A 01/25/2019   Procedure: TRACHELECTOMY, laparoscopic cervical;  Surgeon: Lovetta Debby PARAS, MD;  Location: [facility];  Service: Gynecology;  Laterality: N/A;   Patient Active Problem List   Diagnosis Date Noted   Acute ischemic stroke (HCC) 03/27/2024   Influenza A 11/13/2022   Acute respiratory failure with hypoxia (HCC) 11/13/2022   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) 11/13/2022   History of CVA (cerebrovascular accident) 11/13/2022   Elevated troponin 11/13/2022   Acute respiratory failure with hypoxemia (HCC) 11/13/2022   Post-operative state 01/25/2019   ARF (acute renal failure) 04/24/2018   Acute renal failure (ARF) 04/22/2018   Hypercalcemia 05/31/2016   Hypokalemia 05/31/2016   Hypochloremia 05/31/2016   Screening for HIV (human immunodeficiency virus) 05/31/2016   Screen for STD (sexually transmitted disease) 05/31/2016   Vitamin D  deficiency 05/31/2016   Chronic insomnia 05/31/2016   Absolute anemia 07/07/2015   Anxiety and depression 07/07/2015   Essential hypertension 07/07/2015   Acute anxiety 07/07/2015   Primary fibromyalgia syndrome 07/07/2015   Embolic stroke (HCC) 07/17/2014    ONSET DATE: 07/29/2024 referral date, ED date 03/26/24-03/28/24  REFERRING DIAG: Z86.73 (ICD-10-CM) - History of CVA (cerebrovascular accident)  THERAPY DIAG:  Other lack of coordination  Muscle weakness (generalized)  Attention and concentration deficit  Other symptoms and signs involving  the musculoskeletal system  Other disturbances of skin sensation  Rationale for Evaluation and Treatment: Rehabilitation  SUBJECTIVE:   SUBJECTIVE STATEMENT: Things are going okay, I want to know what I need to do to improve function in my right hand Pt accompanied by: self  PERTINENT HISTORY: Pt is a 58 yo female, went to ED on  03/26/24, was in [place] reported feeling hot so went to hospital and was told she had an ischemic stroke and needed surgery, d/t cost went to [facility] ED, reported symptoms had started over 30 hours prior to ED arrival, presented with difficulty speaking. MRI with acute ischemic infarcts.   PRECAUTIONS: Fall R sided weakness, HTN, bleeding precautions  WEIGHT BEARING RESTRICTIONS: No  PAIN:  Are you having pain? 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 No  LIVING ENVIRONMENT: Lives with: lives with their family Lives in: House/apartment Stairs: Yes: Internal: 14 steps; can reach both Has following equipment at home: None  PLOF: Independent, was on disability  PATIENT GOALS: I want to improve the function in the hand and my elbows are hurting,   OBJECTIVE:  Note: Objective measures were completed at Evaluation unless otherwise noted.  HAND DOMINANCE: Right  ADLs: Overall ADLs: Overall l Transfers/ambulation related to ADLs: Eating: Independent, but it feels awkward at times  Grooming:  Independent, but it feels awkward at times  UB Dressing: Independent LB Dressing: Independent Toileting: Independent Bathing: Independent but it feels awkward at times. I don't do it as good as I could with my right hand Tub Shower transfers: Independent, shower/tub combo  Equipment: none  IADLs: Shopping: Independent, takes time Light housekeeping: Independent, takes time Meal Prep: chopping vegetables is very hard, takes a lot of time but can do meal prep Community mobility: Independent Medication management: Independent, takes time Financial management: Independent, takes time Handwriting: 90% legible  MOBILITY STATUS: Independent  POSTURE COMMENTS:  No Significant postural limitations Sitting balance: WNL   ACTIVITY  TOLERANCE: Activity tolerance: Pt requires increased time to complete tasks  FUNCTIONAL OUTCOME MEASURES: To be further assessed prn  UPPER EXTREMITY ROM:  WNL  Active ROM Right eval Left eval  Shoulder flexion    Shoulder abduction    Shoulder adduction    Shoulder extension    Shoulder internal rotation    Shoulder external rotation    Elbow flexion    Elbow extension    Wrist flexion    Wrist extension    Wrist ulnar deviation    Wrist radial deviation    Wrist pronation    Wrist supination    (Blank rows = not tested)  UPPER EXTREMITY MMT:     MMT Right eval Left eval  Shoulder flexion    Shoulder abduction    Shoulder adduction    Shoulder extension    Shoulder internal rotation    Shoulder external rotation    Middle trapezius    Lower trapezius    Elbow flexion    Elbow extension    Wrist flexion    Wrist extension    Wrist ulnar deviation    Wrist radial deviation    Wrist pronation    Wrist supination    (Blank rows = not tested)  HAND FUNCTION: Grip strength: Right: 31.7 lbs; Left: 54.2 lbs  COORDINATION: 9 Hole Peg test: Right: 28.65 sec; Left: 20.78 sec Box and Blocks:  Right 46blocks,  Left 58blocks (noted approximately 3 times pt did place 2 blocks at a time, stopped with a verbal cue)  SENSATION:  sometimes numbness and tingling, reports some difference in temperature between each UE (R hand runs cooler than the L hand)  EDEMA: occasional swelling in hands.   MUSCLE TONE: RUE: Rigidity and Hypertonic  COGNITION: Overall cognitive status: Within functional limits for tasks assessed  VISION: Subjective report: Sometimes blurriness, sometimes hard to see small print Baseline vision: No visual deficits Visual history: none  VISION ASSESSMENT: WFL  Patient has difficulty with following activities due to following visual impairments: none  PERCEPTION: Not tested, further assess in functional context  PRAXIS: Impaired:  Ideation  OBSERVATIONS: impaired FM coordination and hand strength in dominant R hand, concerns with sensation                                                                                                                             TREATMENT DATE: 09/03/24    Educated pt in purpose of OT and reviewed goals and POC. Pt in agreement at this time, stated I've done putty before and removed items from it before, I just want to do as much as I can for this hand. Pt reports occasional edema in R hand, educated pt in edema mgmt, see pt education below.     PATIENT EDUCATION: Education details: Edema mgmt Person educated: Patient Education method: Medical illustrator Education comprehension: verbalized understanding  HOME EXERCISE PROGRAM: To be established   GOALS: Goals reviewed with patient? Yes   SHORT TERM GOALS: Target date: 10/03/24  Pt will be independent with R handed HEP for FM coordination and strength Baseline: To be established  Goal status: INITIAL  2.  Pt will independently verbalize sensory precautions secondary to impaired sensation in R hand with 100% accuracy Baseline: To be educated  Goal status: INITIAL  3.  Pt will demonstrate improved FM coordination by a score of 50 blocks or more with R hand in Box and blocks test  Baseline: 42 R hand, 58 L hand Goal status: INITIAL    LONG TERM GOALS: Target date: 11/03/24  Pt will increase R grip strength by at least 15 pounds for improved functional use  Baseline: 31.7 lbs Goal status: INITIAL  2.  Pt will demonstrate improved FM coordination by a score of 23 seconds or less in R hand for 9 hole peg test  Baseline: 28.65 seconds  Goal status: INITIAL  3. OT will assess function of RUE through UEFS and create goal as appropriate Baseline: To be completed d/t time constraints  Goal status: INITIAL  4.  Pt will report increased ease of cutting foods for meal prep with use of AE as  needed Baseline: Pt reports requiring increased time to cut foods and that it feels weird in both hands to do so. Goal status: INITIAL    ASSESSMENT:  CLINICAL IMPRESSION: Patient is a 58 y.o. female who was  seen today for occupational therapy evaluation for s/p CVA. Hx includes hypertension, anxiety, insomnia, depression and left parietal infarct in 2015 with no residual deficit . Patient currently presents below baseline level of functioning demonstrating functional deficits and impairments as noted below. Pt would benefit from skilled OT services in the outpatient setting to work on impairments as noted below to help pt return to PLOF as able.     PERFORMANCE DEFICITS: in functional skills including IADLs, coordination, dexterity, proprioception, sensation, edema, strength, and Fine motor control, cognitive skills including attention, emotional, safety awareness, and sequencing, and psychosocial skills including coping strategies, environmental adaptation, and routines and behaviors.   IMPAIRMENTS: are limiting patient from IADLs, rest and sleep, and leisure.   CO-MORBIDITIES: has co-morbidities such as HTN that affects occupational performance. Patient will benefit from skilled OT to address above impairments and improve overall function.  MODIFICATION OR ASSISTANCE TO COMPLETE EVALUATION: Min-Moderate modification of tasks or assist with assess necessary to complete an evaluation.  OT OCCUPATIONAL PROFILE AND HISTORY: Detailed assessment: Review of records and additional review of physical, cognitive, psychosocial history related to current functional performance.  CLINICAL DECISION MAKING: Moderate - several treatment options, min-mod task modification necessary  REHAB POTENTIAL: Good  EVALUATION COMPLEXITY: Moderate    PLAN:  OT FREQUENCY: 1x/week  OT DURATION: 8 weeks  PLANNED INTERVENTIONS: 97168 OT Re-evaluation, 97535 self care/ADL training, 02889 therapeutic exercise,  97530 therapeutic activity, 97112 neuromuscular re-education, 97140 manual therapy, 97035 ultrasound, 97018 paraffin, 02960 fluidotherapy, 97010 moist heat, 97010 cryotherapy, 97034 contrast bath, 97032 electrical stimulation (manual), 97760 Orthotic Initial, 97763 Orthotic/Prosthetic subsequent, passive range of motion, functional mobility training, psychosocial skills training, energy conservation, coping strategies training, patient/family education, and DME and/or AE instructions  RECOMMENDED OTHER SERVICES: none at this time  CONSULTED AND AGREED WITH PLAN OF CARE: Patient  PLAN FOR NEXT SESSION: educate in FM coordination Sensory precautions handout Educate pt in AE for cutting foods as needed  Consider grading up theraputty (pt reports having completed this before) depending on level of resistance pt already has Sleep hygiene prn (pt has insomnia)   Rocky Dutch, OT 09/03/2024, 4:58 PM

## 2024-09-03 ENCOUNTER — Ambulatory Visit: Attending: Internal Medicine

## 2024-09-03 DIAGNOSIS — R41840 Attention and concentration deficit: Secondary | ICD-10-CM | POA: Diagnosis present

## 2024-09-03 DIAGNOSIS — R278 Other lack of coordination: Secondary | ICD-10-CM

## 2024-09-03 DIAGNOSIS — R208 Other disturbances of skin sensation: Secondary | ICD-10-CM

## 2024-09-03 DIAGNOSIS — R29898 Other symptoms and signs involving the musculoskeletal system: Secondary | ICD-10-CM

## 2024-09-03 DIAGNOSIS — M6281 Muscle weakness (generalized): Secondary | ICD-10-CM

## 2024-09-10 ENCOUNTER — Ambulatory Visit: Attending: Internal Medicine

## 2024-09-10 DIAGNOSIS — R41844 Frontal lobe and executive function deficit: Secondary | ICD-10-CM | POA: Diagnosis present

## 2024-09-10 DIAGNOSIS — R29898 Other symptoms and signs involving the musculoskeletal system: Secondary | ICD-10-CM | POA: Insufficient documentation

## 2024-09-10 DIAGNOSIS — M6281 Muscle weakness (generalized): Secondary | ICD-10-CM | POA: Diagnosis present

## 2024-09-10 DIAGNOSIS — R278 Other lack of coordination: Secondary | ICD-10-CM | POA: Insufficient documentation

## 2024-09-10 NOTE — Therapy (Signed)
 OUTPATIENT OCCUPATIONAL THERAPY NEURO EVALUATION  Patient Name: Victoria Guzman MRN: [id number] DOB:11-Jan-1966, 58 y.o., female Today's Date: 09/10/2024  PCP: Rudolpho Norleen BIRCH, MD REFERRING PROVIDER: Rudolpho Norleen BIRCH, MD  END OF SESSION:  OT End of Session - 09/10/24 1535     Visit Number 2    Number of Visits 8    Date for Recertification  11/03/24    Authorization Type [employer] [facility]    Authorization Time Period 09/03/24-10/29/24    Authorization - Visit Number 2    Authorization - Number of Visits 6    Progress Note Due on Visit 6    OT Start Time 1533    OT Stop Time 1621    OT Time Calculation (min) 48 min    Equipment Utilized During Treatment rocker knife for demonstration    Activity Tolerance Patient tolerated treatment well    Behavior During Therapy [facility] for tasks assessed/performed          Past Medical History:  Diagnosis Date   Anemia    Anxiety    Chronic fatigue    Chronic insomnia 05/31/2016   Depression    Family history of adverse reaction to anesthesia    mother difficult to wake up after CABG   Fibromyalgia    Hypertension    Insomnia    Panic attack    Stroke (HCC)    tia   Vitamin D  deficiency 05/31/2016   Past Surgical History:  Procedure Laterality Date   ABDOMINAL HYSTERECTOMY     COLONOSCOPY WITH PROPOFOL  N/A 07/24/2018   Procedure: COLONOSCOPY WITH PROPOFOL ;  Surgeon: Toledo, Ladell POUR, MD;  Location: [facility];  Service: Gastroenterology;  Laterality: N/A;   ESOPHAGOGASTRODUODENOSCOPY (EGD) WITH PROPOFOL  N/A 07/24/2018   Procedure: ESOPHAGOGASTRODUODENOSCOPY (EGD) WITH PROPOFOL ;  Surgeon: Toledo, Ladell POUR, MD;  Location: [facility];  Service: Gastroenterology;  Laterality: N/A;   LAPAROSCOPIC BILATERAL SALPINGECTOMY Bilateral 01/25/2019   Procedure: LAPAROSCOPIC BILATERAL SALPINGECTOMY;  Surgeon: Schermerhorn, Debby PARAS, MD;  Location: [facility];  Service: Gynecology;  Laterality: Bilateral;   NASAL TURBINATE REDUCTION Bilateral  06/14/2022   Procedure: INFERIOR TURBINATE REDUCTION;  Surgeon: Edda Mt, MD;  Location: [facility];  Service: ENT;  Laterality: Bilateral;   PARATHYROIDECTOMY     PARTIAL HYSTERECTOMY     SEPTOPLASTY N/A 06/14/2022   Procedure: SEPTOPLASTY;  Surgeon: Edda Mt, MD;  Location: [facility];  Service: ENT;  Laterality: N/A;   TRACHELECTOMY N/A 01/25/2019   Procedure: TRACHELECTOMY, laparoscopic cervical;  Surgeon: Lovetta Debby PARAS, MD;  Location: [facility];  Service: Gynecology;  Laterality: N/A;   Patient Active Problem List   Diagnosis Date Noted   Acute ischemic stroke (HCC) 03/27/2024   Influenza A 11/13/2022   Acute respiratory failure with hypoxia (HCC) 11/13/2022   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) 11/13/2022   History of CVA (cerebrovascular accident) 11/13/2022   Elevated troponin 11/13/2022   Acute respiratory failure with hypoxemia (HCC) 11/13/2022   Post-operative state 01/25/2019   ARF (acute renal failure) 04/24/2018   Acute renal failure (ARF) 04/22/2018   Hypercalcemia 05/31/2016   Hypokalemia 05/31/2016   Hypochloremia 05/31/2016   Screening for HIV (human immunodeficiency virus) 05/31/2016   Screen for STD (sexually transmitted disease) 05/31/2016   Vitamin D  deficiency 05/31/2016   Chronic insomnia 05/31/2016   Absolute anemia 07/07/2015   Anxiety and depression 07/07/2015   Essential hypertension 07/07/2015   Acute anxiety 07/07/2015   Primary fibromyalgia syndrome 07/07/2015   Embolic stroke (HCC) 07/17/2014  ONSET DATE: 07/29/2024 referral date, ED date 03/26/24-03/28/24  REFERRING DIAG: Z86.73 (ICD-10-CM) - History of CVA (cerebrovascular accident)  THERAPY DIAG:  Other lack of coordination  Muscle weakness (generalized)  Other symptoms and signs involving the musculoskeletal system  Rationale for Evaluation and Treatment: Rehabilitation  SUBJECTIVE:   SUBJECTIVE STATEMENT: Nothing's different, I'm always in  pain.  Pt accompanied by: self  PERTINENT HISTORY: Pt is a 58 yo female, went to ED on 03/26/24, was in [place] reported feeling hot so went to hospital and was told she had an ischemic stroke and needed surgery, d/t cost went to [facility] ED, reported symptoms had started over 30 hours prior to ED arrival, presented with difficulty speaking. MRI with acute ischemic infarcts.   PRECAUTIONS: Fall R sided weakness, HTN, bleeding precautions  WEIGHT BEARING RESTRICTIONS: No  PAIN:  Are you having pain? 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 No  LIVING ENVIRONMENT: Lives with: lives with their family Lives in: House/apartment Stairs: Yes: Internal: 14 steps; can reach both Has following equipment at home: None  PLOF: Independent, was on disability  PATIENT GOALS: I want to improve the function in the hand and my elbows are hurting,   OBJECTIVE:  Note: Objective measures were completed at Evaluation unless otherwise noted.  HAND DOMINANCE: Right  ADLs: Overall ADLs: Overall l Transfers/ambulation related to ADLs: Eating: Independent, but it feels awkward at times  Grooming:  Independent, but it feels awkward at times  UB Dressing: Independent LB Dressing: Independent Toileting: Independent Bathing: Independent but it feels awkward at times. I don't do it as good as I could with my right hand Tub Shower transfers: Independent, shower/tub combo  Equipment: none  IADLs: Shopping: Independent, takes time Light housekeeping: Independent, takes time Meal Prep: chopping vegetables is very hard, takes a lot of time but can do meal prep Community mobility: Independent Medication management: Independent, takes time Financial management: Independent, takes time Handwriting: 90% legible  MOBILITY STATUS: Independent  POSTURE COMMENTS:   No Significant postural limitations Sitting balance: WNL   ACTIVITY TOLERANCE: Activity tolerance: Pt requires increased time to complete tasks  FUNCTIONAL OUTCOME MEASURES: To be further assessed prn  UPPER EXTREMITY ROM:  WNL  Active ROM Right eval Left eval  Shoulder flexion    Shoulder abduction    Shoulder adduction    Shoulder extension    Shoulder internal rotation    Shoulder external rotation    Elbow flexion    Elbow extension    Wrist flexion    Wrist extension    Wrist ulnar deviation    Wrist radial deviation    Wrist pronation    Wrist supination    (Blank rows = not tested)  UPPER EXTREMITY MMT:     MMT Right eval Left eval  Shoulder flexion    Shoulder abduction    Shoulder adduction    Shoulder extension    Shoulder internal rotation    Shoulder external rotation    Middle trapezius    Lower trapezius    Elbow flexion    Elbow extension    Wrist flexion    Wrist extension    Wrist ulnar deviation    Wrist radial deviation    Wrist pronation    Wrist supination    (Blank rows = not tested)  HAND FUNCTION: Grip strength: Right: 31.7 lbs; Left: 54.2 lbs  COORDINATION: 9 Hole Peg test: Right: 28.65 sec; Left: 20.78 sec Box and Blocks:  Right 46blocks, Left 58blocks (noted approximately 3 times pt did place 2 blocks at a time, stopped with a verbal cue)  SENSATION:  sometimes numbness and tingling, reports some difference in temperature between each UE (R hand runs cooler than the L hand)  EDEMA: occasional swelling in hands.   MUSCLE TONE: RUE: Rigidity and Hypertonic  COGNITION: Overall cognitive status: Within functional limits for tasks assessed  VISION: Subjective report: Sometimes blurriness, sometimes hard to see small print Baseline vision: No visual deficits Visual history: none  VISION ASSESSMENT: WFL  Patient has difficulty with following activities due to following visual impairments: none  PERCEPTION: Not  tested, further assess in functional context  PRAXIS: Impaired: Ideation  OBSERVATIONS: impaired FM coordination and hand strength in dominant R hand, concerns with sensation                                                                                                                             TREATMENT DATE: 09/10/24   Educated pt in adaptive equipment for chopping and cutting foods, including rocker knife and mandolin with different blades for different chopping and cutting techniques. Showed pt where to obtain these, pt did express interest in these. Educated pt in coordination activities, it should be noted that pt did have difficulty pushing cards off with R thumb, this was not included in handout this date (see pt instructions for handout with instructed activities).     PATIENT EDUCATION: Education details: FM coordination activities, AE for cutting foods and for where to obtain them Person educated: Patient Education method: Explanation, Demonstration, and Handouts Education comprehension: verbalized understanding and returned demonstration  HOME EXERCISE PROGRAM: 09/10/24: Coordination (see pt handout)   GOALS: Goals reviewed with patient? Yes    SHORT TERM GOALS: Target date: 10/03/24  Pt will be independent with R handed HEP for FM coordination and strength Baseline: To be established  Goal status: IN PROGRESS  2.  Pt will independently verbalize sensory precautions secondary to impaired sensation in R hand with 100% accuracy Baseline: To be educated  Goal status: INITIAL  3.  Pt will demonstrate improved FM coordination by a score of 50 blocks or more with R hand in Box and blocks test  Baseline: 42 R hand, 58 L hand Goal status: INITIAL    LONG TERM GOALS: Target date: 11/03/24  Pt will increase R grip strength by at least 15 pounds for improved functional use  Baseline: 31.7 lbs Goal status: INITIAL  2.  Pt will demonstrate improved FM coordination  by a score of 23 seconds or less in R hand for 9 hole peg test  Baseline: 28.65 seconds  Goal status: INITIAL  3. OT will assess function of RUE through UEFS and create goal as appropriate Baseline: To be completed d/t time constraints  Goal status: INITIAL  4.  Pt will report increased ease  of cutting foods for meal prep with use of AE as needed Baseline: Pt reports requiring increased time to cut foods and that it feels weird in both hands to do so. Goal status: IN PROGRESS (EDUCATED IN AE AND WHERE TO OBTAIN)    ASSESSMENT:  CLINICAL IMPRESSION: Patient is a 58 y.o. female who was seen today for occupational therapy tx for s/p CVA. Hx includes hypertension, anxiety, insomnia, depression and left parietal infarct in 2015 with no residual deficit. Pt expressing interest in educated AE including rocker knife and mandolin for cutting vegetables. Some frustration noted with FM coordination activities, but did ultimately participate.   PERFORMANCE DEFICITS: in functional skills including IADLs, coordination, dexterity, proprioception, sensation, edema, strength, and Fine motor control, cognitive skills including attention, emotional, safety awareness, and sequencing, and psychosocial skills including coping strategies, environmental adaptation, and routines and behaviors.   IMPAIRMENTS: are limiting patient from IADLs, rest and sleep, and leisure.   CO-MORBIDITIES: has co-morbidities such as HTN that affects occupational performance. Patient will benefit from skilled OT to address above impairments and improve overall function.  MODIFICATION OR ASSISTANCE TO COMPLETE EVALUATION: Min-Moderate modification of tasks or assist with assess necessary to complete an evaluation.  OT OCCUPATIONAL PROFILE AND HISTORY: Detailed assessment: Review of records and additional review of physical, cognitive, psychosocial history related to current functional performance.  CLINICAL DECISION MAKING:  Moderate - several treatment options, min-mod task modification necessary  REHAB POTENTIAL: Good  EVALUATION COMPLEXITY: Moderate    PLAN:  OT FREQUENCY: 1x/week  OT DURATION: 8 weeks  PLANNED INTERVENTIONS: 97168 OT Re-evaluation, 97535 self care/ADL training, 02889 therapeutic exercise, 97530 therapeutic activity, 97112 neuromuscular re-education, 97140 manual therapy, 97035 ultrasound, 97018 paraffin, 02960 fluidotherapy, 97010 moist heat, 97010 cryotherapy, 97034 contrast bath, 97032 electrical stimulation (manual), 97760 Orthotic Initial, 97763 Orthotic/Prosthetic subsequent, passive range of motion, functional mobility training, psychosocial skills training, energy conservation, coping strategies training, patient/family education, and DME and/or AE instructions  RECOMMENDED OTHER SERVICES: none at this time  CONSULTED AND AGREED WITH PLAN OF CARE: Patient  PLAN FOR NEXT SESSION: Continue FM coordination activities UEFS scale? F/u on AE for cutting vegetables (rocker knife or mandolin?)    [employer], OT 09/10/2024, 4:37 PM

## 2024-09-10 NOTE — Patient Instructions (Signed)
  Coordination Activities  Perform the following activities for 5-10 minutes 1 times per day with right hand(s).    Rotate ball in fingertips (clockwise and counter-clockwise). Flip cards 1 at a time as fast as you can. Pick up coins and stack.

## 2024-09-17 ENCOUNTER — Ambulatory Visit

## 2024-09-17 DIAGNOSIS — R278 Other lack of coordination: Secondary | ICD-10-CM

## 2024-09-17 DIAGNOSIS — M6281 Muscle weakness (generalized): Secondary | ICD-10-CM

## 2024-09-17 DIAGNOSIS — R41844 Frontal lobe and executive function deficit: Secondary | ICD-10-CM

## 2024-09-17 DIAGNOSIS — R29898 Other symptoms and signs involving the musculoskeletal system: Secondary | ICD-10-CM

## 2024-09-17 NOTE — Therapy (Signed)
 OUTPATIENT OCCUPATIONAL THERAPY NEURO EVALUATION  Patient Name: Victoria Guzman MRN: [id number] DOB:[birth year], 58 y.o., female Today's Date: 09/17/2024  PCP: Rudolpho Norleen BIRCH, MD REFERRING PROVIDER: Rudolpho Norleen BIRCH, MD  END OF SESSION:  OT End of Session - 09/17/24 1701     Visit Number 3    Number of Visits 8    Date for Recertification  11/03/24    Authorization Type [employer] [facility]    Authorization Time Period 09/03/24-10/29/24    Authorization - Visit Number 3    Authorization - Number of Visits 6    Progress Note Due on Visit 6    OT Start Time 1320    OT Stop Time 1405    OT Time Calculation (min) 45 min    Equipment Utilized During Treatment rocker knife, green theraputty, adaptive cutting board, rubber band    Activity Tolerance Patient tolerated treatment well    Behavior During Therapy WFL for tasks assessed/performed           Past Medical History:  Diagnosis Date   Anemia    Anxiety    Chronic fatigue    Chronic insomnia 05/31/2016   Depression    Family history of adverse reaction to anesthesia    mother difficult to wake up after CABG   Fibromyalgia    Hypertension    Insomnia    Panic attack    Stroke (HCC)    tia   Vitamin D  deficiency 05/31/2016   Past Surgical History:  Procedure Laterality Date   ABDOMINAL HYSTERECTOMY     COLONOSCOPY WITH PROPOFOL  N/A 07/24/2018   Procedure: COLONOSCOPY WITH PROPOFOL ;  Surgeon: Toledo, Ladell POUR, MD;  Location: [facility];  Service: Gastroenterology;  Laterality: N/A;   ESOPHAGOGASTRODUODENOSCOPY (EGD) WITH PROPOFOL  N/A 07/24/2018   Procedure: ESOPHAGOGASTRODUODENOSCOPY (EGD) WITH PROPOFOL ;  Surgeon: Toledo, Ladell POUR, MD;  Location: [facility];  Service: Gastroenterology;  Laterality: N/A;   LAPAROSCOPIC BILATERAL SALPINGECTOMY Bilateral 01/25/2019   Procedure: LAPAROSCOPIC BILATERAL SALPINGECTOMY;  Surgeon: Schermerhorn, Debby PARAS, MD;  Location: [facility];  Service: Gynecology;  Laterality: Bilateral;    NASAL TURBINATE REDUCTION Bilateral 06/14/2022   Procedure: INFERIOR TURBINATE REDUCTION;  Surgeon: Edda Mt, MD;  Location: [facility];  Service: ENT;  Laterality: Bilateral;   PARATHYROIDECTOMY     PARTIAL HYSTERECTOMY     SEPTOPLASTY N/A 06/14/2022   Procedure: SEPTOPLASTY;  Surgeon: Edda Mt, MD;  Location: [facility];  Service: ENT;  Laterality: N/A;   TRACHELECTOMY N/A 01/25/2019   Procedure: TRACHELECTOMY, laparoscopic cervical;  Surgeon: Lovetta Debby PARAS, MD;  Location: [facility];  Service: Gynecology;  Laterality: N/A;   Patient Active Problem List   Diagnosis Date Noted   Acute ischemic stroke (HCC) 03/27/2024   Influenza A 11/13/2022   Acute respiratory failure with hypoxia (HCC) 11/13/2022   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) 11/13/2022   History of CVA (cerebrovascular accident) 11/13/2022   Elevated troponin 11/13/2022   Acute respiratory failure with hypoxemia (HCC) 11/13/2022   Post-operative state 01/25/2019   ARF (acute renal failure) 04/24/2018   Acute renal failure (ARF) 04/22/2018   Hypercalcemia 05/31/2016   Hypokalemia 05/31/2016   Hypochloremia 05/31/2016   Screening for HIV (human immunodeficiency virus) 05/31/2016   Screen for STD (sexually transmitted disease) 05/31/2016   Vitamin D  deficiency 05/31/2016   Chronic insomnia 05/31/2016   Absolute anemia 07/07/2015   Anxiety and depression 07/07/2015   Essential hypertension 07/07/2015   Acute anxiety 07/07/2015   Primary fibromyalgia syndrome 07/07/2015  Embolic stroke (HCC) 07/17/2014    ONSET DATE: 07/29/2024 referral date, ED date 03/26/24-03/28/24  REFERRING DIAG: Z86.73 (ICD-10-CM) - History of CVA (cerebrovascular accident)  THERAPY DIAG:  Other lack of coordination  Muscle weakness (generalized)  Frontal lobe and executive function deficit  Other symptoms and signs involving the musculoskeletal system  Rationale for Evaluation and Treatment:  Rehabilitation  SUBJECTIVE:   SUBJECTIVE STATEMENT: Nothing's different, I'm always in pain. Homecoming was good though, but I can't really wave with my R hand, like I can't do the wrist movement. Pt accompanied by: self  PERTINENT HISTORY: Pt is a 58 yo female, went to ED on 03/26/24, was in [place] reported feeling hot so went to hospital and was told she had an ischemic stroke and needed surgery, d/t cost went to [facility] ED, reported symptoms had started over 30 hours prior to ED arrival, presented with difficulty speaking. MRI with acute ischemic infarcts.   PRECAUTIONS: Fall R sided weakness, HTN, bleeding precautions  WEIGHT BEARING RESTRICTIONS: No  PAIN:  Are you having pain? 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 No  LIVING ENVIRONMENT: Lives with: lives with their family Lives in: House/apartment Stairs: Yes: Internal: 14 steps; can reach both Has following equipment at home: None  PLOF: Independent, was on disability  PATIENT GOALS: I want to improve the function in the hand and my elbows are hurting,   OBJECTIVE:  Note: Objective measures were completed at Evaluation unless otherwise noted.  HAND DOMINANCE: Right  ADLs: Overall ADLs: Overall l Transfers/ambulation related to ADLs: Eating: Independent, but it feels awkward at times  Grooming:  Independent, but it feels awkward at times  UB Dressing: Independent LB Dressing: Independent Toileting: Independent Bathing: Independent but it feels awkward at times. I don't do it as good as I could with my right hand Tub Shower transfers: Independent, shower/tub combo  Equipment: none  IADLs: Shopping: Independent, takes time Light housekeeping: Independent, takes time Meal Prep: chopping vegetables is very hard, takes a lot of time but can do meal prep Community  mobility: Independent Medication management: Independent, takes time Financial management: Independent, takes time Handwriting: 90% legible  MOBILITY STATUS: Independent  POSTURE COMMENTS:  No Significant postural limitations Sitting balance: WNL   ACTIVITY TOLERANCE: Activity tolerance: Pt requires increased time to complete tasks  FUNCTIONAL OUTCOME MEASURES: PSFS:    UPPER EXTREMITY ROM:  WNL  Active ROM Right eval Left eval  Shoulder flexion    Shoulder abduction    Shoulder adduction    Shoulder extension    Shoulder internal rotation    Shoulder external rotation    Elbow flexion    Elbow extension    Wrist flexion    Wrist extension    Wrist ulnar deviation    Wrist radial deviation    Wrist pronation    Wrist supination    (Blank rows = not tested)  UPPER EXTREMITY MMT:     MMT Right eval Left eval  Shoulder flexion    Shoulder abduction    Shoulder adduction    Shoulder extension    Shoulder internal rotation    Shoulder external rotation    Middle trapezius    Lower trapezius    Elbow flexion    Elbow extension    Wrist flexion    Wrist extension    Wrist ulnar deviation    Wrist  radial deviation    Wrist pronation    Wrist supination    (Blank rows = not tested)  HAND FUNCTION: Grip strength: Right: 31.7 lbs; Left: 54.2 lbs  COORDINATION: 9 Hole Peg test: Right: 28.65 sec; Left: 20.78 sec Box and Blocks:  Right 46blocks, Left 58blocks (noted approximately 3 times pt did place 2 blocks at a time, stopped with a verbal cue)  SENSATION:  sometimes numbness and tingling, reports some difference in temperature between each UE (R hand runs cooler than the L hand)  EDEMA: occasional swelling in hands.   MUSCLE TONE: RUE: Rigidity and Hypertonic  COGNITION: Overall cognitive status: Within functional limits for tasks assessed  VISION: Subjective report: Sometimes blurriness, sometimes hard to see small print Baseline vision: No  visual deficits Visual history: none  VISION ASSESSMENT: WFL  Patient has difficulty with following activities due to following visual impairments: none  PERCEPTION: Not tested, further assess in functional context  PRAXIS: Impaired: Ideation  OBSERVATIONS: impaired FM coordination and hand strength in dominant R hand, concerns with sensation                                                                                                                             TREATMENT DATE: 09/17/24  Allowed pt to trial rocker knife, pt demonstrated good understanding using rocker knife in rocking motions nad cutting as needed to cut green theraputty for simulation of cutting food. Pt reports looking into AE previously educated in, has not purchased yet. Agreed to keep Ot in loop. Pt educated in add'l FM coordination activity of tearing up tissues and balling into tiny wads, pt educated in completing this on tabletop for increased ease and manipulation of tissue. Therapeutic exercise completed to increase R grip strength and dexterity in hand. See pt instructions for detailed handout of HEP provided for rubber band exercises for R hand.     PATIENT EDUCATION: Education details: SEE ABOVE Person educated: Patient Education method: Explanation, Demonstration, and Handouts Education comprehension: verbalized understanding and returned demonstration  HOME EXERCISE PROGRAM: 09/10/24: Coordination (see pt handout) 09/17/24: [place] band exercises (ACCESS CODE:  [id number])   GOALS: Goals reviewed with patient? Yes    SHORT TERM GOALS: Target date: 10/03/24  Pt will be independent with R handed HEP for FM coordination and strength Baseline: To be established  Goal status: IN PROGRESS  2.  Pt will independently verbalize sensory precautions secondary to impaired sensation in R hand with 100% accuracy Baseline: To be educated  Goal status: INITIAL  3.  Pt will demonstrate improved FM  coordination by a score of 50 blocks or more with R hand in Box and blocks test  Baseline: 42 R hand, 58 L hand Goal status: INITIAL    LONG TERM GOALS: Target date: 11/03/24  Pt will increase R grip strength by at least 15 pounds for improved functional use  Baseline: 31.7 lbs Goal status: INITIAL  2.  Pt will demonstrate  improved FM coordination by a score of 23 seconds or less in R hand for 9 hole peg test  Baseline: 28.65 seconds  Goal status: INITIAL  3. OT will assess function of RUE through UEFS and create goal as appropriate Baseline: To be completed d/t time constraints  Goal status: INITIAL  4.  Pt will report increased ease of cutting foods for meal prep with use of AE as needed Baseline: Pt reports requiring increased time to cut foods and that it feels weird in both hands to do so. Goal status: IN PROGRESS (EDUCATED IN AE AND WHERE TO OBTAIN)    ASSESSMENT:  CLINICAL IMPRESSION: Patient is a 58 y.o. female who was seen today for occupational therapy tx for s/p CVA. Hx includes hypertension, anxiety, insomnia, depression and left parietal infarct in 2015 with no residual deficit. Pt participating well with coordination and exercises for R hand dexterity and strength, and demonstrates good use of cutting food with rocker knife. Would benefit from continued OT services to improve ability to complete ADL/IADL with improved function in R hand  PERFORMANCE DEFICITS: in functional skills including IADLs, coordination, dexterity, proprioception, sensation, edema, strength, and Fine motor control, cognitive skills including attention, emotional, safety awareness, and sequencing, and psychosocial skills including coping strategies, environmental adaptation, and routines and behaviors.   IMPAIRMENTS: are limiting patient from IADLs, rest and sleep, and leisure.   CO-MORBIDITIES: has co-morbidities such as HTN that affects occupational performance. Patient will benefit from  skilled OT to address above impairments and improve overall function.  MODIFICATION OR ASSISTANCE TO COMPLETE EVALUATION: Min-Moderate modification of tasks or assist with assess necessary to complete an evaluation.  OT OCCUPATIONAL PROFILE AND HISTORY: Detailed assessment: Review of records and additional review of physical, cognitive, psychosocial history related to current functional performance.  CLINICAL DECISION MAKING: Moderate - several treatment options, min-mod task modification necessary  REHAB POTENTIAL: Good  EVALUATION COMPLEXITY: Moderate    PLAN:  OT FREQUENCY: 1x/week  OT DURATION: 8 weeks  PLANNED INTERVENTIONS: 97168 OT Re-evaluation, 97535 self care/ADL training, 02889 therapeutic exercise, 97530 therapeutic activity, 97112 neuromuscular re-education, 97140 manual therapy, 97035 ultrasound, 97018 paraffin, 02960 fluidotherapy, 97010 moist heat, 97010 cryotherapy, 97034 contrast bath, 97032 electrical stimulation (manual), 97760 Orthotic Initial, 97763 Orthotic/Prosthetic subsequent, passive range of motion, functional mobility training, psychosocial skills training, energy conservation, coping strategies training, patient/family education, and DME and/or AE instructions  RECOMMENDED OTHER SERVICES: none at this time  CONSULTED AND AGREED WITH PLAN OF CARE: Patient  PLAN FOR NEXT SESSION: Continue FM coordination  F/u AE  F/u rubber band exercises     Rocky Dutch, OT 09/17/2024, 5:09 PM

## 2024-09-24 ENCOUNTER — Ambulatory Visit

## 2024-10-01 ENCOUNTER — Ambulatory Visit

## 2024-10-01 DIAGNOSIS — R278 Other lack of coordination: Secondary | ICD-10-CM

## 2024-10-01 DIAGNOSIS — R29898 Other symptoms and signs involving the musculoskeletal system: Secondary | ICD-10-CM

## 2024-10-01 DIAGNOSIS — M6281 Muscle weakness (generalized): Secondary | ICD-10-CM

## 2024-10-01 NOTE — Therapy (Signed)
 OUTPATIENT OCCUPATIONAL THERAPY NEURO EVALUATION  Patient Name: Victoria Guzman MRN: [id number] DOB:23-Apr-1966, 58 y.o., female Today's Date: 10/01/2024  PCP: Rudolpho Norleen BIRCH, MD REFERRING PROVIDER: Rudolpho Norleen BIRCH, MD  END OF SESSION:  OT End of Session - 10/01/24 1641     Visit Number 4    Number of Visits 8    Date for Recertification  11/03/24    Authorization Type [employer] [facility]    Authorization Time Period 09/03/24-10/29/24    Authorization - Visit Number 3    Authorization - Number of Visits 6    Progress Note Due on Visit 6    OT Start Time 1532    OT Stop Time 1620    OT Time Calculation (min) 48 min    Equipment Utilized During Treatment green theraputty, Digiflex (5-9# resistance)    Activity Tolerance Patient limited by fatigue;Treatment limited secondary to agitation    Behavior During Therapy Agitated            Past Medical History:  Diagnosis Date   Anemia    Anxiety    Chronic fatigue    Chronic insomnia 05/31/2016   Depression    Family history of adverse reaction to anesthesia    mother difficult to wake up after CABG   Fibromyalgia    Hypertension    Insomnia    Panic attack    Stroke [facility])    tia   Vitamin D  deficiency 05/31/2016   Past Surgical History:  Procedure Laterality Date   ABDOMINAL HYSTERECTOMY     COLONOSCOPY WITH PROPOFOL  N/A 07/24/2018   Procedure: COLONOSCOPY WITH PROPOFOL ;  Surgeon: Toledo, Ladell POUR, MD;  Location: [facility];  Service: Gastroenterology;  Laterality: N/A;   ESOPHAGOGASTRODUODENOSCOPY (EGD) WITH PROPOFOL  N/A 07/24/2018   Procedure: ESOPHAGOGASTRODUODENOSCOPY (EGD) WITH PROPOFOL ;  Surgeon: Toledo, Ladell POUR, MD;  Location: [facility];  Service: Gastroenterology;  Laterality: N/A;   LAPAROSCOPIC BILATERAL SALPINGECTOMY Bilateral 01/25/2019   Procedure: LAPAROSCOPIC BILATERAL SALPINGECTOMY;  Surgeon: Schermerhorn, Debby PARAS, MD;  Location: [facility] OR[facility];  Service: Gynecology;  Laterality: Bilateral;   NASAL  TURBINATE REDUCTION Bilateral 06/14/2022   Procedure: INFERIOR TURBINATE REDUCTION;  Surgeon: Edda Mt, MD;  Location: [facility];  Service: ENT;  Laterality: Bilateral;   PARATHYROIDECTOMY     PARTIAL HYSTERECTOMY     SEPTOPLASTY N/A 06/14/2022   Procedure: SEPTOPLASTY;  Surgeon: Edda Mt, MD;  Location: [facility];  Service: ENT;  Laterality: N/A;   TRACHELECTOMY N/A 01/25/2019   Procedure: TRACHELECTOMY, laparoscopic cervical;  Surgeon: Lovetta Debby PARAS, MD;  Location: [facility];  Service: Gynecology;  Laterality: N/A;   Patient Active Problem List   Diagnosis Date Noted   Acute ischemic stroke (HCC) 03/27/2024   Influenza A 11/13/2022   Acute respiratory failure with hypoxia (HCC) 11/13/2022   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) 11/13/2022   History of CVA (cerebrovascular accident) 11/13/2022   Elevated troponin 11/13/2022   Acute respiratory failure with hypoxemia (HCC) 11/13/2022   Post-operative state 01/25/2019   ARF (acute renal failure) 04/24/2018   Acute renal failure (ARF) 04/22/2018   Hypercalcemia 05/31/2016   Hypokalemia 05/31/2016   Hypochloremia 05/31/2016   Screening for HIV (human immunodeficiency virus) 05/31/2016   Screen for STD (sexually transmitted disease) 05/31/2016   Vitamin D  deficiency 05/31/2016   Chronic insomnia 05/31/2016   Absolute anemia 07/07/2015   Anxiety and depression 07/07/2015   Essential hypertension 07/07/2015   Acute anxiety 07/07/2015   Primary fibromyalgia syndrome 07/07/2015   Embolic  stroke (HCC) 07/17/2014    ONSET DATE: 07/29/2024 referral date, ED date 03/26/24-03/28/24  REFERRING DIAG: Z86.73 (ICD-10-CM) - History of CVA (cerebrovascular accident)  THERAPY DIAG:  Other lack of coordination  Muscle weakness (generalized)  Other symptoms and signs involving the musculoskeletal system  Rationale for Evaluation and Treatment: Rehabilitation  SUBJECTIVE:   SUBJECTIVE STATEMENT: Pt  reports fatigue with moving, is in process of moving into her own house. Pt accompanied by: self  PERTINENT HISTORY: Pt is a 58 yo female, went to ED on 03/26/24, was in [place] reported feeling hot so went to hospital and was told she had an ischemic stroke and needed surgery, d/t cost went to [facility] ED, reported symptoms had started over 30 hours prior to ED arrival, presented with difficulty speaking. MRI with acute ischemic infarcts.   PRECAUTIONS: Fall R sided weakness, HTN, bleeding precautions  WEIGHT BEARING RESTRICTIONS: No  PAIN:  Are you having pain? 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 No  LIVING ENVIRONMENT: Lives with: lives with their family Lives in: House/apartment Stairs: Yes: Internal: 14 steps; can reach both Has following equipment at home: None  PLOF: Independent, was on disability  PATIENT GOALS: I want to improve the function in the hand and my elbows are hurting,   OBJECTIVE:  Note: Objective measures were completed at Evaluation unless otherwise noted.  HAND DOMINANCE: Right  ADLs: Overall ADLs: Overall l Transfers/ambulation related to ADLs: Eating: Independent, but it feels awkward at times  Grooming:  Independent, but it feels awkward at times  UB Dressing: Independent LB Dressing: Independent Toileting: Independent Bathing: Independent but it feels awkward at times. I don't do it as good as I could with my right hand Tub Shower transfers: Independent, shower/tub combo  Equipment: none  IADLs: Shopping: Independent, takes time Light housekeeping: Independent, takes time Meal Prep: chopping vegetables is very hard, takes a lot of time but can do meal prep Community mobility: Independent Medication management: Independent, takes time Financial management: Independent, takes time Handwriting:  90% legible  MOBILITY STATUS: Independent  POSTURE COMMENTS:  No Significant postural limitations Sitting balance: WNL   ACTIVITY TOLERANCE: Activity tolerance: Pt requires increased time to complete tasks  FUNCTIONAL OUTCOME MEASURES: PSFS:    UPPER EXTREMITY ROM:  WNL  Active ROM Right eval Left eval  Shoulder flexion    Shoulder abduction    Shoulder adduction    Shoulder extension    Shoulder internal rotation    Shoulder external rotation    Elbow flexion    Elbow extension    Wrist flexion    Wrist extension    Wrist ulnar deviation    Wrist radial deviation    Wrist pronation    Wrist supination    (Blank rows = not tested)  UPPER EXTREMITY MMT:     MMT Right eval Left eval  Shoulder flexion    Shoulder abduction    Shoulder adduction    Shoulder extension    Shoulder internal rotation    Shoulder external rotation    Middle trapezius    Lower trapezius    Elbow flexion    Elbow extension    Wrist flexion    Wrist extension    Wrist ulnar deviation    Wrist radial deviation    Wrist pronation    Wrist supination    (Blank rows = not tested)  HAND FUNCTION: Grip strength: Right: 31.7 lbs; Left: 54.2 lbs  COORDINATION: 9 Hole Peg test: Right: 28.65 sec; Left: 20.78 sec Box and Blocks:  Right 46blocks, Left 58blocks (noted approximately 3 times pt did place 2 blocks at a time, stopped with a verbal cue)  SENSATION:  sometimes numbness and tingling, reports some difference in temperature between each UE (R hand runs cooler than the L hand)  EDEMA: occasional swelling in hands.   MUSCLE TONE: RUE: Rigidity and Hypertonic  COGNITION: Overall cognitive status: Within functional limits for tasks assessed  VISION: Subjective report: Sometimes blurriness, sometimes hard to see small print Baseline vision: No visual deficits Visual history: none  VISION ASSESSMENT: WFL  Patient has difficulty with following activities due to following  visual impairments: none  PERCEPTION: Not tested, further assess in functional context  PRAXIS: Impaired: Ideation  OBSERVATIONS: impaired FM coordination and hand strength in dominant R hand, concerns with sensation                                                                                                                             TREATMENT DATE: 10/01/24  Informed pt of limited visits and process of requesting more, pt was agitated upon learning this but did participate in tx.   Pt completed grip strengthening tasks first with green theraputty, removing various small items from putty. Pt reports completing at home and has all different resistances.   Pt also engaged in grip strengthening with clamp at level 1 resistance with silver coil removing pegs from pegboard. Next educated pt in Digiflex for further grip and finger strengthening, educated in different resistances and how to work both gross strength and finger strength. Pt c/o poor FM coordination of R hand (noted impaired ability to extend digits away from Digiflex when working on individual finger flexion). Educated pt in recovery process of stroke and how coordination typically is the last function in hand to return, but encouraged to continue utilizing R hand for FM coordination tasks as much as possible. Pt reported understanding.    PATIENT EDUCATION: Education details: SEE ABOVE Person educated: Patient Education method: Explanation, Demonstration, and Handouts Education comprehension: verbalized understanding and returned demonstration  HOME EXERCISE PROGRAM: 09/10/24: Coordination (see pt handout) 09/17/24: [place] band exercises (ACCESS CODE:  [id number])   GOALS: Goals reviewed with patient? Yes    SHORT TERM GOALS: Target date: 10/03/24  Pt will be independent with R handed HEP for FM coordination and strength Baseline: To be established  Goal status: IN PROGRESS  2.  Pt will independently verbalize  sensory precautions secondary to impaired sensation in R hand with 100% accuracy Baseline: To be educated  Goal status: INITIAL  3.  Pt will demonstrate improved FM coordination by a score of 50 blocks or more with R hand in Box and blocks test  Baseline: 42 R hand, 58 L hand Goal status: INITIAL    LONG TERM GOALS: Target date: 11/03/24  Pt  will increase R grip strength by at least 15 pounds for improved functional use  Baseline: 31.7 lbs Goal status: INITIAL  2.  Pt will demonstrate improved FM coordination by a score of 23 seconds or less in R hand for 9 hole peg test  Baseline: 28.65 seconds  Goal status: INITIAL  3. OT will assess function of RUE through UEFS and create goal as appropriate Baseline: To be completed d/t time constraints  Goal status: INITIAL  4.  Pt will report increased ease of cutting foods for meal prep with use of AE as needed Baseline: Pt reports requiring increased time to cut foods and that it feels weird in both hands to do so. Goal status: IN PROGRESS (EDUCATED IN AE AND WHERE TO OBTAIN)    ASSESSMENT:  CLINICAL IMPRESSION: Patient is a 58 y.o. female who was seen today for occupational therapy tx for s/p CVA. Hx includes hypertension, anxiety, insomnia, depression and left parietal infarct in 2015 with no residual deficit. Pt agitated this date upon learning limited visits and insurance, and was fatigued d/t moving into a new house, this negatively affected participation in tx. Pt at this time would benefit from continued skilled services to improve ability to complete ADL/IADL as able.  PERFORMANCE DEFICITS: in functional skills including IADLs, coordination, dexterity, proprioception, sensation, edema, strength, and Fine motor control, cognitive skills including attention, emotional, safety awareness, and sequencing, and psychosocial skills including coping strategies, environmental adaptation, and routines and behaviors.   IMPAIRMENTS: are  limiting patient from IADLs, rest and sleep, and leisure.   CO-MORBIDITIES: has co-morbidities such as HTN that affects occupational performance. Patient will benefit from skilled OT to address above impairments and improve overall function.  MODIFICATION OR ASSISTANCE TO COMPLETE EVALUATION: Min-Moderate modification of tasks or assist with assess necessary to complete an evaluation.  OT OCCUPATIONAL PROFILE AND HISTORY: Detailed assessment: Review of records and additional review of physical, cognitive, psychosocial history related to current functional performance.  CLINICAL DECISION MAKING: Moderate - several treatment options, min-mod task modification necessary  REHAB POTENTIAL: Good  EVALUATION COMPLEXITY: Moderate    PLAN:  OT FREQUENCY: 1x/week  OT DURATION: 8 weeks  PLANNED INTERVENTIONS: 97168 OT Re-evaluation, 97535 self care/ADL training, 02889 therapeutic exercise, 97530 therapeutic activity, 97112 neuromuscular re-education, 97140 manual therapy, 97035 ultrasound, 97018 paraffin, 02960 fluidotherapy, 97010 moist heat, 97010 cryotherapy, 97034 contrast bath, 97032 electrical stimulation (manual), 97760 Orthotic Initial, 97763 Orthotic/Prosthetic subsequent, passive range of motion, functional mobility training, psychosocial skills training, energy conservation, coping strategies training, patient/family education, and DME and/or AE instructions  RECOMMENDED OTHER SERVICES: none at this time  CONSULTED AND AGREED WITH PLAN OF CARE: Patient  PLAN FOR NEXT SESSION: Re-assess measurements Continued Fm coordination/grip strength UEFS  Rocky Dutch, OT 10/01/2024, 4:43 PM

## 2024-10-02 ENCOUNTER — Other Ambulatory Visit: Payer: Self-pay | Admitting: Obstetrics and Gynecology

## 2024-10-02 DIAGNOSIS — Z1231 Encounter for screening mammogram for malignant neoplasm of breast: Secondary | ICD-10-CM

## 2024-10-08 ENCOUNTER — Encounter

## 2024-10-15 ENCOUNTER — Ambulatory Visit: Attending: Internal Medicine

## 2024-10-16 ENCOUNTER — Encounter

## 2024-10-22 ENCOUNTER — Ambulatory Visit: Attending: Internal Medicine

## 2024-10-22 DIAGNOSIS — R208 Other disturbances of skin sensation: Secondary | ICD-10-CM | POA: Insufficient documentation

## 2024-10-22 DIAGNOSIS — R278 Other lack of coordination: Secondary | ICD-10-CM | POA: Insufficient documentation

## 2024-10-22 DIAGNOSIS — R29898 Other symptoms and signs involving the musculoskeletal system: Secondary | ICD-10-CM | POA: Insufficient documentation

## 2024-10-22 DIAGNOSIS — M6281 Muscle weakness (generalized): Secondary | ICD-10-CM | POA: Diagnosis present

## 2024-10-22 NOTE — Therapy (Signed)
 OUTPATIENT OCCUPATIONAL THERAPY NEURO TREATMENT AND DISCHARGE  Patient Name: Victoria Guzman MRN: [id number] DOB:06/20/1966, 58 y.o., female Today's Date: 10/22/2024  PCP: Rudolpho Norleen BIRCH, MD REFERRING PROVIDER: Rudolpho Norleen BIRCH, MD  END OF SESSION:  OT End of Session - 10/22/24 1712     Visit Number 5    Date for Recertification  11/03/24    Authorization Type [employer] MCR    Authorization Time Period 09/03/24-10/29/24    Authorization - Visit Number 4    Authorization - Number of Visits 6    Progress Note Due on Visit 6    OT Start Time 1543    OT Stop Time 1621    OT Time Calculation (min) 38 min        OCCUPATIONAL THERAPY DISCHARGE SUMMARY  Visits from Start of Care: From eval 09/03/24-10/22/24 pt received 5 visits including eval  Current functional level related to goals / functional outcomes: Pt met 2/3 STGs, 1/4 LTGs   Remaining deficits: Slight deficit in RUE FM coordination and grip strength   Education / Equipment: Educated in HEPs for improved grip and coordination, sleep positioning, tendon glides, AE and where to obtain for increased ease in IADL   Patient agrees to discharge. Patient goals were partially met. Patient is being discharged due to the patient's request..        Past Medical History:  Diagnosis Date   Anemia    Anxiety    Chronic fatigue    Chronic insomnia 05/31/2016   Depression    Family history of adverse reaction to anesthesia    mother difficult to wake up after CABG   Fibromyalgia    Hypertension    Insomnia    Panic attack    Stroke [facility])    tia   Vitamin D  deficiency 05/31/2016   Past Surgical History:  Procedure Laterality Date   ABDOMINAL HYSTERECTOMY     COLONOSCOPY WITH PROPOFOL  N/A 07/24/2018   Procedure: COLONOSCOPY WITH PROPOFOL ;  Surgeon: Toledo, Ladell POUR, MD;  Location: [facility] ENDOSCOPY;  Service: Gastroenterology;  Laterality: N/A;   ESOPHAGOGASTRODUODENOSCOPY (EGD) WITH PROPOFOL  N/A 07/24/2018   Procedure:  ESOPHAGOGASTRODUODENOSCOPY (EGD) WITH PROPOFOL ;  Surgeon: Toledo, Ladell POUR, MD;  Location: [facility] ENDOSCOPY;  Service: Gastroenterology;  Laterality: N/A;   LAPAROSCOPIC BILATERAL SALPINGECTOMY Bilateral 01/25/2019   Procedure: LAPAROSCOPIC BILATERAL SALPINGECTOMY;  Surgeon: Schermerhorn, Debby PARAS, MD;  Location: [facility] ORS;  Service: Gynecology;  Laterality: Bilateral;   NASAL TURBINATE REDUCTION Bilateral 06/14/2022   Procedure: INFERIOR TURBINATE REDUCTION;  Surgeon: Edda Mt, MD;  Location: [facility];  Service: ENT;  Laterality: Bilateral;   PARATHYROIDECTOMY     PARTIAL HYSTERECTOMY     SEPTOPLASTY N/A 06/14/2022   Procedure: SEPTOPLASTY;  Surgeon: Edda Mt, MD;  Location: [facility];  Service: ENT;  Laterality: N/A;   TRACHELECTOMY N/A 01/25/2019   Procedure: TRACHELECTOMY, laparoscopic cervical;  Surgeon: Lovetta Debby PARAS, MD;  Location: [facility] ORS;  Service: Gynecology;  Laterality: N/A;   Patient Active Problem List   Diagnosis Date Noted   Acute ischemic stroke (HCC) 03/27/2024   Influenza A 11/13/2022   Acute respiratory failure with hypoxia (HCC) 11/13/2022   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) 11/13/2022   History of CVA (cerebrovascular accident) 11/13/2022   Elevated troponin 11/13/2022   Acute respiratory failure with hypoxemia (HCC) 11/13/2022   Post-operative state 01/25/2019   ARF (acute renal failure) 04/24/2018   Acute renal failure (ARF) 04/22/2018   Hypercalcemia 05/31/2016   Hypokalemia 05/31/2016  Hypochloremia 05/31/2016   Screening for HIV (human immunodeficiency virus) 05/31/2016   Screen for STD (sexually transmitted disease) 05/31/2016   Vitamin D  deficiency 05/31/2016   Chronic insomnia 05/31/2016   Absolute anemia 07/07/2015   Anxiety and depression 07/07/2015   Essential hypertension 07/07/2015   Acute anxiety 07/07/2015   Primary fibromyalgia syndrome 07/07/2015   Embolic stroke (HCC) 07/17/2014    ONSET DATE:  07/29/2024 referral date, ED date 03/26/24-03/28/24  REFERRING DIAG: Z86.73 (ICD-10-CM) - History of CVA (cerebrovascular accident)  THERAPY DIAG:  Other lack of coordination  Muscle weakness (generalized)  Other symptoms and signs involving the musculoskeletal system  Other disturbances of skin sensation  Rationale for Evaluation and Treatment: Rehabilitation  SUBJECTIVE:   SUBJECTIVE STATEMENT: Pt reports fatigue with moving, is in process of moving into her own house. Pt accompanied by: self  PERTINENT HISTORY: Pt is a 58 yo female, went to ED on 03/26/24, was in [place] reported feeling hot so went to hospital and was told she had an ischemic stroke and needed surgery, d/t cost went to [facility] ED, reported symptoms had started over 30 hours prior to ED arrival, presented with difficulty speaking. MRI with acute ischemic infarcts.   PRECAUTIONS: Fall R sided weakness, HTN, bleeding precautions  WEIGHT BEARING RESTRICTIONS: No  PAIN:  Are you having pain? 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 No  LIVING ENVIRONMENT: Lives with: lives with their family Lives in: House/apartment Stairs: Yes: Internal: 14 steps; can reach both Has following equipment at home: None  PLOF: Independent, was on disability  PATIENT GOALS: I want to improve the function in the hand and my elbows are hurting,   OBJECTIVE:  Note: Objective measures were completed at Evaluation unless otherwise noted.  HAND DOMINANCE: Right  ADLs: Overall ADLs: Overall l Transfers/ambulation related to ADLs: Eating: Independent, but it feels awkward at times  Grooming:  Independent, but it feels awkward at times  UB Dressing: Independent LB Dressing: Independent Toileting: Independent Bathing: Independent but it feels awkward at times. I don't do it as good  as I could with my right hand Tub Shower transfers: Independent, shower/tub combo  Equipment: none  IADLs: Shopping: Independent, takes time Light housekeeping: Independent, takes time Meal Prep: chopping vegetables is very hard, takes a lot of time but can do meal prep Community mobility: Independent Medication management: Independent, takes time Financial management: Independent, takes time Handwriting: 90% legible  MOBILITY STATUS: Independent  POSTURE COMMENTS:  No Significant postural limitations Sitting balance: WNL   ACTIVITY TOLERANCE: Activity tolerance: Pt requires increased time to complete tasks  FUNCTIONAL OUTCOME MEASURES: PSFS:   10/22/24:  UPPER EXTREMITY ROM:  WNL  Active ROM Right eval Left eval  Shoulder flexion    Shoulder abduction    Shoulder adduction    Shoulder extension    Shoulder internal rotation    Shoulder external rotation    Elbow flexion    Elbow extension    Wrist flexion    Wrist extension    Wrist ulnar deviation    Wrist radial deviation    Wrist pronation    Wrist supination    (Blank rows = not tested)  UPPER EXTREMITY MMT:     MMT Right eval Left eval  Shoulder flexion    Shoulder abduction    Shoulder adduction    Shoulder extension    Shoulder  internal rotation    Shoulder external rotation    Middle trapezius    Lower trapezius    Elbow flexion    Elbow extension    Wrist flexion    Wrist extension    Wrist ulnar deviation    Wrist radial deviation    Wrist pronation    Wrist supination    (Blank rows = not tested)  HAND FUNCTION: Grip strength: Right: 31.7 lbs; Left: 54.2 lbs  COORDINATION: 9 Hole Peg test: Right: 28.65 sec; Left: 20.78 sec Box and Blocks:  Right 46blocks, Left 58blocks (noted approximately 3 times pt did place 2 blocks at a time, stopped with a verbal cue)  SENSATION:  sometimes numbness and tingling, reports some difference in temperature between each UE (R hand runs cooler  than the L hand)  EDEMA: occasional swelling in hands.   MUSCLE TONE: RUE: Rigidity and Hypertonic  COGNITION: Overall cognitive status: Within functional limits for tasks assessed  VISION: Subjective report: Sometimes blurriness, sometimes hard to see small print Baseline vision: No visual deficits Visual history: none  VISION ASSESSMENT: WFL  Patient has difficulty with following activities due to following visual impairments: none  PERCEPTION: Not tested, further assess in functional context  PRAXIS: Impaired: Ideation  OBSERVATIONS: impaired FM coordination and hand strength in dominant R hand, concerns with sensation                                                                                                                             TREATMENT DATE: 10/22/24  Pt arrived agitated and tearful, stating I can't do therapy anymore right now, I have a lot going on and I can't do the 20$ copay right now.   Re-assessed tests this date. Pt reports some numbness and tingling in LUE, educated in sleep positioning, tendon glides, and encouraged to try to use the R hand more in functional tasks as appropriate to reduce strain on L side. Pt agreed.    PATIENT EDUCATION: Education details: SEE ABOVE Person educated: Patient Education method: Explanation, Demonstration, and Handouts Education comprehension: verbalized understanding and returned demonstration  HOME EXERCISE PROGRAM: 09/10/24: Coordination (see pt handout) 09/17/24: [place] band exercises (ACCESS CODE:  [id number])   GOALS: Goals reviewed with patient? Yes    SHORT TERM GOALS: Target date: 10/03/24  Pt will be independent with R handed HEP for FM coordination and strength Baseline: To be established  Goal status: MET  2.  Pt will independently verbalize sensory precautions secondary to impaired sensation in R hand with 100% accuracy Baseline: To be educated  Goal status: NOT MET   3.  Pt will  demonstrate improved FM coordination by a score of 50 blocks or more with R hand in Box and blocks test  Baseline: 42 R hand, 58 L hand 10/22/24: 51 blocks R hand Goal status: MET    LONG TERM GOALS: Target date: 11/03/24  Pt will increase R grip strength by at least  15 pounds for improved functional use  Baseline: 31.7 lbs 10/22/24: 31.3 Goal status: NOT MET   2.  Pt will demonstrate improved FM coordination by a score of 23 seconds or less in R hand for 9 hole peg test  Baseline: 28.65 seconds  10/22/24: 29.53 seconds  Goal status: NOT MET   3. OT will assess function of RUE through UEFS and create goal as appropriate Baseline: To be completed d/t time constraints  Goal status: DISCONTINUE  4.  Pt will report increased ease of cutting foods for meal prep with use of AE as needed Baseline: Pt reports requiring increased time to cut foods and that it feels weird in both hands to do so. 10/22/24: Noted 2 point increase in this activity per PSFS (see Functional Outcome Measures) Goal status: MET     ASSESSMENT:  CLINICAL IMPRESSION: Patient is a 58 y.o. female who was seen today for occupational therapy tx for s/p CVA. Hx includes hypertension, anxiety, insomnia, depression and left parietal infarct in 2015 with no residual deficit. Pt agitated this date upon learning limited visits and insurance, and was fatigued d/t moving into a new house, this negatively affected participation in tx. Pt at this time would benefit from continued skilled services to improve ability to complete ADL/IADL as able.  PERFORMANCE DEFICITS: in functional skills including IADLs, coordination, dexterity, proprioception, sensation, edema, strength, and Fine motor control, cognitive skills including attention, emotional, safety awareness, and sequencing, and psychosocial skills including coping strategies, environmental adaptation, and routines and behaviors.   IMPAIRMENTS: are limiting patient from  IADLs, rest and sleep, and leisure.   CO-MORBIDITIES: has co-morbidities such as HTN that affects occupational performance. Patient will benefit from skilled OT to address above impairments and improve overall function.  MODIFICATION OR ASSISTANCE TO COMPLETE EVALUATION: Min-Moderate modification of tasks or assist with assess necessary to complete an evaluation.  OT OCCUPATIONAL PROFILE AND HISTORY: Detailed assessment: Review of records and additional review of physical, cognitive, psychosocial history related to current functional performance.  CLINICAL DECISION MAKING: Moderate - several treatment options, min-mod task modification necessary  REHAB POTENTIAL: Good  EVALUATION COMPLEXITY: Moderate     Lexie Morini, OT 10/22/2024, 5:13 PM

## 2024-10-22 NOTE — Patient Instructions (Addendum)
   WEBSITE: [email or web address]

## 2024-10-25 ENCOUNTER — Ambulatory Visit
Admission: RE | Admit: 2024-10-25 | Discharge: 2024-10-25 | Disposition: A | Discharge status: Home / Self Care | Source: Ambulatory Visit | Attending: Obstetrics and Gynecology | Admitting: Obstetrics and Gynecology

## 2024-10-25 DIAGNOSIS — Z1231 Encounter for screening mammogram for malignant neoplasm of breast: Secondary | ICD-10-CM | POA: Insufficient documentation

## 2024-10-29 ENCOUNTER — Ambulatory Visit

## 2024-12-17 ENCOUNTER — Ambulatory Visit
Admission: RE | Admit: 2024-12-17 | Discharge: 2024-12-17 | Disposition: A | Attending: Internal Medicine | Admitting: Internal Medicine

## 2024-12-17 ENCOUNTER — Other Ambulatory Visit: Payer: Self-pay

## 2024-12-17 ENCOUNTER — Ambulatory Visit: Admitting: Anesthesiology

## 2024-12-17 ENCOUNTER — Encounter
Admission: RE | Disposition: A | Discharge status: Home / Self Care | Payer: Self-pay | Source: Home / Self Care | Attending: Internal Medicine

## 2024-12-17 DIAGNOSIS — M797 Fibromyalgia: Secondary | ICD-10-CM | POA: Insufficient documentation

## 2024-12-17 DIAGNOSIS — Z8673 Personal history of transient ischemic attack (TIA), and cerebral infarction without residual deficits: Secondary | ICD-10-CM | POA: Insufficient documentation

## 2024-12-17 DIAGNOSIS — K921 Melena: Secondary | ICD-10-CM | POA: Diagnosis present

## 2024-12-17 DIAGNOSIS — F419 Anxiety disorder, unspecified: Secondary | ICD-10-CM | POA: Insufficient documentation

## 2024-12-17 DIAGNOSIS — K640 First degree hemorrhoids: Secondary | ICD-10-CM | POA: Diagnosis not present

## 2024-12-17 DIAGNOSIS — I10 Essential (primary) hypertension: Secondary | ICD-10-CM | POA: Diagnosis not present

## 2024-12-17 DIAGNOSIS — K6289 Other specified diseases of anus and rectum: Secondary | ICD-10-CM | POA: Diagnosis not present

## 2024-12-17 DIAGNOSIS — K6389 Other specified diseases of intestine: Secondary | ICD-10-CM | POA: Insufficient documentation

## 2024-12-17 HISTORY — PX: COLONOSCOPY: SHX5424

## 2024-12-17 HISTORY — PX: BIOPSY OF SKIN SUBCUTANEOUS TISSUE AND/OR MUCOUS MEMBRANE: SHX6741

## 2024-12-17 SURGERY — COLONOSCOPY
Anesthesia: General

## 2024-12-17 MED ORDER — PROPOFOL 500 MG/50ML IV EMUL
INTRAVENOUS | Status: DC | PRN
  Administered 2024-12-17: 75 ug/kg/min via INTRAVENOUS

## 2024-12-17 MED ORDER — SODIUM CHLORIDE 0.9 % IV SOLN: INTRAVENOUS | Status: DC

## 2024-12-17 MED ORDER — LIDOCAINE HCL (PF) 2 % IJ SOLN
INTRAMUSCULAR | Status: AC
  Filled 2024-12-17: qty 5

## 2024-12-17 MED ORDER — LIDOCAINE HCL (CARDIAC) PF 100 MG/5ML IV SOSY
PREFILLED_SYRINGE | INTRAVENOUS | Status: DC | PRN
  Administered 2024-12-17: 60 mg via INTRAVENOUS

## 2024-12-17 MED ORDER — PROPOFOL 10 MG/ML IV BOLUS
INTRAVENOUS | Status: DC | PRN
  Administered 2024-12-17 (×3): 50 mg via INTRAVENOUS

## 2024-12-17 MED ORDER — DEXMEDETOMIDINE HCL IN NACL 80 MCG/20ML IV SOLN
INTRAVENOUS | Status: DC | PRN
  Administered 2024-12-17: 12 ug via INTRAVENOUS
  Administered 2024-12-17: 8 ug via INTRAVENOUS

## 2024-12-17 NOTE — Transfer of Care (Signed)
 Immediate Anesthesia Transfer of Care Note  Patient: Victoria Guzman  Procedure(s) Performed: COLONOSCOPY  Patient Location: PACU  Anesthesia Type:General  Level of Consciousness: sedated  Airway & Oxygen Therapy: Patient Spontanous Breathing  Post-op Assessment: Report given to RN and Post -op Vital signs reviewed and stable  Post vital signs: Reviewed and stable  Last Vitals:  Vitals Value Taken Time  BP 133/97 12/17/24 14:54  Temp 35.8 C 12/17/24 14:53  Pulse 104 12/17/24 14:55  Resp 15 12/17/24 14:55  SpO2 100 % 12/17/24 14:55  Vitals shown include unfiled device data.  Last Pain:  Vitals:   12/17/24 1453  TempSrc: Temporal  PainSc:          Complications: No notable events documented.

## 2024-12-17 NOTE — H&P (Signed)
 Outpatient short stay form Pre-procedure 12/17/2024 1:22 PM Mahamed Zalewski K. Aundria, M.D.  Primary Physician: Norleen Rower, M.D.  Reason for visit:    History of present illness:  Victoria Guzman is a 59 year old female who presents for evaluation of a hemoccult positive stool test.  Hemoccult positive stool test was obtained on 10/23/2024. She has had no abdominal pain, change in bowel habits, overt gastrointestinal bleeding, melena, black tarry stools, nausea, vomiting, heartburn, reflux, or dysphagia. Weight is stable.  Colonoscopy in 2019 showed internal hemorrhoids and was otherwise normal. She has a longstanding small palpable lesion at the anal verge, thought to be a hemorrhoid, with intermittent pain and paper cut sensation only with repeated wiping. Prior exams, including colonoscopy, did not visualize a lesion. Wet wipes and hemorrhoid pads give partial relief. She occasionally sees a small amount of blood on tissue with repeated wiping.  Constipation began in childhood. She takes an herbal supplement daily, with rare missed doses. She has one to two soft bowel movements per day that pass easily without straining. She denies chronic straining or visible blood in the stool.  Upper endoscopy in 2019 included esophageal dilation. She denies dysphagia before or since and does not recall the indication.  GI related family history: denies Denies regular nsaid, endorses alcohol use (7 drinks/week), denies tobacco use. Denies prior abdominal surgeries.  Denies anticoagulation, recent cardiac events, recent hospitalizations, use of CPAP or supplemental oxygen, or prior issues with sedation.   Prior scopes:07/2018- [facility] none obstructing Schatzki ring dilated to 50fr Colonoscopy IH, otherwise normal, 10 year repeat recommended   Denies fever, chills, chest pain, or shortness of breath.    Current Medications[1]  Medications Prior to Admission  Medication Sig Dispense Refill  Last Dose/Taking   acetaminophen  (TYLENOL ) 650 MG CR tablet Take 1,300 mg by mouth every 8 (eight) hours as needed for pain.      albuterol  (PROAIR  HFA) 108 (90 Base) MCG/ACT inhaler Inhale 2 puffs into the lungs every 4 (four) hours as needed for wheezing or shortness of breath. 1 each 2    aspirin  EC 81 MG tablet Take 1 tablet (81 mg total) by mouth daily. Swallow whole. 30 tablet 11    atorvastatin  (LIPITOR) 40 MG tablet Take 1 tablet (40 mg total) by mouth daily. 90 tablet 0    clonazePAM  (KLONOPIN ) 1 MG tablet Take 1 mg by mouth at bedtime.      lisinopril -hydrochlorothiazide  (PRINZIDE ,ZESTORETIC ) 20-12.5 MG tablet Take 1 tablet by mouth 2 (two) times daily.  1    oxyCODONE -acetaminophen  (PERCOCET) 10-325 MG tablet Take 1 tablet by mouth every 8 (eight) hours as needed for pain.      promethazine  (PHENERGAN ) 25 MG tablet Take 25 mg by mouth every 8 (eight) hours as needed for nausea or vomiting.      tiZANidine  (ZANAFLEX ) 4 MG tablet Take 4 mg by mouth at bedtime.      zolpidem  (AMBIEN ) 10 MG tablet Take 10 mg by mouth at bedtime.  1      Allergies[2]   Past Medical History:  Diagnosis Date   Anemia    Anxiety    Chronic fatigue    Chronic insomnia 05/31/2016   Depression    Family history of adverse reaction to anesthesia    mother difficult to wake up after CABG   Fibromyalgia    Hypertension    Insomnia    Panic attack    Stroke (HCC)    tia   Vitamin D   deficiency 05/31/2016    Review of systems:  Otherwise negative.    Physical Exam  Gen: Alert, oriented. Appears stated age.  HEENT: [place]/AT. PERRLA. Lungs: CTA, no wheezes. CV: RR nl S1, S2. Abd: soft, benign, no masses. BS+ Ext: No edema. Pulses 2+    Planned procedures: Proceed with colonoscopy. The patient understands the nature of the planned procedure, indications, risks, alternatives and potential complications including but not limited to bleeding, infection, perforation, damage to internal organs and  possible oversedation/side effects from anesthesia. The patient agrees and gives consent to proceed.  Please refer to procedure notes for findings, recommendations and patient disposition/instructions.     Curley Hogen K. Aundria, M.D. Gastroenterology 12/17/2024  1:22 PM          [1]  Current Facility-Administered Medications:    0.9 %  sodium chloride  infusion, , Intravenous, Continuous, Daneka Lantigua K, MD [2]  Allergies Allergen Reactions   Iodinated Contrast Media Cough    Mild hypoxia, wheezing, throat edema feeling shortly after contrast 03/27/24. Improved without treatment, patient doesn't think it was an allergic reaction and refused all meds. Did improve without them.    Clonidine  And Derivatives Hives   Quinolones Hives and Swelling   Trazodone Other (See Comments)    Caused sedation. Felt depressed while on medication.   Zofran  [Ondansetron  Hcl] Itching, Swelling and Other (See Comments)    swelling of lips

## 2024-12-17 NOTE — Interval H&P Note (Signed)
 History and Physical Interval Note:  12/17/2024 1:45 PM  Victoria Guzman  has presented today for surgery, with the diagnosis of Colon cancer screening (Z12.11) Positive fecal occult blood test (R19.5).  The various methods of treatment have been discussed with the patient and family. After consideration of risks, benefits and other options for treatment, the patient has consented to  Procedures with comments: COLONOSCOPY (N/A) - WANTS AN AFTERNOON APPOINTMENT AS LATE AS POSSIBLE as a surgical intervention.  The patient's history has been reviewed, patient examined, no change in status, stable for surgery.  I have reviewed the patient's chart and labs.  Questions were answered to the patient's satisfaction.     [place], Brevin Mcfadden

## 2024-12-17 NOTE — Op Note (Signed)
 [facility] Gastroenterology Patient Name: Victoria Guzman Procedure Date: 12/17/2024 2:28 PM MRN: [id number] Account #: [id number] Date of Birth: [birth year] Admit Type: Outpatient Age: 59 Room: [facility] ENDO ROOM 1 Gender: Female Note Status: Finalized Instrument Name: Colon Scope [id number] Procedure:             Colonoscopy Indications:           Heme positive stool Providers:             Karlton Maya K. Hyland Mollenkopf MD, MD Medicines:             Propofol  per Anesthesia Complications:         No immediate complications. Estimated blood loss:                         Minimal. Procedure:             Pre-Anesthesia Assessment:                        - The risks and benefits of the procedure and the                         sedation options and risks were discussed with the                         patient. All questions were answered and informed                         consent was obtained.                        - Patient identification and proposed procedure were                         verified prior to the procedure by the nurse. The                         procedure was verified in the procedure room.                        - ASA Grade Assessment: II - A patient with mild                         systemic disease.                        - After reviewing the risks and benefits, the patient                         was deemed in satisfactory condition to undergo the                         procedure.                        After obtaining informed consent, the colonoscope was                         passed under direct vision. Throughout the procedure,  the patient's blood pressure, pulse, and oxygen                         saturations were monitored continuously. The                         Colonoscope was introduced through the anus and                         advanced to the the cecum, identified by appendiceal                         orifice and  ileocecal valve. The colonoscopy was                         performed without difficulty. The patient tolerated                         the procedure well. The quality of the bowel                         preparation was adequate. The ileocecal valve,                         appendiceal orifice, and rectum were photographed. Findings:      The perianal and digital rectal examinations were normal. Pertinent       negatives include normal sphincter tone and no palpable rectal lesions.      A diffuse area of mild melanosis was found in the entire colon. Two       biopsies were obtained with cold forceps for histology in the cecum.       Estimated blood loss was minimal.      Anal papilla(e) were hypertrophied.      Non-bleeding internal hemorrhoids were found during retroflexion. The       hemorrhoids were Grade I (internal hemorrhoids that do not prolapse).      The exam was otherwise without abnormality. Impression:            - Melanosis in the colon.                        - Anal papilla(e) were hypertrophied.                        - Non-bleeding internal hemorrhoids.                        - The examination was otherwise normal.                        - Biopsies performed in the cecum. Recommendation:        - Patient has a contact number available for                         emergencies. The signs and symptoms of potential                         delayed complications were discussed with the patient.  Return to normal activities tomorrow. Written                         discharge instructions were provided to the patient.                        - Resume previous diet.                        - Continue present medications.                        - Await pathology results.                        - Repeat colonoscopy in 10 years for screening                         purposes.                        - Return to GI office PRN.                        - The  findings and recommendations were discussed with                         the patient. Procedure Code(s):     --- Professional ---                        [phone], Colonoscopy, flexible; with biopsy, single or                         multiple Diagnosis Code(s):     --- Professional ---                        R19.5, Other fecal abnormalities                        K64.0, First degree hemorrhoids                        K62.89, Other specified diseases of anus and rectum                        K63.89, Other specified diseases of intestine CPT copyright 2022 American Medical Association. All rights reserved. The codes documented in this report are preliminary and upon coder review may  be revised to meet current compliance requirements. Ladell MARLA Boss MD, MD 12/17/2024 2:56:01 PM This report has been signed electronically. Number of Addenda: 0 Note Initiated On: 12/17/2024 2:28 PM Scope Withdrawal Time: 0 hours 4 minutes 59 seconds  Total Procedure Duration: 0 hours 10 minutes 8 seconds  Estimated Blood Loss:  Estimated blood loss was minimal.      [facility]

## 2024-12-17 NOTE — Anesthesia Preprocedure Evaluation (Signed)
 "                                  Anesthesia Evaluation  Patient identified by MRN, date of birth, ID band Patient awake    Reviewed: Allergy & Precautions, NPO status , Patient's Chart, lab work & pertinent test results  History of Anesthesia Complications Negative for: history of anesthetic complications  Airway Mallampati: III  TM Distance: >3 FB Neck ROM: full    Dental no notable dental hx.    Pulmonary neg pulmonary ROS   Pulmonary exam normal        Cardiovascular hypertension, On Medications negative cardio ROS Normal cardiovascular exam     Neuro/Psych  PSYCHIATRIC DISORDERS Anxiety Depression    TIA Neuromuscular disease    GI/Hepatic negative GI ROS, Neg liver ROS,,,  Endo/Other  negative endocrine ROS    Renal/GU CRFRenal disease  negative genitourinary   Musculoskeletal  (+)  Fibromyalgia -  Abdominal   Peds  Hematology  (+) Blood dyscrasia, anemia   Anesthesia Other Findings Past Medical History: No date: Anemia No date: Anxiety No date: Chronic fatigue 05/31/2016: Chronic insomnia No date: Depression No date: Family history of adverse reaction to anesthesia     Comment:  mother difficult to wake up after CABG No date: Fibromyalgia No date: Hypertension No date: Insomnia No date: Panic attack No date: Stroke [facility])     Comment:  tia 05/31/2016: Vitamin D  deficiency  Past Surgical History: No date: ABDOMINAL HYSTERECTOMY 07/24/2018: COLONOSCOPY WITH PROPOFOL ; N/A     Comment:  Procedure: COLONOSCOPY WITH PROPOFOL ;  Surgeon: Toledo,               Ladell POUR, MD;  Location: [facility];  Service:               Gastroenterology;  Laterality: N/A; 07/24/2018: ESOPHAGOGASTRODUODENOSCOPY (EGD) WITH PROPOFOL ; N/A     Comment:  Procedure: ESOPHAGOGASTRODUODENOSCOPY (EGD) WITH               PROPOFOL ;  Surgeon: Toledo, Ladell POUR, MD;  Location:               [facility];  Service: Gastroenterology;  Laterality:                N/A; 01/25/2019: LAPAROSCOPIC BILATERAL SALPINGECTOMY; Bilateral     Comment:  Procedure: LAPAROSCOPIC BILATERAL SALPINGECTOMY;                Surgeon: Schermerhorn, Debby PARAS, MD;  Location: [facility];              Service: Gynecology;  Laterality: Bilateral; 06/14/2022: NASAL TURBINATE REDUCTION; Bilateral     Comment:  Procedure: INFERIOR TURBINATE REDUCTION;  Surgeon:               Edda Mt, MD;  Location: [facility];                Service: ENT;  Laterality: Bilateral; No date: PARATHYROIDECTOMY No date: PARTIAL HYSTERECTOMY 06/14/2022: SEPTOPLASTY; N/A     Comment:  Procedure: SEPTOPLASTY;  Surgeon: Edda Mt, MD;                Location: [facility];  Service: ENT;                Laterality: N/A; 01/25/2019: TRACHELECTOMY; N/A     Comment:  Procedure: TRACHELECTOMY, laparoscopic cervical;  Surgeon: Schermerhorn, Debby PARAS, MD;  Location: [facility];              Service: Gynecology;  Laterality: N/A;     Reproductive/Obstetrics negative OB ROS                              Anesthesia Physical Anesthesia Plan  ASA: 3  Anesthesia Plan: General   Post-op Pain Management: Minimal or no pain anticipated   Induction: Intravenous  PONV Risk Score and Plan: 2 and Propofol  infusion and TIVA  Airway Management Planned: Natural Airway and Nasal Cannula  Additional Equipment:   Intra-op Plan:   Post-operative Plan:   Informed Consent: I have reviewed the patients History and Physical, chart, labs and discussed the procedure including the risks, benefits and alternatives for the proposed anesthesia with the patient or authorized representative who has indicated his/her understanding and acceptance.     Dental Advisory Given  Plan Discussed with: Anesthesiologist, CRNA and Surgeon  Anesthesia Plan Comments: (Patient consented for risks of anesthesia including but not limited to:  - adverse reactions to  medications - risk of airway placement if required - damage to eyes, teeth, lips or other oral mucosa - nerve damage due to positioning  - sore throat or hoarseness - Damage to heart, brain, nerves, lungs, other parts of body or loss of life  Patient voiced understanding and assent.)        Anesthesia Quick Evaluation  "

## 2024-12-17 NOTE — Anesthesia Postprocedure Evaluation (Signed)
"   Anesthesia Post Note  Patient: Victoria Guzman  Procedure(s) Performed: COLONOSCOPY BIOPSY, GI  Patient location during evaluation: Endoscopy Anesthesia Type: General Level of consciousness: awake and alert Pain management: pain level controlled Vital Signs Assessment: post-procedure vital signs reviewed and stable Respiratory status: spontaneous breathing, nonlabored ventilation, respiratory function stable and patient connected to nasal cannula oxygen Cardiovascular status: blood pressure returned to baseline and stable Postop Assessment: no apparent nausea or vomiting Anesthetic complications: no   No notable events documented.   Last Vitals:  Vitals:   12/17/24 1503 12/17/24 1513  BP: (!) 144/85 129/84  Pulse: 80 73  Resp: 13 15  Temp:    SpO2: 100% 100%    Last Pain:  Vitals:   12/17/24 1453  TempSrc: Temporal  PainSc:                  Victoria Guzman      "

## 2024-12-18 ENCOUNTER — Encounter: Payer: Self-pay | Admitting: Internal Medicine

## 2024-12-18 LAB — SURGICAL PATHOLOGY

## 2024-12-30 ENCOUNTER — Ambulatory Visit: Admitting: Adult Health

## 2025-02-05 ENCOUNTER — Ambulatory Visit: Admitting: Adult Health
